# Patient Record
Sex: Female | Born: 1995 | Race: White | Hispanic: No | Marital: Married | Smoking: Never smoker
Health system: Southern US, Community
[De-identification: ages and names within clinical notes are randomized; demographics above are authoritative.]

## PROBLEM LIST (undated history)

## (undated) ENCOUNTER — Inpatient Hospital Stay (HOSPITAL_COMMUNITY): Payer: Self-pay

## (undated) DIAGNOSIS — F419 Anxiety disorder, unspecified: Secondary | ICD-10-CM

## (undated) DIAGNOSIS — N809 Endometriosis, unspecified: Secondary | ICD-10-CM

## (undated) DIAGNOSIS — O24419 Gestational diabetes mellitus in pregnancy, unspecified control: Secondary | ICD-10-CM

## (undated) DIAGNOSIS — H6690 Otitis media, unspecified, unspecified ear: Secondary | ICD-10-CM

## (undated) DIAGNOSIS — F32A Depression, unspecified: Secondary | ICD-10-CM

## (undated) DIAGNOSIS — R569 Unspecified convulsions: Secondary | ICD-10-CM

## (undated) DIAGNOSIS — G709 Myoneural disorder, unspecified: Secondary | ICD-10-CM

## (undated) DIAGNOSIS — T7840XA Allergy, unspecified, initial encounter: Secondary | ICD-10-CM

## (undated) HISTORY — DX: Allergy, unspecified, initial encounter: T78.40XA

## (undated) HISTORY — DX: Gestational diabetes mellitus in pregnancy, unspecified control: O24.419

## (undated) HISTORY — DX: Unspecified convulsions: R56.9

## (undated) HISTORY — PX: TONSILLECTOMY: SUR1361

## (undated) HISTORY — DX: Anxiety disorder, unspecified: F41.9

## (undated) HISTORY — DX: Otitis media, unspecified, unspecified ear: H66.90

## (undated) HISTORY — PX: CHOLECYSTECTOMY: SHX55

## (undated) HISTORY — DX: Myoneural disorder, unspecified: G70.9

## (undated) HISTORY — DX: Depression, unspecified: F32.A

---
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
## 2013-01-19 ENCOUNTER — Emergency Department: Payer: Self-pay | Admitting: Emergency Medicine

## 2013-01-19 LAB — URINALYSIS, COMPLETE
Blood: NEGATIVE
Glucose,UR: NEGATIVE mg/dL (ref 0–75)
Leukocyte Esterase: NEGATIVE
RBC,UR: NONE SEEN /HPF (ref 0–5)
Squamous Epithelial: <1

## 2013-01-19 LAB — COMPREHENSIVE METABOLIC PANEL
Albumin: 3.4 g/dL — ABNORMAL LOW (ref 3.8–5.6)
BUN: 4 mg/dL — ABNORMAL LOW (ref 9–21)
Bilirubin,Total: 0.4 mg/dL (ref 0.2–1.0)
Calcium, Total: 8.7 mg/dL — ABNORMAL LOW (ref 9.0–10.7)
Chloride: 104 mmol/L (ref 97–107)
Co2: 27 mmol/L — ABNORMAL HIGH (ref 16–25)
Creatinine: 0.48 mg/dL — ABNORMAL LOW (ref 0.60–1.30)
Glucose: 99 mg/dL (ref 65–99)
Osmolality: 269 (ref 275–301)
Potassium: 3.5 mmol/L (ref 3.3–4.7)
SGOT(AST): 18 U/L (ref 0–26)
SGPT (ALT): 21 U/L (ref 12–78)
Sodium: 136 mmol/L (ref 132–141)
Total Protein: 6.8 g/dL (ref 6.4–8.6)

## 2013-01-19 LAB — CBC
HCT: 39.9 % (ref 35.0–47.0)
HGB: 13.9 g/dL (ref 12.0–16.0)
MCH: 31.2 pg (ref 26.0–34.0)
Platelet: 145 10*3/uL — ABNORMAL LOW (ref 150–440)
RBC: 4.46 10*6/uL (ref 3.80–5.20)
RDW: 13.2 % (ref 11.5–14.5)

## 2013-01-19 LAB — LIPASE, BLOOD: Lipase: 69 U/L — ABNORMAL LOW (ref 73–393)

## 2013-01-19 LAB — PREGNANCY, URINE: Pregnancy Test, Urine: POSITIVE m[IU]/mL

## 2013-01-19 LAB — HCG, QUANTITATIVE, PREGNANCY: Beta Hcg, Quant.: 75780 m[IU]/mL — ABNORMAL HIGH

## 2013-07-03 ENCOUNTER — Ambulatory Visit (INDEPENDENT_AMBULATORY_CARE_PROVIDER_SITE_OTHER): Payer: Medicaid Other | Admitting: Pediatrics

## 2013-07-03 DIAGNOSIS — Z23 Encounter for immunization: Secondary | ICD-10-CM

## 2013-07-03 NOTE — Progress Notes (Signed)
Presented today for Tdap vaccine No new questions on vaccine. Was sent by her OB/GYN who says she needs to have the Tdap at [facility] pregnancy Mom was counseled on risks benefits of vaccine  and mom verbalized understanding. Handout (VIS) given for each vaccine.

## 2013-07-26 ENCOUNTER — Ambulatory Visit: Payer: Self-pay | Admitting: Obstetrics and Gynecology

## 2013-08-13 ENCOUNTER — Observation Stay: Payer: Self-pay | Admitting: Obstetrics and Gynecology

## 2013-08-23 ENCOUNTER — Inpatient Hospital Stay: Payer: Self-pay

## 2013-08-23 LAB — CBC WITH DIFFERENTIAL/PLATELET
HCT: 31 % — ABNORMAL LOW (ref 35.0–47.0)
HGB: 10.2 g/dL — ABNORMAL LOW (ref 12.0–16.0)
Lymphocyte #: 1.6 10*3/uL (ref 1.0–3.6)
Lymphocyte %: 21.9 %
MCV: 78 fL — ABNORMAL LOW (ref 80–100)
Monocyte #: 0.6 x10 3/mm (ref 0.2–0.9)
Monocyte %: 8.4 %
RBC: 4 10*6/uL (ref 3.80–5.20)
RDW: 15.7 % — ABNORMAL HIGH (ref 11.5–14.5)

## 2013-09-14 ENCOUNTER — Emergency Department: Payer: Self-pay | Admitting: Emergency Medicine

## 2013-09-14 LAB — URINALYSIS, COMPLETE
Bacteria: NONE SEEN
Bilirubin,UR: NEGATIVE
Blood: NEGATIVE
Ketone: NEGATIVE
Leukocyte Esterase: NEGATIVE
RBC,UR: 1 /HPF (ref 0–5)
Specific Gravity: 1.012 (ref 1.003–1.030)
Squamous Epithelial: 1

## 2013-09-14 LAB — CBC
HCT: 39.2 % (ref 35.0–47.0)
MCH: 26 pg (ref 26.0–34.0)
MCHC: 32.4 g/dL (ref 32.0–36.0)
RDW: 17.7 % — ABNORMAL HIGH (ref 11.5–14.5)
WBC: 5.7 10*3/uL (ref 3.6–11.0)

## 2013-09-14 LAB — COMPREHENSIVE METABOLIC PANEL
Albumin: 3.8 g/dL (ref 3.8–5.6)
Bilirubin,Total: 0.9 mg/dL (ref 0.2–1.0)
Calcium, Total: 9.2 mg/dL (ref 9.0–10.7)
Chloride: 108 mmol/L — ABNORMAL HIGH (ref 97–107)
Co2: 24 mmol/L (ref 16–25)
EGFR (African American): >60
EGFR (Non-African Amer.): >60
Osmolality: 274 (ref 275–301)
SGOT(AST): 118 U/L — ABNORMAL HIGH (ref 0–26)
SGPT (ALT): 63 U/L (ref 12–78)
Total Protein: 7.2 g/dL (ref 6.4–8.6)

## 2013-09-14 LAB — LIPASE, BLOOD: Lipase: 134 U/L (ref 73–393)

## 2013-09-19 ENCOUNTER — Observation Stay: Payer: Self-pay | Admitting: Surgery

## 2013-09-19 LAB — URINALYSIS, COMPLETE
Bilirubin,UR: NEGATIVE
Ketone: NEGATIVE
Protein: 30
RBC,UR: 3 /HPF (ref 0–5)
Squamous Epithelial: 6

## 2013-09-19 LAB — CBC
HCT: 36.1 % (ref 35.0–47.0)
HGB: 11.7 g/dL — ABNORMAL LOW (ref 12.0–16.0)
MCHC: 32.4 g/dL (ref 32.0–36.0)
MCV: 81 fL (ref 80–100)
RBC: 4.46 10*6/uL (ref 3.80–5.20)
RDW: 18.2 % — ABNORMAL HIGH (ref 11.5–14.5)
WBC: 6.8 10*3/uL (ref 3.6–11.0)

## 2013-09-19 LAB — COMPREHENSIVE METABOLIC PANEL
Albumin: 3.5 g/dL — ABNORMAL LOW (ref 3.8–5.6)
Alkaline Phosphatase: 220 U/L — ABNORMAL HIGH
Calcium, Total: 9.1 mg/dL (ref 9.0–10.7)
EGFR (African American): >60
EGFR (Non-African Amer.): >60
Osmolality: 279 (ref 275–301)
Potassium: 3.5 mmol/L (ref 3.3–4.7)
Total Protein: 6.6 g/dL (ref 6.4–8.6)

## 2014-01-11 ENCOUNTER — Telehealth: Payer: Self-pay

## 2014-01-11 NOTE — Telephone Encounter (Signed)
We would not be able to complete DMV form without seeing her. She would need new referral to be seen. If she is still taking seizure meds, she could ask whomever has been doing refills to complete the form. Or if not taking medication, she could ask that provider as well. But if she wants [place] to do it, we will need new referral to see her. Since she is 58, she could also be referred to adult neurology provider - she doesn't have to come back here since she is over 69 and it has been 4 years since she was seen. Thanks, Otila Kluver

## 2014-01-11 NOTE — Telephone Encounter (Signed)
Called pt and explained the below information. She said that she has not taken sz med in years. PCP was the last one to fill it, and was also the one who told her to stop the medication. I told her that PCP assumed responsibility and should be the one to complete forms. I told her that we would be happy to see her as a new patient, however, a new referral would need to be made. She is currently in college. She expressed understanding.

## 2014-01-11 NOTE — Telephone Encounter (Signed)
[name] lvm stating that she has not been seen in years, wanted to know if provider could fill out DMV Forms for her and if she needed an appt. She was last seen 03/26/10. She did not f/u in December 2011 as directed. Last note said that she was taking sz med. Last time refilled 03/26/10. Would she need to have the forms filled out by whoever has been filling the medication and seeing her? I she is still in school, wouldn't she need a new referral to be seen here bc it has been 4 years since she was last seen ? Please advise.

## 2014-10-06 IMAGING — US US OB < 14 WEEKS - US OB TV
1 series · 14 of 28 positions shown · non-contrast
Comparison: none

REASON FOR EXAM: pregnancy confirmed; vaginal bleeding
COMMENTS:   LMP: > one month ago

PROCEDURE:     US  - US OB LESS THAN 14 WEEKS/W TRANS  - December 21, 2012  [DATE]
RESULT:     Comparison: None.
TECHNIQUE: Multiple grayscale and color Doppler images were obtained of the
pelvis via transabdominal and endovaginal ultrasound.

[Series 1: us ob < 14 weeks - us ob tv · 0.30mm/px · 42 acquisitions, 14 frames shown]
[im 2/42]
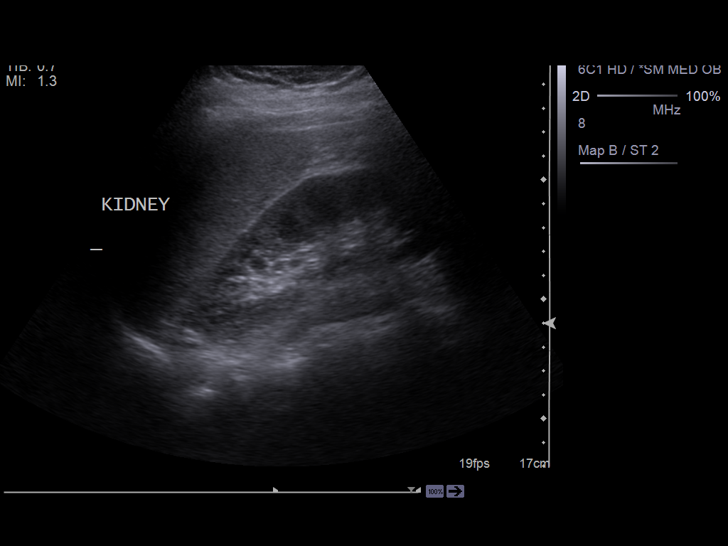
[im 5/42]
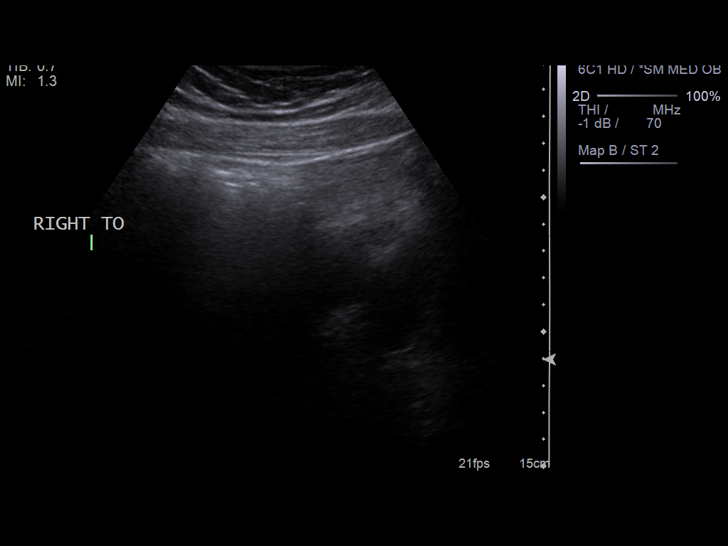
[im 8/42]
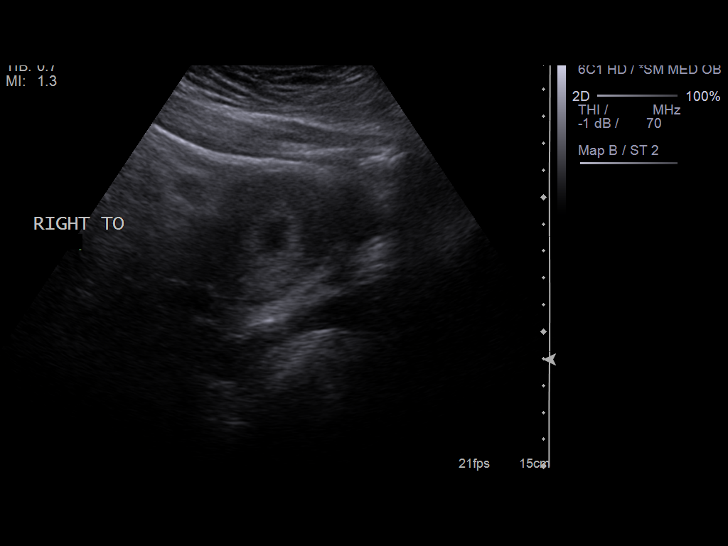
[im 11/42]
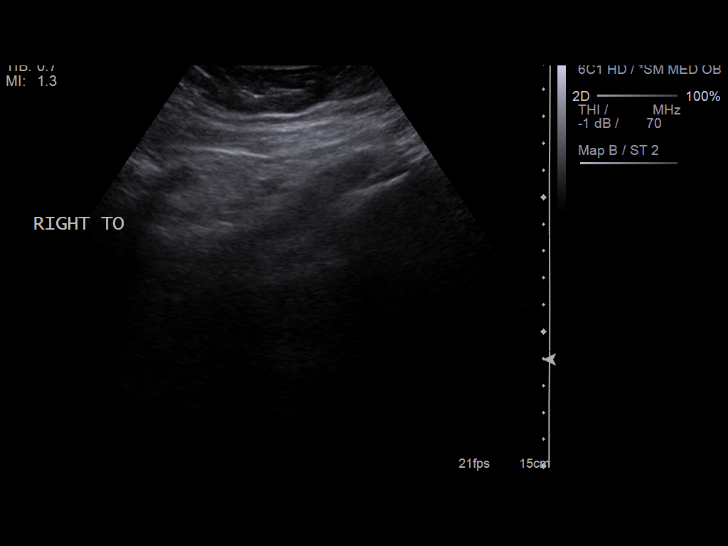
[im 14/42]
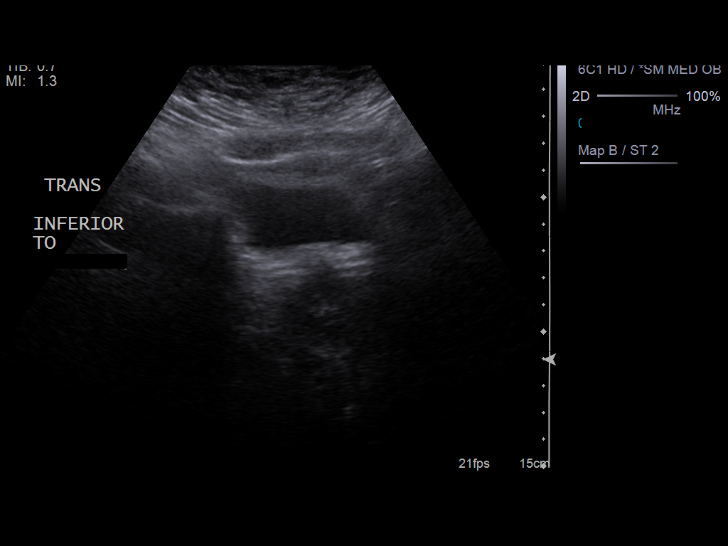
[im 17/42]
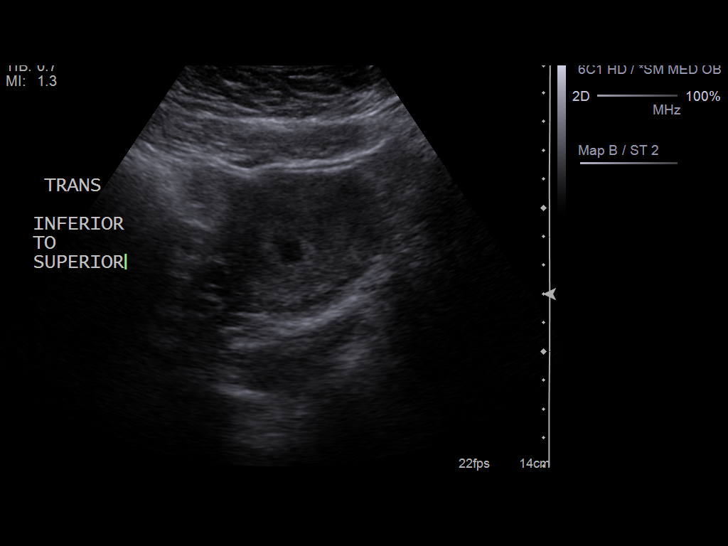
[im 20/42]
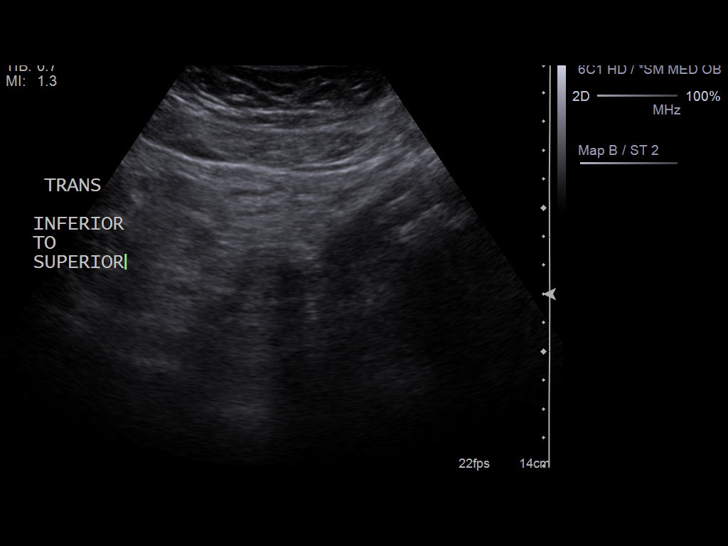
[im 23/42]
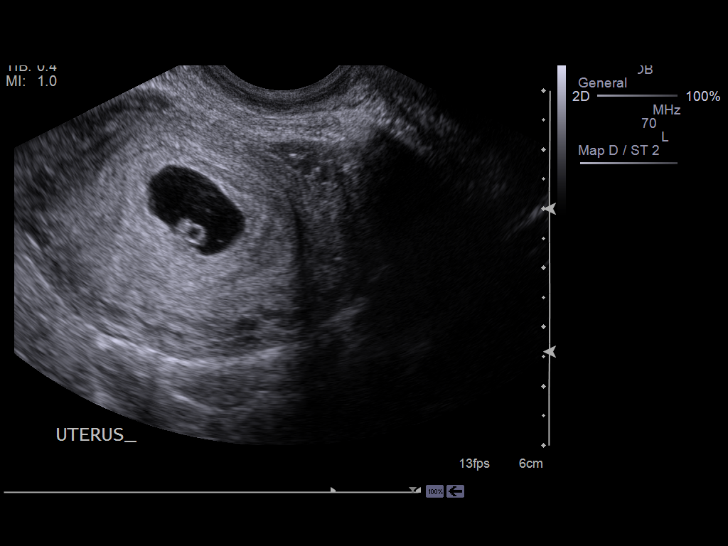
[im 26/42]
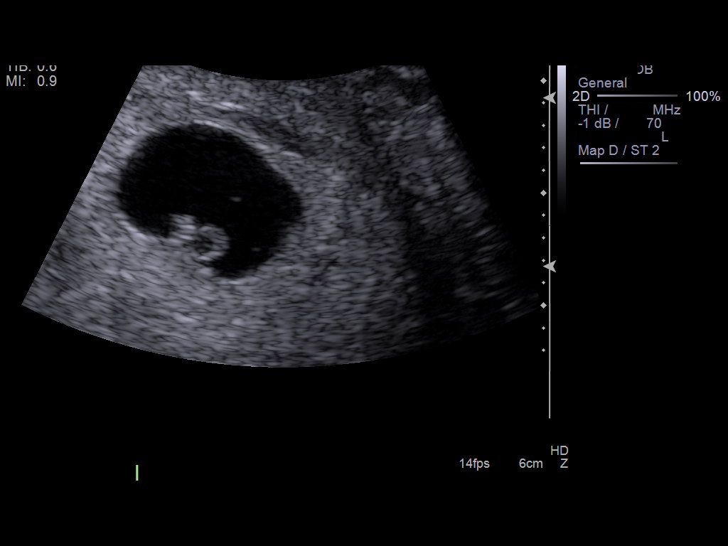
[im 29/42]
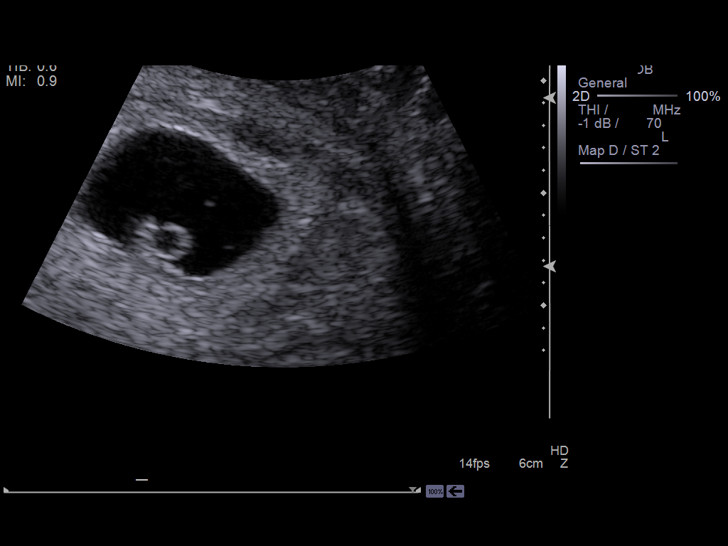
[im 32/42]
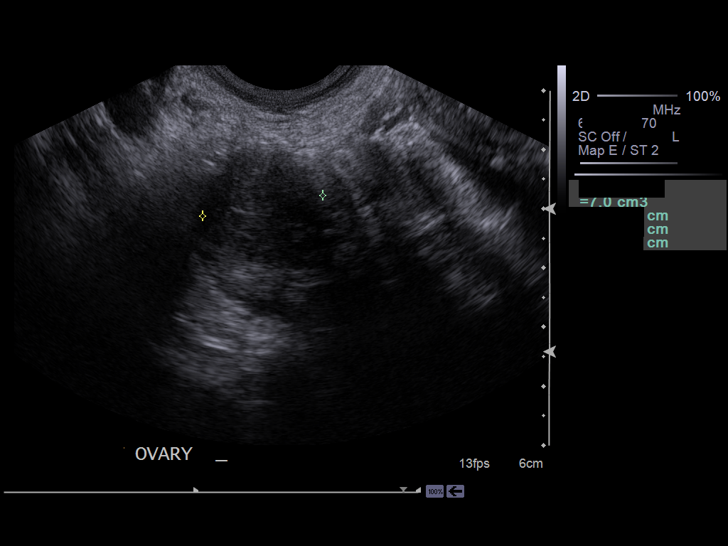
[im 35/42]
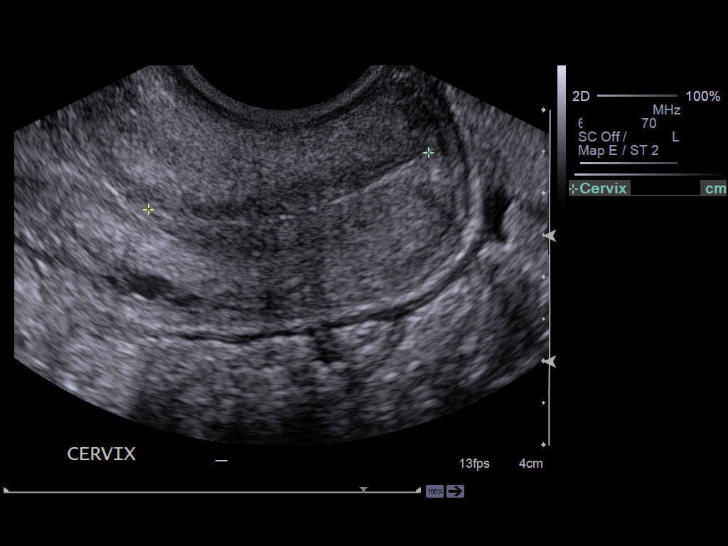
[im 38/42]
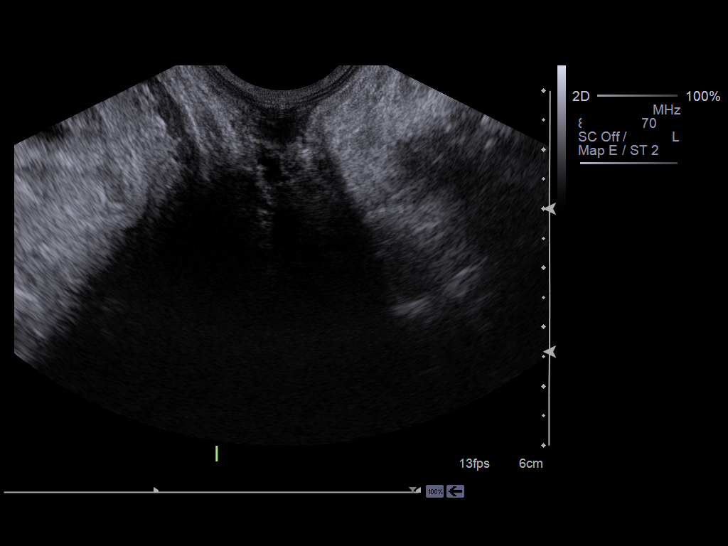
[im 42/42]
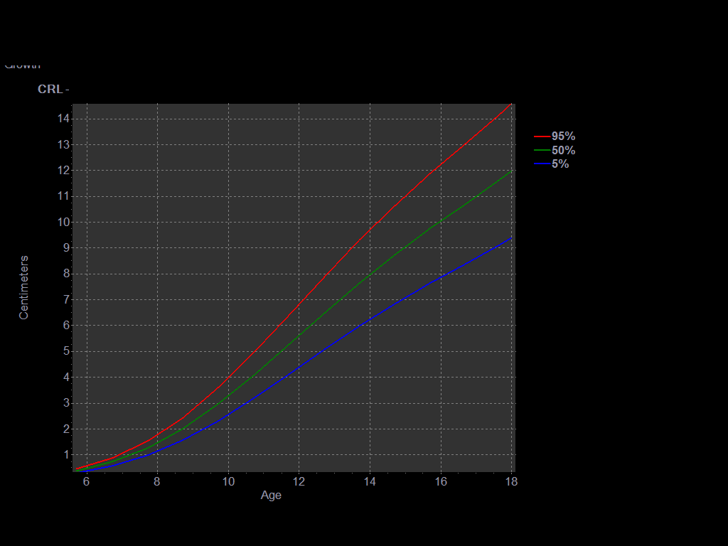

[14 of 28 positions shown; findings below may reference images not displayed]

FINDINGS: There is a gestational sac with a fetal pole and yolk sac within any of
canal. The crown-rump length of the fetal pole measures 0.35 cm, which
correlates with estimated gestational age of 6 weeks 0 days. Fetal heart
rate is 143 beats per minute.

The right ovary measures 3.0 x 2.1 x 2.1 cm. Doppler imaging was not
performed of the right ovary. The left ovary was not visualized.
IMPRESSION: 1. Single live intrauterine pregnancy with estimated gestational age of 6
weeks 0 days.
2. The left ovary was not visualized. Doppler imaging was not performed of
the right ovary. If there is clinical concern for ovarian torsion, Doppler
and spectral Doppler imaging could be performed.

## 2015-01-10 ENCOUNTER — Encounter: Payer: Self-pay | Admitting: Pediatrics

## 2015-02-01 NOTE — Op Note (Signed)
PATIENT NAME:  [name], [name] MR#:  [id number] DATE OF BIRTH:  [birth year]  DATE OF PROCEDURE:  09/19/2013  PREOPERATIVE DIAGNOSIS: Symptomatic cholelithiasis.   POSTOPERATIVE DIAGNOSIS: Symptomatic cholelithiasis.  PROCEDURE PERFORMED:  Laparoscopic cholecystectomy.   ANESTHESIA:  General.  ESTIMATED BLOOD LOSS:  10 mL.  COMPLICATIONS: None.   SPECIMEN: Gallbladder.  INDICATION FOR SURGERY:  Ms. [name] is a pleasant 19 year old with a recent history of recurrent epigastric right upper quadrant pain. She was found to have gallstones.   DETAILS OF PROCEDURE:  Following explanation of benefits and potential complications associated with surgery, informed consent was obtained.  Ms. [name] was then brought to the Operating Room suite. She was induced. Endotracheal tube was placed. General anesthesia was administered. Her abdomen was then prepped and draped in standard surgical fashion. A timeout was then performed identifying the patient name, operative site and procedure to be performed. A supraumbilical incision was made. It was deepened down to the fascia. The fascia was incised. Peritoneum was entered. Two stay sutures were placed through the fasciotomy. The abdomen was insufflated.  An 11 mm epigastric and two 5 mm subcostal trocars were placed in the midclavicular and anterior axillary line. The gallbladder was then lifted over the dome of the liver. The cystic artery and the cystic duct were dissected out. The critical view was obtained. These structures were then clipped 3 times and ligated. The gallbladder was then taken off the gallbladder fossa using cautery and brought out through the umbilical incision with an Endo Catch bag. The gallbladder fossa was then examined and noted to be hemostatic. The abdomen was then irrigated to remove all residual blood and bowel. Once I was happy with hemostasis, all trocars were removed under direct visualization. The supraumbilical trocar site was  closed with a figure-of-eight 0 Vicryl. The skin was then closed with 4-0 Monocryl deep dermal. Steri-Strips, Telfa gauze and Tegaderm were then placed over the wounds. The patient was then awoken, extubated and brought to postanesthesia care unit. There were no immediate complications. Needle, sponge and instrument counts were correct at the end of the procedure.    ____________________________ Glena Norfolk. Rayland Hamed, MD cal:ce D: 09/19/2013 16:56:39 ET T: 09/19/2013 18:51:52 ET JOB#: [id number]  cc: Harrell Gave A. Maliea Grandmaison, MD, <Dictator> Floyde Parkins MD ELECTRONICALLY SIGNED 09/21/2013 11:41

## 2015-02-01 NOTE — Op Note (Signed)
PATIENT NAME:  [name], [name] MR#:  [id number] DATE OF BIRTH:  May 27, 1996  DATE OF PROCEDURE:  08/27/2013  PREOPERATIVE DIAGNOSIS: Fetal intolerance to labor with large-for-gestational-age baby.   POSTOPERATIVE DIAGNOSIS:  1.  Fetal intolerance to labor with large-for-gestational-age baby.  2.  Meconium stained fluid.   PROCEDURE: Primary lower uterine transverse C-section.   SURGEON: Delsa Sale, M.D.   ASSISTANT: Louisa Second, CNM  FINDINGS: A 9 pound, 9 ounces female fetus, with dark meconium-stained fluid, normal uterus, tubes and ovaries bilaterally, with large varicosities in the uterine veins.   DESCRIPTION OF PROCEDURE: The patient was taken to the Operating Room and placed in supine position. After adequate spinal epidural was instilled, the patient was prepped and draped in the usual sterile fashion. Pfannenstiel skin incision was drawn and cut approximately 2 fingerbreadths above the pubic symphysis and carried sharply down to the fascia. Fascia was nicked in the midline and the incision was extended in a superolateral manner. The muscle bellies were sharply and bluntly dissected off the rectus fascia,  superiorly and inferiorly. The muscle bellies were identified, the midline was found, split open. The peritoneum was grasped, cut, entered.   The bladder blade was placed. The bladder flap was created. Uterine incision was made. The infant's head was delivered. Infant was bulb suctioned. Nuchal cord x1 was reduced. The remainder of the infant was delivered. Cord was clamped and cut. Pitocin was started. Infant was handed to the awaiting pediatrician. Placenta delivered. Uterus was delivered and wrapped in a moist laparotomy sponge. The interior of the uterus was curetted with a moist lap sponge.   The uterine incision was closed with a running locked 0 chromic suture and then a running imbricating 0 chromic suture. Bladder was tacked back up to the uterine incision with a  3-0 chromic suture in a running fashion. The belly was cleared of clots. The uterus was placed back into the abdomen after massage to make it firm. The gutters were then cleared with clear normal saline and laparotomy sponges to remove clots and excess fluid. The muscle bellies were grasped with Kelly clamps and closed with a running Vicryl suture, and reapproximated superiorly to inferiorly. The On-Q trocars were placed. The catheters were threaded and placed in between the rectus fascia and the rectus muscle.   The rectus fascia was closed with a running 0 Vicryl suture from angle to angle. Good hemostasis was identified. Plain gut suture was used to approximate the subcutaneous fat. Skin clips were placed along the skin edges. Dermabond was placed at the On-Q catheter. A 4 x 4 was placed to wrap the catheters in. These were then taped to the belly with snowflake pattern of Steri-Strips. The uterus was massaged and found to be firm. Clots expelled. Clear urine was noted in the Foley bag. The On-Q pump was primed. A bandage was placed, and the patient was taken to recovery after having tolerated the procedure well.       ____________________________ Delsa Sale, MD cck:cg D: 08/28/2013 00:21:58 ET T: 08/28/2013 03:09:37 ET JOB#: [id number]  cc: Delsa Sale, MD, <Dictator>  Delsa Sale MD ELECTRONICALLY SIGNED 08/28/2013 17:24

## 2015-02-01 NOTE — H&P (Signed)
   Subjective/Chief Complaint Epigastric/RUQ pain   History of Present Illness [name] is a pleasant 19 yo F who presents with acute onset epigastric and RUQ pain.  She says that it began this am and was 10/10.  Described it as stabbing.  Improved now.  Had similar episode 4 days ago and found to have gallstones.  Was to follow up with surgeon later this week.  Had recently had c section and feels that she is unable to care for her baby while having such pain.  + N/V.  Having regular BM.  On percocet for pain.  No fevers.  Mother had history of gallstones and had similar pain.   Past History C section laparoscopy for endometriosis h/o tonsillectomy H/o seizures   Past Med/Surgical Hx:  c section:   Migraine headaches:   Seizures:   Tonsillectomy:   ALLERGIES:  No Known Allergies:   Family and Social History:  Family History Hypertension  Diabetes Mellitus  Cancer  Breast cancer in multiple family members   Social History negative tobacco, negative ETOH, negative Illicit drugs   Place of Living [name], here with mother   Review of Systems:  Subjective/Chief Complaint Epigastric pain, nausea/vomiting   Fever/Chills No   Cough No   Sputum No   Abdominal Pain Yes   Diarrhea No   Constipation No   Nausea/Vomiting Yes   SOB/DOE No   Chest Pain No   Dysuria No   Tolerating Diet Nauseated  Vomiting   Physical Exam:  GEN well developed, no acute distress   HEENT pink conjunctivae, PERRL, good dentition   RESP clear BS  no use of accessory muscles   CARD regular rate  no murmur  no thrills   ABD denies tenderness  no hernia  soft  normal BS   EXTR negative cyanosis/clubbing, negative edema   SKIN normal to palpation, No rashes, No ulcers   NEURO cranial nerves intact, negative rigidity, negative tremor, follows commands   PSYCH A+O to time, place, person, good insight    Assessment/Admission Diagnosis [name] presents with epigastric pain and  gallstones on ultrasound.  I believe that her pain is biliary in nature and that she would benefit from cholecystectomy   Plan Admit, hydration, cholecystectomy   Electronic Signatures: Kamill Fulbright, Glena Norfolk (MD)  (Signed 09-Dec-14 08:00)  Authored: CHIEF COMPLAINT and HISTORY, PAST MEDICAL/SURGIAL HISTORY, ALLERGIES, FAMILY AND SOCIAL HISTORY, REVIEW OF SYSTEMS, PHYSICAL EXAM, ASSESSMENT AND PLAN   Last Updated: 09-Dec-14 08:00 by Floyde Parkins (MD)

## 2015-08-10 ENCOUNTER — Encounter: Payer: Self-pay | Admitting: Emergency Medicine

## 2015-08-10 ENCOUNTER — Emergency Department
Admission: EM | Admit: 2015-08-10 | Discharge: 2015-08-11 | Disposition: A | Discharge status: Home / Self Care | Payer: 59 | Attending: Emergency Medicine | Admitting: Emergency Medicine

## 2015-08-10 DIAGNOSIS — R102 Pelvic and perineal pain: Secondary | ICD-10-CM

## 2015-08-10 DIAGNOSIS — R1032 Left lower quadrant pain: Secondary | ICD-10-CM | POA: Diagnosis present

## 2015-08-10 DIAGNOSIS — N938 Other specified abnormal uterine and vaginal bleeding: Secondary | ICD-10-CM

## 2015-08-10 DIAGNOSIS — Z3202 Encounter for pregnancy test, result negative: Secondary | ICD-10-CM | POA: Insufficient documentation

## 2015-08-10 HISTORY — DX: Endometriosis, unspecified: N80.9

## 2015-08-10 LAB — BASIC METABOLIC PANEL
Anion gap: 4 — ABNORMAL LOW (ref 5–15)
BUN: 11 mg/dL (ref 6–20)
CALCIUM: 9.4 mg/dL (ref 8.9–10.3)
CHLORIDE: 108 mmol/L (ref 101–111)
CO2: 28 mmol/L (ref 22–32)
Creatinine, Ser: 0.69 mg/dL (ref 0.44–1.00)
GFR calc Af Amer: >60 mL/min (ref >60)
Glucose, Bld: 115 mg/dL — ABNORMAL HIGH (ref 65–99)
Potassium: 4.2 mmol/L (ref 3.5–5.1)
SODIUM: 140 mmol/L (ref 135–145)

## 2015-08-10 LAB — CBC WITH DIFFERENTIAL/PLATELET
Basophils Absolute: 0 10*3/uL (ref 0–0.1)
Basophils Relative: 1 %
EOS ABS: 0.1 10*3/uL (ref 0–0.7)
EOS PCT: 1 %
HCT: 42.9 % (ref 35.0–47.0)
Hemoglobin: 14.5 g/dL (ref 12.0–16.0)
LYMPHS ABS: 2.6 10*3/uL (ref 1.0–3.6)
Lymphocytes Relative: 42 %
MCH: 30 pg (ref 26.0–34.0)
MCHC: 33.8 g/dL (ref 32.0–36.0)
MCV: 88.7 fL (ref 80.0–100.0)
MONO ABS: 0.6 10*3/uL (ref 0.2–0.9)
MONOS PCT: 10 %
NEUTROS PCT: 46 %
Neutro Abs: 2.8 10*3/uL (ref 1.4–6.5)
PLATELETS: 165 10*3/uL (ref 150–440)
RBC: 4.84 MIL/uL (ref 3.80–5.20)
RDW: 12.7 % (ref 11.5–14.5)
WBC: 6 10*3/uL (ref 3.6–11.0)

## 2015-08-10 LAB — URINALYSIS COMPLETE WITH MICROSCOPIC (ARMC ONLY)
Bacteria, UA: NONE SEEN
Bilirubin Urine: NEGATIVE
Glucose, UA: NEGATIVE mg/dL
Hgb urine dipstick: NEGATIVE
Leukocytes, UA: NEGATIVE
Nitrite: NEGATIVE
PROTEIN: NEGATIVE mg/dL
Specific Gravity, Urine: 1.028 (ref 1.005–1.030)
pH: 5 (ref 5.0–8.0)

## 2015-08-10 LAB — PREGNANCY, URINE: Preg Test, Ur: NEGATIVE

## 2015-08-10 NOTE — ED Notes (Signed)
Pt thinks she's had a miscarriage; no confirmed pregnancy; last night passed several large blood clots vaginally; no longer bleeding; says she has an IUD but has "had issues"; usually has menstrual cycle monthly but had not had one for 2 1/2 months; c/o left lower abd pain "near my ovary"; c/o low midline abd pain; pt talking in complete coherent sentences

## 2015-08-10 NOTE — ED Notes (Signed)
Pt unable to void at this time. 

## 2015-08-11 ENCOUNTER — Emergency Department: Payer: 59

## 2015-08-11 LAB — WET PREP, GENITAL
Clue Cells Wet Prep HPF POC: NONE SEEN
Trich, Wet Prep: NONE SEEN

## 2015-08-11 LAB — CHLAMYDIA/NGC RT PCR (ARMC ONLY)
Chlamydia Tr: NOT DETECTED
N gonorrhoeae: NOT DETECTED

## 2015-08-11 MED ORDER — OXYCODONE-ACETAMINOPHEN 5-325 MG PO TABS
1.0000 | ORAL_TABLET | Freq: Once | ORAL | Status: AC
  Administered 2015-08-11: 1 via ORAL
  Filled 2015-08-11: qty 1

## 2015-08-11 NOTE — ED Notes (Signed)
Patient transported to US via stretcher.

## 2015-08-11 NOTE — ED Provider Notes (Signed)
[facility] Emergency Department Provider Note  ____________________________________________  Time seen: Approximately 12:05 AM  I have reviewed the triage vital signs and the nursing notes.   HISTORY  Chief Complaint Abdominal Pain    HPI [name] is a 19 y.o. female with an IUD who is presenting today with vaginal bleeding and left sided pelvic pain. She says that the pain started yesterday when she had 4/2 dollar sized blood clots. She says that she has not had any bleeding since. She says the pain is intermittent and her pain is only a 3 out of 10 at this time. She describes as cramping and sharp in quality. Denies any vaginal discharge. Says that she has had multiple episodes of cramping and bleeding with her IUD in the past. She said she last month at [place] [facility] ob/[facility] and the iud was in the right place in the uterus but she said that it was unsure if it had begun a rotating into the musculature of the uterus. she was offered but does not want any pain medication at this time. denies any nausea, vomiting or diarrhea.   Past Medical History  Diagnosis Date  . Otitis media   . Seizures ([place])   . Endometriosis     Patient Active Problem List   Diagnosis Date Noted  . Chronic pelvic pain in female 02/01/2012    Past Surgical History  Procedure Laterality Date  . Cesarean section    . Cholecystectomy    . Tonsillectomy      No current outpatient prescriptions on file.  Allergies Mold extract and Pollen extract  History reviewed. No pertinent family history.  Social History Social History  Substance Use Topics  . Smoking status: Never Smoker   . Smokeless tobacco: Never Used  . Alcohol Use: No    Review of Systems Constitutional: No fever/chills Eyes: No visual changes. ENT: No sore throat. Cardiovascular: Denies chest pain. Respiratory: Denies shortness of breath. Gastrointestinal:   No nausea, no vomiting.  No diarrhea.  No  constipation. Genitourinary: Negative for dysuria. Musculoskeletal: Negative for back pain. Skin: Negative for rash. Neurological: Negative for headaches, focal weakness or numbness.  10-point ROS otherwise negative.  ____________________________________________   PHYSICAL EXAM:  VITAL SIGNS: ED Triage Vitals  Enc Vitals Group     BP 08/10/15 2230 106/69 mmHg     Pulse Rate 08/10/15 2230 63     Resp 08/10/15 2230 18     Temp 08/10/15 2230 97.8 F (36.6 C)     Temp Source 08/10/15 2230 Oral     SpO2 08/10/15 2230 99 %     Weight 08/10/15 2230 200 lb (90.719 kg)     Height 08/10/15 2230 5\' 3"  (1.6 m)     Head Cir --      Peak Flow --      Pain Score 08/10/15 2232 6     Pain Loc --      Pain Edu? --      Excl. in [place]? --     Constitutional: Alert and oriented. Well appearing and in no acute distress. Eyes: Conjunctivae are normal. PERRL. EOMI. Head: Atraumatic. Nose: No congestion/rhinnorhea. Mouth/Throat: Mucous membranes are moist.  Oropharynx non-erythematous. Neck: No stridor.   Cardiovascular: Normal rate, regular rhythm. Grossly normal heart sounds.  Good peripheral circulation. Respiratory: Normal respiratory effort.  No retractions. Lungs CTAB. Gastrointestinal: Soft with suprapubic as well as left lower quadrant abdominal tenderness. The tenderness is mild to moderate. There  is no rebound or guarding. There is no tenderness over McBurney's point.. No distention. No abdominal bruits. No CVA tenderness. Genitourinary: Normal external appearance. There is no active bleeding observed on speculum exam. I was not able to see the strings from the cervix of the IUD however the patient says that they were cut short. Positive for mild cervical motion tenderness.  Uterine tenderness as well as left lower adnexal tenderness without masses palpated. No tenderness to the right adnexa or masses palpated. Musculoskeletal: No lower extremity tenderness nor edema.  No joint  effusions. Neurologic:  Normal speech and language. No gross focal neurologic deficits are appreciated. No gait instability. Skin:  Skin is warm, dry and intact. No rash noted. Psychiatric: Mood and affect are normal. Speech and behavior are normal.  ____________________________________________   LABS (all labs ordered are listed, but only abnormal results are displayed)  Labs Reviewed  WET PREP, GENITAL - Abnormal; Notable for the following:    Yeast Wet Prep HPF POC FEW (*)    WBC, Wet Prep HPF POC FEW (*)    All other components within normal limits  BASIC METABOLIC PANEL - Abnormal; Notable for the following:    Glucose, Bld 115 (*)    Anion gap 4 (*)    All other components within normal limits  URINALYSIS COMPLETEWITH MICROSCOPIC ([facility] ONLY) - Abnormal; Notable for the following:    Color, Urine YELLOW (*)    APPearance CLEAR (*)    Ketones, ur TRACE (*)    Squamous Epithelial / LPF 0-5 (*)    All other components within normal limits  CHLAMYDIA/NGC RT PCR ([facility] ONLY)  CBC WITH DIFFERENTIAL/PLATELET  PREGNANCY, URINE   ____________________________________________  EKG   ____________________________________________  RADIOLOGY  Intrauterine device position within the endometrium. No pelvic masses. No evidence of torsion. ____________________________________________   PROCEDURES   ____________________________________________   INITIAL IMPRESSION / ASSESSMENT AND PLAN / ED COURSE  Pertinent labs & imaging results that were available during my care of the patient were reviewed by me and considered in my medical decision making (see chart for details).  ----------------------------------------- 2:35 AM on 08/11/2015 -----------------------------------------  Patient is resting comfortably. Updated the patient as well as her husband about the lab as well as imaging results. She will follow-up with OB/GYN at [facility] side where she has been seen  previously. ____________________________________________   FINAL CLINICAL IMPRESSION(S) / ED DIAGNOSES  Final diagnoses:  Pelvic pain in female  Pelvic pain in female      Orbie Pyo, MD 08/11/15 [phone]

## 2016-08-11 DIAGNOSIS — M545 Low back pain: Secondary | ICD-10-CM | POA: Diagnosis not present

## 2016-08-11 DIAGNOSIS — M9903 Segmental and somatic dysfunction of lumbar region: Secondary | ICD-10-CM | POA: Diagnosis not present

## 2016-08-11 DIAGNOSIS — M9902 Segmental and somatic dysfunction of thoracic region: Secondary | ICD-10-CM | POA: Diagnosis not present

## 2016-08-11 DIAGNOSIS — M9901 Segmental and somatic dysfunction of cervical region: Secondary | ICD-10-CM | POA: Diagnosis not present

## 2016-08-11 DIAGNOSIS — M546 Pain in thoracic spine: Secondary | ICD-10-CM | POA: Diagnosis not present

## 2016-08-11 DIAGNOSIS — M542 Cervicalgia: Secondary | ICD-10-CM | POA: Diagnosis not present

## 2017-04-07 DIAGNOSIS — Z30432 Encounter for removal of intrauterine contraceptive device: Secondary | ICD-10-CM | POA: Diagnosis not present

## 2017-04-15 DIAGNOSIS — Z30432 Encounter for removal of intrauterine contraceptive device: Secondary | ICD-10-CM | POA: Diagnosis not present

## 2017-06-29 DIAGNOSIS — O09291 Supervision of pregnancy with other poor reproductive or obstetric history, first trimester: Secondary | ICD-10-CM | POA: Diagnosis not present

## 2017-06-29 DIAGNOSIS — Z3201 Encounter for pregnancy test, result positive: Secondary | ICD-10-CM | POA: Diagnosis not present

## 2017-06-29 DIAGNOSIS — Z3A01 Less than 8 weeks gestation of pregnancy: Secondary | ICD-10-CM | POA: Diagnosis not present

## 2017-07-01 DIAGNOSIS — O09299 Supervision of pregnancy with other poor reproductive or obstetric history, unspecified trimester: Secondary | ICD-10-CM | POA: Diagnosis not present

## 2017-07-01 DIAGNOSIS — Z3A01 Less than 8 weeks gestation of pregnancy: Secondary | ICD-10-CM | POA: Diagnosis not present

## 2017-07-05 DIAGNOSIS — Z3A01 Less than 8 weeks gestation of pregnancy: Secondary | ICD-10-CM | POA: Diagnosis not present

## 2017-07-05 DIAGNOSIS — O09291 Supervision of pregnancy with other poor reproductive or obstetric history, first trimester: Secondary | ICD-10-CM | POA: Diagnosis not present

## 2017-07-22 DIAGNOSIS — Z3201 Encounter for pregnancy test, result positive: Secondary | ICD-10-CM | POA: Diagnosis not present

## 2017-08-10 DIAGNOSIS — Z3A09 9 weeks gestation of pregnancy: Secondary | ICD-10-CM | POA: Diagnosis not present

## 2017-08-10 DIAGNOSIS — O09299 Supervision of pregnancy with other poor reproductive or obstetric history, unspecified trimester: Secondary | ICD-10-CM | POA: Diagnosis not present

## 2017-08-10 DIAGNOSIS — Z3689 Encounter for other specified antenatal screening: Secondary | ICD-10-CM | POA: Diagnosis not present

## 2017-08-10 DIAGNOSIS — O09291 Supervision of pregnancy with other poor reproductive or obstetric history, first trimester: Secondary | ICD-10-CM | POA: Diagnosis not present

## 2017-08-10 LAB — OB RESULTS CONSOLE ABO/RH: RH Type: POSITIVE

## 2017-08-10 LAB — OB RESULTS CONSOLE RPR: RPR: NONREACTIVE

## 2017-08-10 LAB — OB RESULTS CONSOLE ANTIBODY SCREEN: Antibody Screen: NEGATIVE

## 2017-08-10 LAB — OB RESULTS CONSOLE RUBELLA ANTIBODY, IGM: RUBELLA: IMMUNE

## 2017-08-10 LAB — OB RESULTS CONSOLE HEPATITIS B SURFACE ANTIGEN: Hepatitis B Surface Ag: NEGATIVE

## 2017-08-10 LAB — OB RESULTS CONSOLE HIV ANTIBODY (ROUTINE TESTING): HIV: NONREACTIVE

## 2017-08-10 LAB — OB RESULTS CONSOLE GC/CHLAMYDIA
CHLAMYDIA, DNA PROBE: NEGATIVE
GC PROBE AMP, GENITAL: NEGATIVE

## 2017-09-07 DIAGNOSIS — O09291 Supervision of pregnancy with other poor reproductive or obstetric history, first trimester: Secondary | ICD-10-CM | POA: Diagnosis not present

## 2017-09-07 DIAGNOSIS — Z3A13 13 weeks gestation of pregnancy: Secondary | ICD-10-CM | POA: Diagnosis not present

## 2017-09-14 DIAGNOSIS — O99810 Abnormal glucose complicating pregnancy: Secondary | ICD-10-CM | POA: Diagnosis not present

## 2017-09-14 DIAGNOSIS — Z3A14 14 weeks gestation of pregnancy: Secondary | ICD-10-CM | POA: Diagnosis not present

## 2017-09-22 ENCOUNTER — Encounter: Payer: 59 | Attending: Obstetrics and Gynecology | Admitting: Registered"

## 2017-09-22 DIAGNOSIS — O24419 Gestational diabetes mellitus in pregnancy, unspecified control: Secondary | ICD-10-CM | POA: Diagnosis not present

## 2017-09-22 DIAGNOSIS — R7309 Other abnormal glucose: Secondary | ICD-10-CM

## 2017-09-23 ENCOUNTER — Encounter: Payer: Self-pay | Admitting: Registered"

## 2017-09-23 DIAGNOSIS — R7309 Other abnormal glucose: Secondary | ICD-10-CM | POA: Insufficient documentation

## 2017-09-23 NOTE — Progress Notes (Signed)
Patient was seen on 09/22/17 for Gestational Diabetes self-management class at [facility]. The following learning objectives were met by the patient during this course:   States the definition of Gestational Diabetes  States why dietary management is important in controlling blood glucose  Describes the effects each nutrient has on blood glucose levels  Demonstrates ability to create a balanced meal plan  Demonstrates carbohydrate counting   States when to check blood glucose levels  Demonstrates proper blood glucose monitoring techniques  States the effect of stress and exercise on blood glucose levels  States the importance of limiting caffeine and abstaining from alcohol and smoking  Blood glucose monitor given: none - has started checking BG prior to class Lot # [id number] Exp: n/a Blood glucose reading: n/a  Patient instructed to monitor glucose levels: FBS: 60 - <95 1 hour: <140 2 hour: <120  Patient received handouts:  Nutrition Diabetes and Pregnancy  Carbohydrate Counting List  Patient will be seen for follow-up as needed.

## 2017-09-28 DIAGNOSIS — O24419 Gestational diabetes mellitus in pregnancy, unspecified control: Secondary | ICD-10-CM | POA: Diagnosis not present

## 2017-09-28 DIAGNOSIS — Z3A16 16 weeks gestation of pregnancy: Secondary | ICD-10-CM | POA: Diagnosis not present

## 2017-09-29 DIAGNOSIS — O24419 Gestational diabetes mellitus in pregnancy, unspecified control: Secondary | ICD-10-CM | POA: Diagnosis not present

## 2017-09-29 DIAGNOSIS — Z3A16 16 weeks gestation of pregnancy: Secondary | ICD-10-CM | POA: Diagnosis not present

## 2017-10-01 DIAGNOSIS — M545 Low back pain: Secondary | ICD-10-CM | POA: Diagnosis not present

## 2017-10-01 DIAGNOSIS — M9903 Segmental and somatic dysfunction of lumbar region: Secondary | ICD-10-CM | POA: Diagnosis not present

## 2017-10-01 DIAGNOSIS — M542 Cervicalgia: Secondary | ICD-10-CM | POA: Diagnosis not present

## 2017-10-01 DIAGNOSIS — M9902 Segmental and somatic dysfunction of thoracic region: Secondary | ICD-10-CM | POA: Diagnosis not present

## 2017-10-01 DIAGNOSIS — M9901 Segmental and somatic dysfunction of cervical region: Secondary | ICD-10-CM | POA: Diagnosis not present

## 2017-10-01 DIAGNOSIS — M546 Pain in thoracic spine: Secondary | ICD-10-CM | POA: Diagnosis not present

## 2017-10-06 DIAGNOSIS — M546 Pain in thoracic spine: Secondary | ICD-10-CM | POA: Diagnosis not present

## 2017-10-06 DIAGNOSIS — M542 Cervicalgia: Secondary | ICD-10-CM | POA: Diagnosis not present

## 2017-10-06 DIAGNOSIS — M545 Low back pain: Secondary | ICD-10-CM | POA: Diagnosis not present

## 2017-10-06 DIAGNOSIS — M9902 Segmental and somatic dysfunction of thoracic region: Secondary | ICD-10-CM | POA: Diagnosis not present

## 2017-10-06 DIAGNOSIS — M9903 Segmental and somatic dysfunction of lumbar region: Secondary | ICD-10-CM | POA: Diagnosis not present

## 2017-10-06 DIAGNOSIS — M9901 Segmental and somatic dysfunction of cervical region: Secondary | ICD-10-CM | POA: Diagnosis not present

## 2017-10-20 DIAGNOSIS — Z136 Encounter for screening for cardiovascular disorders: Secondary | ICD-10-CM | POA: Diagnosis not present

## 2017-10-21 DIAGNOSIS — O358XX0 Maternal care for other (suspected) fetal abnormality and damage, not applicable or unspecified: Secondary | ICD-10-CM | POA: Diagnosis not present

## 2017-10-21 DIAGNOSIS — Z361 Encounter for antenatal screening for raised alphafetoprotein level: Secondary | ICD-10-CM | POA: Diagnosis not present

## 2017-10-21 DIAGNOSIS — R740 Nonspecific elevation of levels of transaminase and lactic acid dehydrogenase [LDH]: Secondary | ICD-10-CM | POA: Diagnosis not present

## 2017-10-21 DIAGNOSIS — Z3A20 20 weeks gestation of pregnancy: Secondary | ICD-10-CM | POA: Diagnosis not present

## 2017-10-21 DIAGNOSIS — O24419 Gestational diabetes mellitus in pregnancy, unspecified control: Secondary | ICD-10-CM | POA: Diagnosis not present

## 2017-10-21 DIAGNOSIS — D696 Thrombocytopenia, unspecified: Secondary | ICD-10-CM | POA: Diagnosis not present

## 2017-10-26 ENCOUNTER — Inpatient Hospital Stay (HOSPITAL_COMMUNITY)
Admission: AD | Admit: 2017-10-26 | Discharge: 2017-10-26 | Disposition: A | Discharge status: Home / Self Care | Payer: 59 | Source: Ambulatory Visit | Attending: Obstetrics | Admitting: Obstetrics

## 2017-10-26 ENCOUNTER — Encounter (HOSPITAL_COMMUNITY): Payer: Self-pay

## 2017-10-26 DIAGNOSIS — O4702 False labor before 37 completed weeks of gestation, second trimester: Secondary | ICD-10-CM | POA: Insufficient documentation

## 2017-10-26 DIAGNOSIS — O479 False labor, unspecified: Secondary | ICD-10-CM

## 2017-10-26 DIAGNOSIS — Z3A20 20 weeks gestation of pregnancy: Secondary | ICD-10-CM | POA: Diagnosis not present

## 2017-10-26 LAB — URINALYSIS, ROUTINE W REFLEX MICROSCOPIC
BILIRUBIN URINE: NEGATIVE
Glucose, UA: NEGATIVE mg/dL
HGB URINE DIPSTICK: NEGATIVE
Ketones, ur: NEGATIVE mg/dL
Leukocytes, UA: NEGATIVE
Nitrite: NEGATIVE
PROTEIN: NEGATIVE mg/dL
Specific Gravity, Urine: 1.019 (ref 1.005–1.030)
pH: 5 (ref 5.0–8.0)

## 2017-10-26 NOTE — Discharge Instructions (Signed)
Braxton Hicks Contractions °Contractions of the uterus can occur throughout pregnancy, but they are not always a sign that you are in labor. You may have practice contractions called Braxton Hicks contractions. These false labor contractions are sometimes confused with true labor. °What are Braxton Hicks contractions? °Braxton Hicks contractions are tightening movements that occur in the muscles of the uterus before labor. Unlike true labor contractions, these contractions do not result in opening (dilation) and thinning of the cervix. Toward the end of pregnancy (32-34 weeks), Braxton Hicks contractions can happen more often and may become stronger. These contractions are sometimes difficult to tell apart from true labor because they can be very uncomfortable. You should not feel embarrassed if you go to the hospital with false labor. °Sometimes, the only way to tell if you are in true labor is for your health care provider to look for changes in the cervix. The health care provider will do a physical exam and may monitor your contractions. If you are not in true labor, the exam should show that your cervix is not dilating and your water has not broken. °If there are other health problems associated with your pregnancy, it is completely safe for you to be sent home with false labor. You may continue to have Braxton Hicks contractions until you go into true labor. °How to tell the difference between true labor and false labor °True labor °· Contractions last 30-70 seconds. °· Contractions become very regular. °· Discomfort is usually felt in the top of the uterus, and it spreads to the lower abdomen and low back. °· Contractions do not go away with walking. °· Contractions usually become more intense and increase in frequency. °· The cervix dilates and gets thinner. °False labor °· Contractions are usually shorter and not as strong as true labor contractions. °· Contractions are usually irregular. °· Contractions  are often felt in the front of the lower abdomen and in the groin. °· Contractions may go away when you walk around or change positions while lying down. °· Contractions get weaker and are shorter-lasting as time goes on. °· The cervix usually does not dilate or become thin. °Follow these instructions at home: °· Take over-the-counter and prescription medicines only as told by your health care provider. °· Keep up with your usual exercises and follow other instructions from your health care provider. °· Eat and drink lightly if you think you are going into labor. °· If Braxton Hicks contractions are making you uncomfortable: °? Change your position from lying down or resting to walking, or change from walking to resting. °? Sit and rest in a tub of warm water. °? Drink enough fluid to keep your urine pale yellow. Dehydration may cause these contractions. °? Do slow and deep breathing several times an hour. °· Keep all follow-up prenatal visits as told by your health care provider. This is important. °Contact a health care provider if: °· You have a fever. °· You have continuous pain in your abdomen. °Get help right away if: °· Your contractions become stronger, more regular, and closer together. °· You have fluid leaking or gushing from your vagina. °· You pass blood-tinged mucus (bloody show). °· You have bleeding from your vagina. °· You have low back pain that you never had before. °· You feel your baby’s head pushing down and causing pelvic pressure. °· Your baby is not moving inside you as much as it used to. °Summary °· Contractions that occur before labor are called Braxton   Hicks contractions, false labor, or practice contractions. °· Braxton Hicks contractions are usually shorter, weaker, farther apart, and less regular than true labor contractions. True labor contractions usually become progressively stronger and regular and they become more frequent. °· Manage discomfort from Braxton Hicks contractions by  changing position, resting in a warm bath, drinking plenty of water, or practicing deep breathing. °This information is not intended to replace advice given to you by your health care provider. Make sure you discuss any questions you have with your health care provider. °Document Released: 02/11/2017 Document Revised: 02/11/2017 Document Reviewed: 02/11/2017 °Elsevier Interactive Patient Education © 2018 [employer] ° °

## 2017-10-26 NOTE — MAU Provider Note (Signed)
History     CSN: [id number]  Arrival date and time: 10/26/17 8295   First Provider Initiated Contact with Patient 10/26/17 2003     Chief Complaint  Patient presents with  . Contractions   HPI [name] is a 22 y.o. G3P1011 at [redacted]w[redacted]d who presents with 2 contractions. She states both times she felt tightening in her back and abdomen that lasted 5 minutes a piece. She denies any more tightening or abdominal pain now. She denies any vaginal bleeding or discharge. Denies leaking of fluid. Reports good fetal movement.  OB History    Gravida Para Term Preterm AB Living   3 1 1   1 1    SAB TAB Ectopic Multiple Live Births   1       1      Past Medical History:  Diagnosis Date  . Endometriosis   . Otitis media   . Seizures ([place])     Past Surgical History:  Procedure Laterality Date  . CESAREAN SECTION    . CHOLECYSTECTOMY    . LAPAROSCOPY  2012   diagnose endometriosis  . TONSILLECTOMY      Family History  Family history unknown: Yes    Social History   Tobacco Use  . Smoking status: Never Smoker  . Smokeless tobacco: Never Used  Substance Use Topics  . Alcohol use: No  . Drug use: No    Allergies:  Allergies  Allergen Reactions  . Aspirin   . Mold Extract [Trichophyton Mentagrophyte]   . Pollen Extract   . Shellfish Allergy     Medications Prior to Admission  Medication Sig Dispense Refill Last Dose  . acetaminophen (TYLENOL) 325 MG tablet Take 650 mg by mouth every 6 (six) hours as needed for moderate pain.   Past Week at Unknown time  . Prenatal Vit-Fe Fumarate-FA (PRENATAL MULTIVITAMIN) TABS tablet Take 1 tablet by mouth daily at 12 noon.   10/26/2017 at Unknown time  . promethazine (PHENERGAN) 12.5 MG tablet Take 12.5 mg by mouth every 6 (six) hours as needed for nausea or vomiting.   Past Week at Unknown time    Review of Systems  Constitutional: Negative.  Negative for fatigue and fever.  HENT: Negative.   Respiratory: Negative.  Negative  for shortness of breath.   Cardiovascular: Negative.  Negative for chest pain.  Gastrointestinal: Positive for abdominal pain. Negative for constipation, diarrhea, nausea and vomiting.  Genitourinary: Negative.  Negative for dysuria, vaginal bleeding and vaginal discharge.  Neurological: Negative.  Negative for dizziness and headaches.   Physical Exam   Blood pressure 123/63, pulse 72, temperature 98.3 F (36.8 C), temperature source Oral, resp. rate 18, height 5\' 3"  (1.6 m), weight 206 lb 1.9 oz (93.5 kg).  Physical Exam  Nursing note and vitals reviewed. Constitutional: She is oriented to person, place, and time. She appears well-developed and well-nourished. No distress.  HENT:  Head: Normocephalic.  Eyes: Pupils are equal, round, and reactive to light.  Cardiovascular: Normal rate, regular rhythm and normal heart sounds.  Respiratory: Effort normal and breath sounds normal. No respiratory distress.  GI: Soft. Bowel sounds are normal. She exhibits no distension. There is no tenderness.  Neurological: She is alert and oriented to person, place, and time.  Skin: Skin is warm and dry.  Psychiatric: She has a normal mood and affect. Her behavior is normal. Judgment and thought content normal.   Dilation: Closed Effacement (%): Thick Cervical Position: Posterior Exam by:: Samuel Mcpeek neil cnm  FHT: 149 bpm  MAU Course  Procedures Results for orders placed or performed during the hospital encounter of 10/26/17 (from the past 24 hour(s))  Urinalysis, Routine w reflex microscopic     Status: None   Collection Time: 10/26/17  7:15 PM  Result Value Ref Range   Color, Urine YELLOW YELLOW   APPearance CLEAR CLEAR   Specific Gravity, Urine 1.019 1.005 - 1.030   pH 5.0 5.0 - 8.0   Glucose, UA NEGATIVE NEGATIVE mg/dL   Hgb urine dipstick NEGATIVE NEGATIVE   Bilirubin Urine NEGATIVE NEGATIVE   Ketones, ur NEGATIVE NEGATIVE mg/dL   Protein, ur NEGATIVE NEGATIVE mg/dL   Nitrite NEGATIVE  NEGATIVE   Leukocytes, UA NEGATIVE NEGATIVE   MDM UA Discussed with Dr. Pamala Hurry- ok to discharge patient home Assessment and Plan   1. Braxton Hicks contractions   2. [redacted] weeks gestation of pregnancy    -Discharge home in stable condition -Preterm labor precautions discussed -Patient advised to follow-up with [facility] as scheduled for prenatal care -Patient may return to MAU as needed or if her condition were to change or worsen  Wende Mott CNM 10/26/2017, 8:04 PM

## 2017-10-26 NOTE — MAU Note (Signed)
Pt presents to MAU c/o ctxs that started @ 1830. Pt reports that she has had only 1- 2 ctxs that have lasted about 53min a piece. Pt also reports a right lower abdominal pain that started last night, pt however thought she may have overexerted herself but the pain has continued today. Pt reports good FM. Pt denies bleeding or LOF.

## 2017-10-28 DIAGNOSIS — O24410 Gestational diabetes mellitus in pregnancy, diet controlled: Secondary | ICD-10-CM | POA: Diagnosis not present

## 2017-10-28 DIAGNOSIS — Z3A21 21 weeks gestation of pregnancy: Secondary | ICD-10-CM | POA: Diagnosis not present

## 2017-10-28 DIAGNOSIS — O24419 Gestational diabetes mellitus in pregnancy, unspecified control: Secondary | ICD-10-CM | POA: Diagnosis not present

## 2017-10-28 DIAGNOSIS — O358XX0 Maternal care for other (suspected) fetal abnormality and damage, not applicable or unspecified: Secondary | ICD-10-CM | POA: Diagnosis not present

## 2017-10-28 DIAGNOSIS — Z8279 Family history of other congenital malformations, deformations and chromosomal abnormalities: Secondary | ICD-10-CM | POA: Diagnosis not present

## 2017-11-10 DIAGNOSIS — M9902 Segmental and somatic dysfunction of thoracic region: Secondary | ICD-10-CM | POA: Diagnosis not present

## 2017-11-10 DIAGNOSIS — M545 Low back pain: Secondary | ICD-10-CM | POA: Diagnosis not present

## 2017-11-10 DIAGNOSIS — M9901 Segmental and somatic dysfunction of cervical region: Secondary | ICD-10-CM | POA: Diagnosis not present

## 2017-11-10 DIAGNOSIS — M546 Pain in thoracic spine: Secondary | ICD-10-CM | POA: Diagnosis not present

## 2017-11-10 DIAGNOSIS — M9903 Segmental and somatic dysfunction of lumbar region: Secondary | ICD-10-CM | POA: Diagnosis not present

## 2017-11-10 DIAGNOSIS — M542 Cervicalgia: Secondary | ICD-10-CM | POA: Diagnosis not present

## 2017-11-12 DIAGNOSIS — M546 Pain in thoracic spine: Secondary | ICD-10-CM | POA: Diagnosis not present

## 2017-11-12 DIAGNOSIS — M542 Cervicalgia: Secondary | ICD-10-CM | POA: Diagnosis not present

## 2017-11-12 DIAGNOSIS — M545 Low back pain: Secondary | ICD-10-CM | POA: Diagnosis not present

## 2017-11-12 DIAGNOSIS — M9902 Segmental and somatic dysfunction of thoracic region: Secondary | ICD-10-CM | POA: Diagnosis not present

## 2017-11-12 DIAGNOSIS — M9901 Segmental and somatic dysfunction of cervical region: Secondary | ICD-10-CM | POA: Diagnosis not present

## 2017-11-12 DIAGNOSIS — M9903 Segmental and somatic dysfunction of lumbar region: Secondary | ICD-10-CM | POA: Diagnosis not present

## 2017-11-19 DIAGNOSIS — M9902 Segmental and somatic dysfunction of thoracic region: Secondary | ICD-10-CM | POA: Diagnosis not present

## 2017-11-19 DIAGNOSIS — M9903 Segmental and somatic dysfunction of lumbar region: Secondary | ICD-10-CM | POA: Diagnosis not present

## 2017-11-19 DIAGNOSIS — M542 Cervicalgia: Secondary | ICD-10-CM | POA: Diagnosis not present

## 2017-11-19 DIAGNOSIS — M545 Low back pain: Secondary | ICD-10-CM | POA: Diagnosis not present

## 2017-11-19 DIAGNOSIS — M9901 Segmental and somatic dysfunction of cervical region: Secondary | ICD-10-CM | POA: Diagnosis not present

## 2017-11-19 DIAGNOSIS — M546 Pain in thoracic spine: Secondary | ICD-10-CM | POA: Diagnosis not present

## 2017-11-25 DIAGNOSIS — Z362 Encounter for other antenatal screening follow-up: Secondary | ICD-10-CM | POA: Diagnosis not present

## 2017-11-30 ENCOUNTER — Ambulatory Visit (INDEPENDENT_AMBULATORY_CARE_PROVIDER_SITE_OTHER): Payer: 59 | Admitting: Neurology

## 2017-11-30 ENCOUNTER — Encounter: Payer: Self-pay | Admitting: Neurology

## 2017-11-30 VITALS — BP 125/69 | HR 97 | Ht 63.0 in | Wt 209.0 lb

## 2017-11-30 DIAGNOSIS — R569 Unspecified convulsions: Secondary | ICD-10-CM | POA: Diagnosis not present

## 2017-11-30 NOTE — Progress Notes (Addendum)
PATIENT: [name] DOB: 1996/08/21  Chief Complaint  Patient presents with  . Seizures    She is currently pregnant with her second child with a due date of 03/10/18.  Reports having her first seizure at age 22 after having a HPV injection.  Her last seizue occurred at age 48.  She had no issues with her first pregnancy.  Her OB-GYN recommended a neurological evaluation.  She has previously taken lamotrigine and the medication was discontinued by Dr. Gaynell Face at age 71.  [facility]    Murrell Redden Earlyne Iba, MD     HISTORICAL  [name] is a 22 year old female, seen in refer by obstetrician Dr. Charyl Bigger for evaluation of seizure, initial evaluation was on November 30, 2017.  She is pregnant with her second child, with a due date of Mar 10, 2018, first child is 39 years old, she had an uneventful pregnancy but with her current pregnancy, she suffered a gestational diabetes, also hyperemesis, with 16 pound weight loss.  She reported a history of seizure, first seizure was at age 29, the day after her HPV vaccination, mother was working with a needle needle try to get a splint out of her finger, she lost consciousness, had a seizure-like event, woke up on the floor confused, body soreness, she was seen by pediatric neurologist Dr. Gaynell Face at that time, was treated with lamotrigine for a few years,  Even when she was taking lamotrigine, reported seizure-like event with such as getting steroid shot in her buttock region, blood drawn, all the events happened with needle stimulation.   She was tapered off lamotrigine after 3 years of treatment, most recent seizure-like event was at age 89 when she received a TB skin test, she saw the bubble forming underneath her skin with needle injection, she felt lightheaded, vision got blurry, then passed out on the floor.  CT head without contrast in August 2010 showed no acute abnormality  I reviewed the record from [facility] by Dr. Gaynell Face,  Initial visit May 02, 2007, Reported a history of generalized tonic-clonic seizure, episode of November 14, 2007, she was found on the toilet incoherent,eyes deviated to right, lasted 30 minutes, diagnosis was complex partial seizure with secondary generalization, March 22, 2008, last seizure was in November 2008, clusters of headaches, she was given prescription of amitriptyline, Keppra 1000 twice a day, Lamictal 100 mg twice a day, patient became moody Keppra, visit on March 26, 2010, history of generalized tonic-clonic seizure, migraine without aura, insomnia, recommendation was to continue lamotrigine,  REVIEW OF SYSTEMS: Full 14 system review of systems performed and notable only for change in appetite, weight loss, swelling in legs, anemia, cramps  ALLERGIES: Allergies  Allergen Reactions  . Aspirin   . Mold Extract [Trichophyton Mentagrophyte]   . Pollen Extract   . Shellfish Allergy     HOME MEDICATIONS: Current Outpatient Medications  Medication Sig Dispense Refill  . acetaminophen (TYLENOL) 325 MG tablet Take 650 mg by mouth every 6 (six) hours as needed for moderate pain.    Marland Kitchen ondansetron (ZOFRAN) 4 MG tablet TK 1 T PO Q 6 H PRN  0  . Prenatal Vit-Fe Fumarate-FA (PRENATAL MULTIVITAMIN) TABS tablet Take 1 tablet by mouth daily at 12 noon.    . promethazine (PHENERGAN) 12.5 MG tablet Take 12.5 mg by mouth every 6 (six) hours as needed for nausea or vomiting.     No current facility-administered medications for this visit.  PAST MEDICAL HISTORY: Past Medical History:  Diagnosis Date  . Endometriosis   . Gestational diabetes   . Otitis media   . Seizures ([place])     PAST SURGICAL HISTORY: Past Surgical History:  Procedure Laterality Date  . CESAREAN SECTION    . CHOLECYSTECTOMY    . LAPAROSCOPY  2012   diagnose endometriosis  . TONSILLECTOMY      FAMILY HISTORY: Family History  Problem Relation Age of Onset  . Healthy Mother   .  Healthy Father     SOCIAL HISTORY:  Social History   Socioeconomic History  . Marital status: Married    Spouse name: Not on file  . Number of children: 2  . Years of education: some college  . Highest education level: Not on file  Social Needs  . Financial resource strain: Not on file  . Food insecurity - worry: Not on file  . Food insecurity - inability: Not on file  . Transportation needs - medical: Not on file  . Transportation needs - non-medical: Not on file  Occupational History  . Occupation: CNA  Tobacco Use  . Smoking status: Never Smoker  . Smokeless tobacco: Never Used  Substance and Sexual Activity  . Alcohol use: No  . Drug use: No  . Sexual activity: No  Other Topics Concern  . Not on file  Social History Narrative   Lives at home with husband and son. She is currently expecting her second child.   Right-handed.   No caffeine use.     PHYSICAL EXAM   Vitals:   11/30/17 1337  BP: 125/69  Pulse: 97  Weight: 209 lb (94.8 kg)  Height: 5\' 3"  (1.6 m)    Not recorded      Body mass index is 37.02 kg/m.  PHYSICAL EXAMNIATION:  Gen: NAD, conversant, well nourised, obese, well groomed                     Cardiovascular: Regular rate rhythm, no peripheral edema, warm, nontender. Eyes: Conjunctivae clear without exudates or hemorrhage Neck: Supple, no carotid bruits. Pulmonary: Clear to auscultation bilaterally   NEUROLOGICAL EXAM:  MENTAL STATUS: Speech:    Speech is normal; fluent and spontaneous with normal comprehension.  Cognition:     Orientation to time, place and person     Normal recent and remote memory     Normal Attention span and concentration     Normal Language, naming, repeating,spontaneous speech     Fund of knowledge   CRANIAL NERVES: CN II: Visual fields are full to confrontation. Fundoscopic exam is normal with sharp discs and no vascular changes. Pupils are round equal and briskly reactive to light. CN III, IV, VI:  extraocular movement are normal. No ptosis. CN V: Facial sensation is intact to pinprick in all 3 divisions bilaterally. Corneal responses are intact.  CN VII: Face is symmetric with normal eye closure and smile. CN VIII: Hearing is normal to rubbing fingers CN IX, X: Palate elevates symmetrically. Phonation is normal. CN XI: Head turning and shoulder shrug are intact CN XII: Tongue is midline with normal movements and no atrophy.  MOTOR: There is no pronator drift of out-stretched arms. Muscle bulk and tone are normal. Muscle strength is normal.  REFLEXES: Reflexes are 2+ and symmetric at the biceps, triceps, knees, and ankles. Plantar responses are flexor.  SENSORY: Intact to light touch, pinprick, positional sensation and vibratory sensation are intact in fingers and toes.  COORDINATION:  Rapid alternating movements and fine finger movements are intact. There is no dysmetria on finger-to-nose and heel-knee-shin.    GAIT/STANCE: Posture is normal. Gait is steady with normal steps, base, arm swing, and turning. Heel and toe walking are normal. Tandem gait is normal.  Romberg is absent.   DIAGNOSTIC DATA (LABS, IMAGING, TESTING) - I reviewed patient records, labs, notes, testing and imaging myself where available.   ASSESSMENT AND PLAN  Kewanna Kasprzak Chamblin is a 22 y.o. female    Seizure like event,  Last episode was in 2012.  Currently pregnant, due date is on Mar 10, 2018  EEG  I was able to review previous records by Dr. Gaynell Face, she was given the diagnosis of complex partial seizure with secondary generalization, was treated with Keppra 1000 mg twice a day, Lamictal 100 mg twice a day for a while, complaining of moodiness with Keppra, currently she is not on any antiepileptic medications.  Per patient, her medication was tapered off by Dr. Gaynell Face, but I do not find supporting documentation.  Will hold off antiepileptic medication at this point, pending EEG result.   Marcial Pacas, M.D. Ph.D.  [facility] [address] [place], [place] [address] Ph: [phone] Fax: [phone]  CC: Charyl Bigger, MD

## 2017-12-01 ENCOUNTER — Other Ambulatory Visit: Payer: Self-pay

## 2017-12-01 ENCOUNTER — Ambulatory Visit
Admission: EM | Admit: 2017-12-01 | Discharge: 2017-12-01 | Disposition: A | Discharge status: Home / Self Care | Payer: 59 | Attending: Emergency Medicine | Admitting: Emergency Medicine

## 2017-12-01 DIAGNOSIS — L02419 Cutaneous abscess of limb, unspecified: Secondary | ICD-10-CM | POA: Diagnosis not present

## 2017-12-01 DIAGNOSIS — L03119 Cellulitis of unspecified part of limb: Secondary | ICD-10-CM | POA: Diagnosis not present

## 2017-12-01 DIAGNOSIS — L02416 Cutaneous abscess of left lower limb: Secondary | ICD-10-CM | POA: Diagnosis not present

## 2017-12-01 DIAGNOSIS — L03116 Cellulitis of left lower limb: Secondary | ICD-10-CM | POA: Diagnosis not present

## 2017-12-01 MED ORDER — MUPIROCIN 2 % EX OINT: 1.0000 "application " | TOPICAL_OINTMENT | Freq: Three times a day (TID) | CUTANEOUS | 0 refills | Status: DC

## 2017-12-01 MED ORDER — CEPHALEXIN 500 MG PO CAPS: 500.0000 mg | ORAL_CAPSULE | Freq: Four times a day (QID) | ORAL | 0 refills | Status: AC

## 2017-12-01 MED ORDER — CEFTRIAXONE SODIUM 1 G IJ SOLR
1.0000 g | Freq: Once | INTRAMUSCULAR | Status: AC
  Administered 2017-12-01: 1 g via INTRAMUSCULAR

## 2017-12-01 NOTE — ED Triage Notes (Signed)
Pt c/o a boil on the right side of her inner thigh. She has kept ointment on it, with no relief. She is currently pregnant.

## 2017-12-01 NOTE — Discharge Instructions (Signed)
Return here or follow up with your doctor in 2 days for a wound check. Give [place] a working phone number so that we contact you if we need to change your antibiotics. Take the medication as written. Take 1 gram of tylenol with the motrin up to 4 times. Return to the ER if you get worse, have a persistent fever >100.4, or for any concerns.   Go to www.goodrx.com to look up your medications. This will give you a list of where you can find your prescriptions at the most affordable prices. Or ask the pharmacist what the cash price is, or if they have any other discount programs available to help make your medication more affordable. This can be less expensive than what you would pay with insurance.

## 2017-12-01 NOTE — ED Provider Notes (Signed)
HPI  SUBJECTIVE:  [name] is a 22 y.o. female who presents with a painful erythematous mass of gradually increasing size on her left inner thigh for the past 3 days.  States that it appears bruised today.  States that the pain is constant, sore.  She reports body aches, weakness and states that she "felt hot" but had no documented fevers.  No trauma, known insect bites, drainage. She is currently [redacted] weeks pregnant and is receiving regular prenatal care.  She states the pregnancy is going well.  She denies contractions, vaginal bleeding, leakage of fluid.  No antibiotics in the past month, antipyretic in the past 6-8 hours.  She has tried Neosporin and gauze without improvement of symptoms.  Symptoms worse with wearing close and palpation.  Past medical history gestational diabetes, seizures. No h/o MRSA skin infections, she has had boils before that required I&D. No h/o DM, HIV, artifical joints or heart valves.  PMD: Dr. Tiana Loft at [facility].  Past Medical History:  Diagnosis Date  . Endometriosis   . Gestational diabetes   . Otitis media   . Seizures ([place])     Past Surgical History:  Procedure Laterality Date  . CESAREAN SECTION    . CHOLECYSTECTOMY    . LAPAROSCOPY  2012   diagnose endometriosis  . TONSILLECTOMY      Family History  Problem Relation Age of Onset  . Healthy Mother   . Healthy Father     Social History   Tobacco Use  . Smoking status: Never Smoker  . Smokeless tobacco: Never Used  Substance Use Topics  . Alcohol use: No  . Drug use: No    No current facility-administered medications for this encounter.   Current Outpatient Medications:  .  acetaminophen (TYLENOL) 325 MG tablet, Take 650 mg by mouth every 6 (six) hours as needed for moderate pain., Disp: , Rfl:  .  cephALEXin (KEFLEX) 500 MG capsule, Take 1 capsule (500 mg total) by mouth 4 (four) times daily for 5 days. X 5 days, Disp: 20 capsule, Rfl: 0 .  mupirocin ointment  (BACTROBAN) 2 %, Apply 1 application topically 3 (three) times daily., Disp: 22 g, Rfl: 0 .  ondansetron (ZOFRAN) 4 MG tablet, TK 1 T PO Q 6 H PRN, Disp: , Rfl: 0 .  Prenatal Vit-Fe Fumarate-FA (PRENATAL MULTIVITAMIN) TABS tablet, Take 1 tablet by mouth daily at 12 noon., Disp: , Rfl:  .  promethazine (PHENERGAN) 12.5 MG tablet, Take 12.5 mg by mouth every 6 (six) hours as needed for nausea or vomiting., Disp: , Rfl:   Allergies  Allergen Reactions  . Aspirin   . Mold Extract [Trichophyton Mentagrophyte]   . Pollen Extract   . Shellfish Allergy      ROS  As noted in HPI.   Physical Exam  BP (!) 137/58 (BP Location: Left Arm)   Pulse 76   Temp 98.5 F (36.9 C) (Oral)   Resp 18   Wt 209 lb (94.8 kg)   SpO2 100%   BMI 37.02 kg/m   Constitutional: Well developed, well nourished, no acute distress Eyes:  EOMI, conjunctiva normal bilaterally HENT: Normocephalic, atraumatic,mucus membranes moist Respiratory: Normal inspiratory effort Cardiovascular: Normal rate GI: nondistended skin: 7 x 6 cm tender area of induration with central fluctuance left upper inner thigh.  Marked area of induration and erythema with a marker for reference.  See picture.    Lymph: No inguinal lymphadenopathy Musculoskeletal: no deformities Neurologic: Alert &  oriented x 3, no focal neuro deficits Psychiatric: Speech and behavior appropriate   ED Course   Medications  cefTRIAXone (ROCEPHIN) injection 1 g (1 g Intramuscular Given 12/01/17 1732)    Orders Placed This Encounter  Procedures  . Wound or Superficial Culture    Standing Status:   Standing    Number of Occurrences:   1    Order Specific Question:   Patient immune status    Answer:   Normal    No results found for this or any previous visit (from the past 24 hour(s)). No results found.  ED Clinical Impression  Cellulitis and abscess of leg   ED Assessment/Plan  Giving Rocephin 1 g here today.  Procedure note.  Cleaned  area with iodine swab x2.  Then cleaned with alcohol.  Used 0.5 cc of lidocaine 2% with epinephrine local infiltration adequate anesthesia.  Made a single linear incision using an 11 blade, expressed a moderate amount of pus.  Explored wound to break up loculations. sent cultures.  Then irrigated with 250 cc of normal saline.  Packing placed.  Pressure dressing placed.  Patient tolerated procedure well.  Return here in 2 days for wound check, packing removal, to the ER if she gets worse in the interim.  Home with Keflex and Bactroban as she has no history of MRSA.  We can change it to clindamycin if her cultures come back positive for MRSA.  Discussed  MDM, plan and followup with patient . Discussed sn/sx that should prompt return to the  ED. Patient agrees with plan.  Meds ordered this encounter  Medications  . cefTRIAXone (ROCEPHIN) injection 1 g  . mupirocin ointment (BACTROBAN) 2 %    Sig: Apply 1 application topically 3 (three) times daily.    Dispense:  22 g    Refill:  0  . cephALEXin (KEFLEX) 500 MG capsule    Sig: Take 1 capsule (500 mg total) by mouth 4 (four) times daily for 5 days. X 5 days    Dispense:  20 capsule    Refill:  0    *This clinic note was created using L[employer]. Therefore, there may be occasional mistakes despite careful proofreading.  ?    Melynda Ripple, MD 12/01/17 [id number]

## 2017-12-03 ENCOUNTER — Encounter: Payer: Self-pay | Admitting: *Deleted

## 2017-12-03 ENCOUNTER — Ambulatory Visit
Admission: EM | Admit: 2017-12-03 | Discharge: 2017-12-03 | Disposition: A | Discharge status: Home / Self Care | Payer: 59 | Attending: Emergency Medicine | Admitting: Emergency Medicine

## 2017-12-03 DIAGNOSIS — Z5189 Encounter for other specified aftercare: Secondary | ICD-10-CM

## 2017-12-03 NOTE — ED Triage Notes (Signed)
Pt had an abscess drained from left leg on Wednesday. Pt was told to come back today to have dressing remove.

## 2017-12-03 NOTE — Discharge Instructions (Signed)
Follow-up with your primary care physician or return here if you start getting worse.  Otherwise keep it clean with soap and water, keep it covered until it heals.

## 2017-12-03 NOTE — ED Provider Notes (Signed)
HPI  SUBJECTIVE:  [name] is a 22 y.o. female who presents for wound check and packing removal.  She was here 2 days ago and had an I&D of her left upper medial thigh done.  Patient states that she feels much better.  Pain has significantly improved, she denies any complaints.  She denies fevers, body aches.  States that she has been keeping it covered with a bandage.    Past Medical History:  Diagnosis Date  . Endometriosis   . Gestational diabetes   . Otitis media   . Seizures ([place])     Past Surgical History:  Procedure Laterality Date  . CESAREAN SECTION    . CHOLECYSTECTOMY    . LAPAROSCOPY  2012   diagnose endometriosis  . TONSILLECTOMY      Family History  Problem Relation Age of Onset  . Healthy Mother   . Healthy Father     Social History   Tobacco Use  . Smoking status: Never Smoker  . Smokeless tobacco: Never Used  Substance Use Topics  . Alcohol use: No  . Drug use: No    No current facility-administered medications for this encounter.   Current Outpatient Medications:  .  acetaminophen (TYLENOL) 325 MG tablet, Take 650 mg by mouth every 6 (six) hours as needed for moderate pain., Disp: , Rfl:  .  cephALEXin (KEFLEX) 500 MG capsule, Take 1 capsule (500 mg total) by mouth 4 (four) times daily for 5 days. X 5 days, Disp: 20 capsule, Rfl: 0 .  mupirocin ointment (BACTROBAN) 2 %, Apply 1 application topically 3 (three) times daily., Disp: 22 g, Rfl: 0 .  ondansetron (ZOFRAN) 4 MG tablet, TK 1 T PO Q 6 H PRN, Disp: , Rfl: 0 .  Prenatal Vit-Fe Fumarate-FA (PRENATAL MULTIVITAMIN) TABS tablet, Take 1 tablet by mouth daily at 12 noon., Disp: , Rfl:  .  promethazine (PHENERGAN) 12.5 MG tablet, Take 12.5 mg by mouth every 6 (six) hours as needed for nausea or vomiting., Disp: , Rfl:   Allergies  Allergen Reactions  . Aspirin   . Mold Extract [Trichophyton Mentagrophyte]   . Pollen Extract   . Shellfish Allergy      ROS  As noted in HPI.    Physical Exam  BP 103/75 (BP Location: Left Arm)   Pulse 76   Temp 97.6 F (36.4 C) (Oral)   Resp 16   Ht 5\' 3"  (1.6 m)   Wt 209 lb (94.8 kg)   SpO2 100%   BMI 37.02 kg/m   Constitutional: Well developed, well nourished, no acute distress Eyes:  EOMI, conjunctiva normal bilaterally HENT: Normocephalic, atraumatic,mucus membranes moist Respiratory: Normal inspiratory effort Cardiovascular: Normal rate GI: nondistended skin: 2 x 2 centimeter area of mildly tender induration and erythema surrounding central incision medial left upper thigh.  Packing intact. Musculoskeletal: no deformities Neurologic: Alert & oriented x 3, no focal neuro deficits Psychiatric: Speech and behavior appropriate   ED Course   Medications - No data to display  No orders of the defined types were placed in this encounter.   No results found for this or any previous visit (from the past 24 hour(s)). No results found.  ED Clinical Impression  Wound check, abscess   ED Assessment/Plan  Packing removed.   no expressible purulent drainage.  Patient tolerated procedure well.  Dressing replaced.  Culture and sensitivity still pending.  Patient overall seems to be getting much better.  Advised her to finish the  antibiotics.  Keep clean with soap and water, bacitracin as needed.  Follow-up with her PMD as needed or may return here.  No orders of the defined types were placed in this encounter.   *This clinic note was created using Dragon dictation software. Therefore, there may be occasional mistakes despite careful proofreading.   ?   Melynda Ripple, MD 12/03/17 [phone]

## 2017-12-04 LAB — AEROBIC CULTURE  (SUPERFICIAL SPECIMEN): CULTURE: NORMAL

## 2017-12-04 LAB — AEROBIC CULTURE W GRAM STAIN (SUPERFICIAL SPECIMEN): Special Requests: NORMAL

## 2017-12-15 ENCOUNTER — Telehealth: Payer: Self-pay | Admitting: *Deleted

## 2017-12-15 ENCOUNTER — Other Ambulatory Visit: Payer: 59

## 2017-12-16 ENCOUNTER — Encounter (HOSPITAL_COMMUNITY): Payer: Self-pay

## 2017-12-16 DIAGNOSIS — Z3A28 28 weeks gestation of pregnancy: Secondary | ICD-10-CM | POA: Diagnosis not present

## 2017-12-16 DIAGNOSIS — O358XX0 Maternal care for other (suspected) fetal abnormality and damage, not applicable or unspecified: Secondary | ICD-10-CM | POA: Diagnosis not present

## 2017-12-16 DIAGNOSIS — Z23 Encounter for immunization: Secondary | ICD-10-CM | POA: Diagnosis not present

## 2017-12-16 DIAGNOSIS — Z3689 Encounter for other specified antenatal screening: Secondary | ICD-10-CM | POA: Diagnosis not present

## 2017-12-17 ENCOUNTER — Other Ambulatory Visit (HOSPITAL_COMMUNITY): Payer: Self-pay | Admitting: Obstetrics and Gynecology

## 2017-12-17 DIAGNOSIS — Z3689 Encounter for other specified antenatal screening: Secondary | ICD-10-CM

## 2017-12-17 DIAGNOSIS — Q048 Other specified congenital malformations of brain: Secondary | ICD-10-CM

## 2017-12-17 DIAGNOSIS — Z3A29 29 weeks gestation of pregnancy: Secondary | ICD-10-CM

## 2017-12-17 DIAGNOSIS — G9389 Other specified disorders of brain: Secondary | ICD-10-CM

## 2017-12-24 ENCOUNTER — Other Ambulatory Visit (HOSPITAL_COMMUNITY): Payer: Self-pay | Admitting: Obstetrics and Gynecology

## 2017-12-24 ENCOUNTER — Ambulatory Visit (HOSPITAL_COMMUNITY)
Admission: RE | Admit: 2017-12-24 | Discharge: 2017-12-24 | Disposition: A | Discharge status: Home / Self Care | Payer: 59 | Source: Ambulatory Visit | Attending: Obstetrics and Gynecology | Admitting: Obstetrics and Gynecology

## 2017-12-24 ENCOUNTER — Encounter (HOSPITAL_COMMUNITY): Payer: Self-pay

## 2017-12-24 DIAGNOSIS — Z3A29 29 weeks gestation of pregnancy: Secondary | ICD-10-CM

## 2017-12-24 DIAGNOSIS — G9389 Other specified disorders of brain: Secondary | ICD-10-CM | POA: Diagnosis not present

## 2017-12-24 DIAGNOSIS — O24410 Gestational diabetes mellitus in pregnancy, diet controlled: Secondary | ICD-10-CM

## 2017-12-24 DIAGNOSIS — G40909 Epilepsy, unspecified, not intractable, without status epilepticus: Secondary | ICD-10-CM | POA: Diagnosis not present

## 2017-12-24 DIAGNOSIS — O358XX0 Maternal care for other (suspected) fetal abnormality and damage, not applicable or unspecified: Secondary | ICD-10-CM | POA: Diagnosis not present

## 2017-12-24 DIAGNOSIS — O34219 Maternal care for unspecified type scar from previous cesarean delivery: Secondary | ICD-10-CM | POA: Diagnosis not present

## 2017-12-24 DIAGNOSIS — Q048 Other specified congenital malformations of brain: Secondary | ICD-10-CM

## 2017-12-24 DIAGNOSIS — Z363 Encounter for antenatal screening for malformations: Secondary | ICD-10-CM | POA: Diagnosis not present

## 2017-12-24 DIAGNOSIS — Z3689 Encounter for other specified antenatal screening: Secondary | ICD-10-CM

## 2017-12-24 DIAGNOSIS — O99353 Diseases of the nervous system complicating pregnancy, third trimester: Secondary | ICD-10-CM | POA: Diagnosis not present

## 2017-12-24 NOTE — Consult Note (Signed)
Maternal Fetal Medicine Consultation  I had the pleasure of seeing your patient Arlethia Basso for Maternal-Fetal Medicine consultation on 12/24/2017. As you know, Revonda is a 22 y.o. G3P1011 at [redacted]w[redacted]d who presents for consultation regarding enlarged cisterna magna.  Misti was noted to have a borderline enlarged cisterna magna at the time of follow-up ultrasound measuring 47mm. Previous ultrasounds were notable for a choroid plexus cyst with a low-risk quad screen. This pregnancy has otherwise been complicated by an early diagnosis of gestational diabetes well controlled by diet, with a hemoglobin A1c of 4.8.   Wylene's obstetric history is notable for one term cesarean delivery for failure to progress.   Samiyah feels well today. She denies contractions, loss of fluid, or vaginal bleeding. Fetal movement is active.   The remainder of Melissaann's medical history is notable for a remote history of seizure disorder. Her past surgical history is significant for tonsillectomy, cholecystectomy, and diagnostic laparoscopy for endometriosis. She takes prenatal vitamins, zofran, phenergan and tylenol and is allergic to aspirin.  Margarit denies alcohol, tobacco or other drug use. Her family history is unremarkable.  Prior to her visit today Brigette had a detailed anatomy ultrasound that showed the cisterna magna to be slightly enlarged at 57mm. The remainder of the intracranial anatomy including lateral ventricles and cerebellar vermis and globes was within normal limits. No anomalies were noted. Overall growth was normal but both the head circumference and cerebellum measured > 95%ile.  I reviewed the findings of today's ultrasound showing mega cisterna magna with Jalayne and her family in detail. When isolated, mega cisterna magna is considered a normal variant with normal neurodevelopmental outcome. Given her gestational diabetes I recommend serial ultrasounds for growth. The intracranial  anatomy may be reassessed at that time for any new findings. Sianni prefers to continue follow-up ultrasounds through your office. We are happy to see her again should any new findings develop.   Thank you for the opportunity to be a part of the care of [employer]. Please contact our office if we can be of further assistance.   I spent approximately 40 minutes with this patient with over 50% of time spent in face-to-face counseling.  Abram Sander, MD Maternal-Fetal Medicine

## 2017-12-30 ENCOUNTER — Other Ambulatory Visit: Payer: 59

## 2017-12-31 ENCOUNTER — Encounter: Payer: Self-pay | Admitting: Neurology

## 2018-01-13 DIAGNOSIS — Z3A32 32 weeks gestation of pregnancy: Secondary | ICD-10-CM | POA: Diagnosis not present

## 2018-01-13 DIAGNOSIS — O24410 Gestational diabetes mellitus in pregnancy, diet controlled: Secondary | ICD-10-CM | POA: Diagnosis not present

## 2018-01-17 DIAGNOSIS — Z3483 Encounter for supervision of other normal pregnancy, third trimester: Secondary | ICD-10-CM | POA: Diagnosis not present

## 2018-01-17 DIAGNOSIS — Z3482 Encounter for supervision of other normal pregnancy, second trimester: Secondary | ICD-10-CM | POA: Diagnosis not present

## 2018-01-20 DIAGNOSIS — N898 Other specified noninflammatory disorders of vagina: Secondary | ICD-10-CM | POA: Diagnosis not present

## 2018-01-20 DIAGNOSIS — A6000 Herpesviral infection of urogenital system, unspecified: Secondary | ICD-10-CM | POA: Diagnosis not present

## 2018-01-24 ENCOUNTER — Other Ambulatory Visit: Payer: Self-pay | Admitting: Obstetrics and Gynecology

## 2018-01-27 DIAGNOSIS — O24410 Gestational diabetes mellitus in pregnancy, diet controlled: Secondary | ICD-10-CM | POA: Diagnosis not present

## 2018-01-27 DIAGNOSIS — Z3A34 34 weeks gestation of pregnancy: Secondary | ICD-10-CM | POA: Diagnosis not present

## 2018-02-07 DIAGNOSIS — O24410 Gestational diabetes mellitus in pregnancy, diet controlled: Secondary | ICD-10-CM | POA: Diagnosis not present

## 2018-02-07 DIAGNOSIS — Z3A35 35 weeks gestation of pregnancy: Secondary | ICD-10-CM | POA: Diagnosis not present

## 2018-02-07 DIAGNOSIS — Z3685 Encounter for antenatal screening for Streptococcus B: Secondary | ICD-10-CM | POA: Diagnosis not present

## 2018-02-07 LAB — OB RESULTS CONSOLE GBS: GBS: POSITIVE

## 2018-02-08 DIAGNOSIS — Z3A35 35 weeks gestation of pregnancy: Secondary | ICD-10-CM | POA: Diagnosis not present

## 2018-02-10 DIAGNOSIS — B373 Candidiasis of vulva and vagina: Secondary | ICD-10-CM | POA: Diagnosis not present

## 2018-02-10 DIAGNOSIS — Z3A36 36 weeks gestation of pregnancy: Secondary | ICD-10-CM | POA: Diagnosis not present

## 2018-02-14 DIAGNOSIS — Z3A36 36 weeks gestation of pregnancy: Secondary | ICD-10-CM | POA: Diagnosis not present

## 2018-02-14 DIAGNOSIS — O24410 Gestational diabetes mellitus in pregnancy, diet controlled: Secondary | ICD-10-CM | POA: Diagnosis not present

## 2018-02-17 ENCOUNTER — Encounter (HOSPITAL_COMMUNITY): Payer: Self-pay

## 2018-02-17 ENCOUNTER — Other Ambulatory Visit (HOSPITAL_COMMUNITY): Payer: Self-pay | Admitting: Obstetrics and Gynecology

## 2018-02-17 ENCOUNTER — Encounter (HOSPITAL_COMMUNITY): Payer: Self-pay | Admitting: *Deleted

## 2018-02-17 ENCOUNTER — Inpatient Hospital Stay (HOSPITAL_COMMUNITY)
Admission: AD | Admit: 2018-02-17 | Discharge: 2018-02-17 | Disposition: A | Discharge status: Home / Self Care | Payer: 59 | Source: Ambulatory Visit | Attending: Obstetrics & Gynecology | Admitting: Obstetrics & Gynecology

## 2018-02-17 ENCOUNTER — Other Ambulatory Visit (HOSPITAL_COMMUNITY): Payer: Self-pay | Admitting: Obstetrics & Gynecology

## 2018-02-17 DIAGNOSIS — O479 False labor, unspecified: Secondary | ICD-10-CM

## 2018-02-17 DIAGNOSIS — O4703 False labor before 37 completed weeks of gestation, third trimester: Secondary | ICD-10-CM | POA: Diagnosis not present

## 2018-02-17 DIAGNOSIS — O24419 Gestational diabetes mellitus in pregnancy, unspecified control: Secondary | ICD-10-CM | POA: Diagnosis not present

## 2018-02-17 DIAGNOSIS — Z3A37 37 weeks gestation of pregnancy: Secondary | ICD-10-CM | POA: Diagnosis not present

## 2018-02-17 DIAGNOSIS — R7303 Prediabetes: Secondary | ICD-10-CM

## 2018-02-17 DIAGNOSIS — O4700 False labor before 37 completed weeks of gestation, unspecified trimester: Secondary | ICD-10-CM

## 2018-02-17 DIAGNOSIS — Z3A36 36 weeks gestation of pregnancy: Secondary | ICD-10-CM | POA: Diagnosis not present

## 2018-02-17 NOTE — Discharge Instructions (Signed)
Cesarean Delivery Cesarean birth, or cesarean delivery, is the surgical delivery of a baby through an incision in the abdomen and the uterus. This may be referred to as a C-section. This procedure may be scheduled ahead of time, or it may be done in an emergency situation. Tell a health care provider about:  Any allergies you have.  All medicines you are taking, including vitamins, herbs, eye drops, creams, and over-the-counter medicines.  Any problems you or family members have had with anesthetic medicines.  Any blood disorders you have.  Any surgeries you have had.  Any medical conditions you have.  Whether you or any members of your family have a history of deep vein thrombosis (DVT) or pulmonary embolism (PE). What are the risks? Generally, this is a safe procedure. However, problems may occur, including:  Infection.  Bleeding.  Allergic reactions to medicines.  Damage to other structures or organs.  Blood clots.  Injury to your baby.  What happens before the procedure?  Follow instructions from your health care provider about eating or drinking restrictions.  Follow instructions from your health care provider about bathing before your procedure to help reduce your risk of infection.  If you know that you are going to have a cesarean delivery, do not shave your pubic area. Shaving before the procedure may increase your risk of infection.  Ask your health care provider about: ? Changing or stopping your regular medicines. This is especially important if you are taking diabetes medicines or blood thinners. ? Your pain management plan. This is especially important if you plan to breastfeed your baby. ? How long you will be in the hospital after the procedure. ? Any concerns you may have about receiving blood products if you need them during the procedure. ? Cord blood banking, if you plan to collect your babys umbilical cord blood.  You may also want to ask your  health care provider: ? Whether you will be able to hold or breastfeed your baby while you are still in the operating room. ? Whether your baby can stay with you immediately after the procedure and during your recovery. ? Whether a family member or a person of your choice can go with you into the operating room and stay with you during the procedure, immediately after the procedure, and during your recovery.  Plan to have someone drive you home when you are discharged from the hospital. What happens during the procedure?  Fetal monitors will be placed on your abdomen to monitor your heart rate and your baby's heart rate.  Depending on the reason for your cesarean delivery, you may have a physical exam or additional testing, such as an ultrasound.  An IV tube will be inserted into one of your veins.  You may have your blood or urine tested.  You will be given antibiotic medicine to help prevent infection.  You may be given a special warming gown to wear to keep your temperature stable.  Hair may be removed from your pubic area.  The skin of your pubic area and lower abdomen will be cleaned with a germ-killing solution (antiseptic).  A catheter may be inserted into your bladder through your urethra. This drains your urine during the procedure.  You may be given one or more of the following: ? A medicine to numb the area (local anesthetic). ? A medicine to make you fall asleep (general anesthetic). ? A medicine (regional anesthetic) that is injected into your back or through a small  thin tube placed in your back (spinal anesthetic or epidural anesthetic). This numbs everything below the injection site and allows you to stay awake during your procedure. If this makes you feel nauseous, tell your health care provider. Medicines will be available to help reduce any nausea you may feel. °· An incision will be made in your abdomen, and then in your uterus. °· If you are awake during your  procedure, you may feel tugging and pulling in your abdomen, but you should not feel pain. If you feel pain, tell your health care provider immediately. °· Your baby will be removed from your uterus. You may feel more pressure or pushing while this happens. °· Immediately after birth, your baby will be dried and kept warm. You may be able to hold and breastfeed your baby. The umbilical cord may be clamped and cut during this time. °· Your placenta will be removed from your uterus. °· Your incisions will be closed with stitches (sutures). Staples, skin glue, or adhesive strips may also be applied to the incision in your abdomen. °· Bandages (dressings) will be placed over the incision in your abdomen. °The procedure may vary among health care providers and hospitals. °What happens after the procedure? °· Your blood pressure, heart rate, breathing rate, and blood oxygen level will be monitored often until the medicines you were given have worn off. °· You may continue to receive fluids and medicines through an IV tube. °· You will have some pain. Medicines will be available to help control your pain. °· To help prevent blood clots: °? You may be given medicines. °? You may have to wear compression stockings or devices. °? You will be encouraged to walk around when you are able. °· Hospital staff will encourage and support bonding with your baby. Your hospital may allow you and your baby to stay in the same room (rooming in) during your hospital stay to encourage successful breastfeeding. °· You may be encouraged to cough and breathe deeply often. This helps to prevent lung problems. °· If you have a catheter draining your urine, it will be removed as soon as possible after your procedure. °This information is not intended to replace advice given to you by your health care provider. Make sure you discuss any questions you have with your health care provider. °Document Released: 09/28/2005 Document Revised: 03/05/2016  Document Reviewed: 07/09/2015 °Elsevier Interactive Patient Education © 2018 [employer] °Braxton Hicks Contractions °Contractions of the uterus can occur throughout pregnancy, but they are not always a sign that you are in labor. You may have practice contractions called Braxton Hicks contractions. These false labor contractions are sometimes confused with true labor. °What are Braxton Hicks contractions? °Braxton Hicks contractions are tightening movements that occur in the muscles of the uterus before labor. Unlike true labor contractions, these contractions do not result in opening (dilation) and thinning of the cervix. Toward the end of pregnancy (32-34 weeks), Braxton Hicks contractions can happen more often and may become stronger. These contractions are sometimes difficult to tell apart from true labor because they can be very uncomfortable. You should not feel embarrassed if you go to the hospital with false labor. °Sometimes, the only way to tell if you are in true labor is for your health care provider to look for changes in the cervix. The health care provider will do a physical exam and may monitor your contractions. If you are not in true labor, the exam should show that your cervix is   not dilating and your water has not broken. °If there are other health problems associated with your pregnancy, it is completely safe for you to be sent home with false labor. You may continue to have Braxton Hicks contractions until you go into true labor. °How to tell the difference between true labor and false labor °True labor °· Contractions last 30-70 seconds. °· Contractions become very regular. °· Discomfort is usually felt in the top of the uterus, and it spreads to the lower abdomen and low back. °· Contractions do not go away with walking. °· Contractions usually become more intense and increase in frequency. °· The cervix dilates and gets thinner. °False labor °· Contractions are usually shorter and not as  strong as true labor contractions. °· Contractions are usually irregular. °· Contractions are often felt in the front of the lower abdomen and in the groin. °· Contractions may go away when you walk around or change positions while lying down. °· Contractions get weaker and are shorter-lasting as time goes on. °· The cervix usually does not dilate or become thin. °Follow these instructions at home: °· Take over-the-counter and prescription medicines only as told by your health care provider. °· Keep up with your usual exercises and follow other instructions from your health care provider. °· Eat and drink lightly if you think you are going into labor. °· If Braxton Hicks contractions are making you uncomfortable: °? Change your position from lying down or resting to walking, or change from walking to resting. °? Sit and rest in a tub of warm water. °? Drink enough fluid to keep your urine pale yellow. Dehydration may cause these contractions. °? Do slow and deep breathing several times an hour. °· Keep all follow-up prenatal visits as told by your health care provider. This is important. °Contact a health care provider if: °· You have a fever. °· You have continuous pain in your abdomen. °Get help right away if: °· Your contractions become stronger, more regular, and closer together. °· You have fluid leaking or gushing from your vagina. °· You pass blood-tinged mucus (bloody show). °· You have bleeding from your vagina. °· You have low back pain that you never had before. °· You feel your baby’s head pushing down and causing pelvic pressure. °· Your baby is not moving inside you as much as it used to. °Summary °· Contractions that occur before labor are called Braxton Hicks contractions, false labor, or practice contractions. °· Braxton Hicks contractions are usually shorter, weaker, farther apart, and less regular than true labor contractions. True labor contractions usually become progressively stronger and  regular and they become more frequent. °· Manage discomfort from Braxton Hicks contractions by changing position, resting in a warm bath, drinking plenty of water, or practicing deep breathing. °This information is not intended to replace advice given to you by your health care provider. Make sure you discuss any questions you have with your health care provider. °Document Released: 02/11/2017 Document Revised: 02/11/2017 Document Reviewed: 02/11/2017 °Elsevier Interactive Patient Education © 2018 [employer] °Fetal Movement Counts °Patient Name: ________________________________________________ Patient Due Date: ____________________ °What is a fetal movement count? °A fetal movement count is the number of times that you feel your baby move during a certain amount of time. This may also be called a fetal kick count. A fetal movement count is recommended for every pregnant woman. You may be asked to start counting fetal movements as early as week 28 of your pregnancy. °Pay attention   to when your baby is most active. You may notice your baby's sleep and wake cycles. You may also notice things that make your baby move more. You should do a fetal movement count: °· When your baby is normally most active. °· At the same time each day. ° °A good time to count movements is while you are resting, after having something to eat and drink. °How do I count fetal movements? °1. Find a quiet, comfortable area. Sit, or lie down on your side. °2. Write down the date, the start time and stop time, and the number of movements that you felt between those two times. Take this information with you to your health care visits. °3. For 2 hours, count kicks, flutters, swishes, rolls, and jabs. You should feel at least 10 movements during 2 hours. °4. You may stop counting after you have felt 10 movements. °5. If you do not feel 10 movements in 2 hours, have something to eat and drink. Then, keep resting and counting for 1 hour. If you feel  at least 4 movements during that hour, you may stop counting. °Contact a health care provider if: °· You feel fewer than 4 movements in 2 hours. °· Your baby is not moving like he or she usually does. °Date: ____________ Start time: ____________ Stop time: ____________ Movements: ____________ °Date: ____________ Start time: ____________ Stop time: ____________ Movements: ____________ °Date: ____________ Start time: ____________ Stop time: ____________ Movements: ____________ °Date: ____________ Start time: ____________ Stop time: ____________ Movements: ____________ °Date: ____________ Start time: ____________ Stop time: ____________ Movements: ____________ °Date: ____________ Start time: ____________ Stop time: ____________ Movements: ____________ °Date: ____________ Start time: ____________ Stop time: ____________ Movements: ____________ °Date: ____________ Start time: ____________ Stop time: ____________ Movements: ____________ °Date: ____________ Start time: ____________ Stop time: ____________ Movements: ____________ °This information is not intended to replace advice given to you by your health care provider. Make sure you discuss any questions you have with your health care provider. °Document Released: 10/28/2006 Document Revised: 05/27/2016 Document Reviewed: 11/07/2015 °Elsevier Interactive Patient Education © 2018 [employer] ° °

## 2018-02-17 NOTE — MAU Provider Note (Signed)
  History     CSN: [id number]  Arrival date and time: 02/17/18 0037   None     Chief Complaint  Patient presents with  . Labor Eval   HPI [id number], 36.6 wks contractions strong and regular few hours, called after-hr service with message she was en route to the hospital. No VB/ leaking. Good FMs.  S/p BMTZ last wk for preterm cervical dilatation   Past Medical History:  Diagnosis Date  . Endometriosis   . Gestational diabetes   . Otitis media   . Seizures ([place])     Past Surgical History:  Procedure Laterality Date  . CESAREAN SECTION    . CHOLECYSTECTOMY    . LAPAROSCOPY  2012   diagnose endometriosis  . TONSILLECTOMY      Family History  Problem Relation Age of Onset  . Healthy Mother   . Healthy Father     Social History   Tobacco Use  . Smoking status: Never Smoker  . Smokeless tobacco: Never Used  Substance Use Topics  . Alcohol use: No  . Drug use: No    Allergies:  Allergies  Allergen Reactions  . Aspirin   . Mold Extract [Trichophyton Mentagrophyte]   . Pollen Extract   . Shellfish Allergy     Medications Prior to Admission  Medication Sig Dispense Refill Last Dose  . acetaminophen (TYLENOL) 325 MG tablet Take 650 mg by mouth every 6 (six) hours as needed for moderate pain.   02/16/2018 at Unknown time  . ondansetron (ZOFRAN) 4 MG tablet TK 1 T PO Q 6 H PRN  0 Past Week at Unknown time  . Prenatal Vit-Fe Fumarate-FA (PRENATAL MULTIVITAMIN) TABS tablet Take 1 tablet by mouth daily at 12 noon.   02/16/2018 at Unknown time  . promethazine (PHENERGAN) 12.5 MG tablet Take 12.5 mg by mouth every 6 (six) hours as needed for nausea or vomiting.   Past Week at Unknown time  . valACYclovir (VALTREX) 500 MG tablet Take 500 mg by mouth 2 (two) times daily.   02/16/2018 at Unknown time  . mupirocin ointment (BACTROBAN) 2 % Apply 1 application topically 3 (three) times daily. (Patient not taking: Reported on 12/24/2017) 22 g 0 Not Taking    Review of  Systems Physical Exam   Blood pressure 112/66, pulse 83, temperature 97.8 F (36.6 C), temperature source Oral, resp. rate 20, height 5\' 3"  (1.6 m), weight 198 lb (89.8 kg), last menstrual period 05/23/2017, SpO2 98 %.  Physical Exam  Physical exam:  A&O x 3, no acute distress. Pleasant HEENT neg, no thyromegaly Abdo soft, non tender, non acute, n Extr no edema/ tenderness Pelvic1-2/ soft/ thick/ -2 FHT  130, reactive, + accels/ no decels/ mod variab- cvat E;** toco - none   MAU Course  Procedures Repeat exam after 2 hour- unchanged Baby in category I- reactive  Assessment and Plan  37 wks threatened preterm labor FAC F/up Dr Lillia [place] R Jayanth Szczesniak 02/17/2018, 3:14 AM

## 2018-02-17 NOTE — MAU Note (Signed)
Pt reports strong contractions about every 5-10 mins. Pt denies LOF or vaginal bleeding. Reports good fetal movement.

## 2018-02-21 ENCOUNTER — Encounter (HOSPITAL_COMMUNITY): Payer: Self-pay

## 2018-02-21 ENCOUNTER — Ambulatory Visit (HOSPITAL_COMMUNITY)
Admission: RE | Admit: 2018-02-21 | Discharge: 2018-02-21 | Disposition: A | Discharge status: Home / Self Care | Payer: 59 | Source: Ambulatory Visit | Attending: Obstetrics and Gynecology | Admitting: Obstetrics and Gynecology

## 2018-02-21 VITALS — BP 112/64 | HR 79 | Wt 199.4 lb

## 2018-02-21 DIAGNOSIS — O479 False labor, unspecified: Secondary | ICD-10-CM | POA: Diagnosis not present

## 2018-02-21 DIAGNOSIS — O9989 Other specified diseases and conditions complicating pregnancy, childbirth and the puerperium: Secondary | ICD-10-CM | POA: Diagnosis not present

## 2018-02-21 DIAGNOSIS — R7303 Prediabetes: Secondary | ICD-10-CM | POA: Diagnosis not present

## 2018-02-21 DIAGNOSIS — Z3A37 37 weeks gestation of pregnancy: Secondary | ICD-10-CM

## 2018-02-21 DIAGNOSIS — O24419 Gestational diabetes mellitus in pregnancy, unspecified control: Secondary | ICD-10-CM | POA: Diagnosis not present

## 2018-02-21 DIAGNOSIS — O24410 Gestational diabetes mellitus in pregnancy, diet controlled: Secondary | ICD-10-CM

## 2018-02-21 DIAGNOSIS — O4700 False labor before 37 completed weeks of gestation, unspecified trimester: Secondary | ICD-10-CM

## 2018-02-21 NOTE — Procedures (Signed)
[name] [birth year] [redacted]w[redacted]d  Fetus A Non-Stress Test Interpretation for 02/21/18  Indication: GDM  Fetal Heart Rate A Mode: External Baseline Rate (A): 130 bpm Variability: Moderate Accelerations: 15 x 15 Decelerations: None Multiple birth?: No  Uterine Activity Mode: Toco Contraction Frequency (min): none noted  Interpretation (Fetal Testing) Nonstress Test Interpretation: Reactive Comments: FHR tracing rev'd by Dr. Lucia Gaskins

## 2018-02-24 ENCOUNTER — Encounter (HOSPITAL_COMMUNITY): Payer: Self-pay

## 2018-02-25 DIAGNOSIS — D696 Thrombocytopenia, unspecified: Secondary | ICD-10-CM | POA: Diagnosis not present

## 2018-02-25 DIAGNOSIS — O24419 Gestational diabetes mellitus in pregnancy, unspecified control: Secondary | ICD-10-CM | POA: Diagnosis not present

## 2018-02-25 DIAGNOSIS — Z3A38 38 weeks gestation of pregnancy: Secondary | ICD-10-CM | POA: Diagnosis not present

## 2018-03-01 DIAGNOSIS — Z3A38 38 weeks gestation of pregnancy: Secondary | ICD-10-CM | POA: Diagnosis not present

## 2018-03-01 DIAGNOSIS — O24419 Gestational diabetes mellitus in pregnancy, unspecified control: Secondary | ICD-10-CM | POA: Diagnosis not present

## 2018-03-01 DIAGNOSIS — O3663X0 Maternal care for excessive fetal growth, third trimester, not applicable or unspecified: Secondary | ICD-10-CM | POA: Diagnosis not present

## 2018-03-02 NOTE — Patient Instructions (Signed)
[name]  03/02/2018   Your procedure is scheduled on:  03/04/2018  Enter through the Main Entrance of [facility] [place] up the phone at the desk and dial [phone]  Call this number if you have problems the morning of surgery:[phone]  Remember:   Do not eat food:(After Midnight) Desps de medianoche.  Do not drink clear liquids: (After Midnight) Desps de medianoche.  Take these medicines the morning of surgery with A SIP OF WATER: valtrex   Do not wear jewelry, make-up or nail polish.  Do not wear lotions, powders, or perfumes. Do not wear deodorant.  Do not shave 48 hours prior to surgery.  Do not bring valuables to the hospital.  [facility] is not   responsible for any belongings or valuables brought to the hospital.  Contacts, dentures or bridgework may not be worn into surgery.  Leave suitcase in the car. After surgery it may be brought to your room.  For patients admitted to the hospital, checkout time is 11:00 AM the day of              discharge.    N/A   Please read over the following fact sheets that you were given:   Surgical Site Infection Prevention

## 2018-03-03 ENCOUNTER — Encounter (HOSPITAL_COMMUNITY)
Admission: RE | Admit: 2018-03-03 | Discharge: 2018-03-03 | Disposition: A | Discharge status: Home / Self Care | Payer: 59 | Source: Ambulatory Visit | Attending: Obstetrics and Gynecology | Admitting: Obstetrics and Gynecology

## 2018-03-03 DIAGNOSIS — O99214 Obesity complicating childbirth: Secondary | ICD-10-CM | POA: Diagnosis not present

## 2018-03-03 DIAGNOSIS — D6959 Other secondary thrombocytopenia: Secondary | ICD-10-CM | POA: Diagnosis not present

## 2018-03-03 DIAGNOSIS — O2432 Unspecified pre-existing diabetes mellitus in childbirth: Secondary | ICD-10-CM | POA: Diagnosis not present

## 2018-03-03 DIAGNOSIS — O9912 Other diseases of the blood and blood-forming organs and certain disorders involving the immune mechanism complicating childbirth: Secondary | ICD-10-CM | POA: Diagnosis not present

## 2018-03-03 DIAGNOSIS — A6000 Herpesviral infection of urogenital system, unspecified: Secondary | ICD-10-CM | POA: Diagnosis not present

## 2018-03-03 DIAGNOSIS — O99824 Streptococcus B carrier state complicating childbirth: Secondary | ICD-10-CM | POA: Diagnosis not present

## 2018-03-03 DIAGNOSIS — O9832 Other infections with a predominantly sexual mode of transmission complicating childbirth: Secondary | ICD-10-CM | POA: Diagnosis not present

## 2018-03-03 DIAGNOSIS — E669 Obesity, unspecified: Secondary | ICD-10-CM | POA: Diagnosis not present

## 2018-03-03 DIAGNOSIS — O34211 Maternal care for low transverse scar from previous cesarean delivery: Secondary | ICD-10-CM | POA: Diagnosis not present

## 2018-03-03 LAB — CBC
HCT: 35.1 % — ABNORMAL LOW (ref 36.0–46.0)
Hemoglobin: 11.4 g/dL — ABNORMAL LOW (ref 12.0–15.0)
MCH: 28.6 pg (ref 26.0–34.0)
MCHC: 32.5 g/dL (ref 30.0–36.0)
MCV: 88.2 fL (ref 78.0–100.0)
PLATELETS: 145 10*3/uL — AB (ref 150–400)
RBC: 3.98 MIL/uL (ref 3.87–5.11)
RDW: 14.7 % (ref 11.5–15.5)
WBC: 6.3 10*3/uL (ref 4.0–10.5)

## 2018-03-03 LAB — COMPREHENSIVE METABOLIC PANEL
ALBUMIN: 3.1 g/dL — AB (ref 3.5–5.0)
ALT: 11 U/L — AB (ref 14–54)
AST: 21 U/L (ref 15–41)
Alkaline Phosphatase: 150 U/L — ABNORMAL HIGH (ref 38–126)
Anion gap: 12 (ref 5–15)
CHLORIDE: 106 mmol/L (ref 101–111)
CO2: 18 mmol/L — AB (ref 22–32)
CREATININE: 0.44 mg/dL (ref 0.44–1.00)
Calcium: 8.7 mg/dL — ABNORMAL LOW (ref 8.9–10.3)
GFR calc Af Amer: >60 mL/min (ref >60)
GLUCOSE: 119 mg/dL — AB (ref 65–99)
POTASSIUM: 3.5 mmol/L (ref 3.5–5.1)
SODIUM: 136 mmol/L (ref 135–145)
Total Bilirubin: 0.5 mg/dL (ref 0.3–1.2)
Total Protein: 6.4 g/dL — ABNORMAL LOW (ref 6.5–8.1)

## 2018-03-03 LAB — TYPE AND SCREEN
ABO/RH(D): A POS
Antibody Screen: NEGATIVE

## 2018-03-03 LAB — ABO/RH: ABO/RH(D): A POS

## 2018-03-04 ENCOUNTER — Inpatient Hospital Stay (HOSPITAL_COMMUNITY): Payer: 59 | Admitting: Anesthesiology

## 2018-03-04 ENCOUNTER — Encounter (HOSPITAL_COMMUNITY)
Admission: AD | Disposition: A | Discharge status: Home / Self Care | Payer: Self-pay | Source: Ambulatory Visit | Attending: Obstetrics and Gynecology

## 2018-03-04 ENCOUNTER — Encounter (HOSPITAL_COMMUNITY): Payer: Self-pay | Admitting: *Deleted

## 2018-03-04 ENCOUNTER — Inpatient Hospital Stay (HOSPITAL_COMMUNITY)
Admission: AD | Admit: 2018-03-04 | Discharge: 2018-03-06 | DRG: 786 | Disposition: A | Discharge status: Home / Self Care | Payer: 59 | Source: Ambulatory Visit | Attending: Obstetrics and Gynecology | Admitting: Obstetrics and Gynecology

## 2018-03-04 DIAGNOSIS — O99824 Streptococcus B carrier state complicating childbirth: Secondary | ICD-10-CM | POA: Diagnosis present

## 2018-03-04 DIAGNOSIS — O34211 Maternal care for low transverse scar from previous cesarean delivery: Secondary | ICD-10-CM | POA: Diagnosis not present

## 2018-03-04 DIAGNOSIS — O99810 Abnormal glucose complicating pregnancy: Secondary | ICD-10-CM | POA: Diagnosis not present

## 2018-03-04 DIAGNOSIS — Z98891 History of uterine scar from previous surgery: Secondary | ICD-10-CM

## 2018-03-04 DIAGNOSIS — E669 Obesity, unspecified: Secondary | ICD-10-CM | POA: Diagnosis present

## 2018-03-04 DIAGNOSIS — Z3A39 39 weeks gestation of pregnancy: Secondary | ICD-10-CM | POA: Diagnosis not present

## 2018-03-04 DIAGNOSIS — O9832 Other infections with a predominantly sexual mode of transmission complicating childbirth: Secondary | ICD-10-CM | POA: Diagnosis present

## 2018-03-04 DIAGNOSIS — A6000 Herpesviral infection of urogenital system, unspecified: Secondary | ICD-10-CM | POA: Diagnosis present

## 2018-03-04 DIAGNOSIS — O99214 Obesity complicating childbirth: Secondary | ICD-10-CM | POA: Diagnosis present

## 2018-03-04 DIAGNOSIS — D6959 Other secondary thrombocytopenia: Secondary | ICD-10-CM | POA: Diagnosis present

## 2018-03-04 DIAGNOSIS — O2432 Unspecified pre-existing diabetes mellitus in childbirth: Secondary | ICD-10-CM | POA: Diagnosis present

## 2018-03-04 DIAGNOSIS — O9912 Other diseases of the blood and blood-forming organs and certain disorders involving the immune mechanism complicating childbirth: Secondary | ICD-10-CM | POA: Diagnosis present

## 2018-03-04 DIAGNOSIS — O34219 Maternal care for unspecified type scar from previous cesarean delivery: Secondary | ICD-10-CM | POA: Diagnosis not present

## 2018-03-04 LAB — GLUCOSE, CAPILLARY
Glucose-Capillary: 91 mg/dL (ref 65–99)
Glucose-Capillary: 80 mg/dL (ref 65–99)

## 2018-03-04 LAB — RPR: RPR Ser Ql: NONREACTIVE

## 2018-03-04 SURGERY — Surgical Case
Anesthesia: Spinal | Site: Abdomen | Wound class: Clean Contaminated

## 2018-03-04 MED ORDER — LACTATED RINGERS IV SOLN
INTRAVENOUS | Status: DC | PRN
  Administered 2018-03-04: 13:00:00 via INTRAVENOUS

## 2018-03-04 MED ORDER — SOD CITRATE-CITRIC ACID 500-334 MG/5ML PO SOLN
ORAL | Status: AC
  Filled 2018-03-04: qty 15

## 2018-03-04 MED ORDER — DEXAMETHASONE SODIUM PHOSPHATE 4 MG/ML IJ SOLN
INTRAMUSCULAR | Status: AC
  Filled 2018-03-04: qty 1

## 2018-03-04 MED ORDER — OXYTOCIN 10 UNIT/ML IJ SOLN
INTRAVENOUS | Status: DC | PRN
  Administered 2018-03-04: 40 [IU] via INTRAVENOUS

## 2018-03-04 MED ORDER — TETANUS-DIPHTH-ACELL PERTUSSIS 5-2.5-18.5 LF-MCG/0.5 IM SUSP: 0.5000 mL | Freq: Once | INTRAMUSCULAR | Status: DC

## 2018-03-04 MED ORDER — IBUPROFEN 600 MG PO TABS
600.0000 mg | ORAL_TABLET | Freq: Four times a day (QID) | ORAL | Status: DC
  Administered 2018-03-04 – 2018-03-06 (×8): 600 mg via ORAL
  Filled 2018-03-04 (×8): qty 1

## 2018-03-04 MED ORDER — DIPHENHYDRAMINE HCL 25 MG PO CAPS: 25.0000 mg | ORAL_CAPSULE | Freq: Four times a day (QID) | ORAL | Status: DC | PRN

## 2018-03-04 MED ORDER — SIMETHICONE 80 MG PO CHEW
80.0000 mg | CHEWABLE_TABLET | ORAL | Status: DC
  Administered 2018-03-05 (×2): 80 mg via ORAL
  Filled 2018-03-04 (×2): qty 1

## 2018-03-04 MED ORDER — NALBUPHINE HCL 10 MG/ML IJ SOLN: 5.0000 mg | Freq: Once | INTRAMUSCULAR | Status: DC | PRN

## 2018-03-04 MED ORDER — OXYTOCIN 10 UNIT/ML IJ SOLN
INTRAMUSCULAR | Status: AC
  Filled 2018-03-04: qty 4

## 2018-03-04 MED ORDER — SIMETHICONE 80 MG PO CHEW: 80.0000 mg | CHEWABLE_TABLET | ORAL | Status: DC | PRN

## 2018-03-04 MED ORDER — SCOPOLAMINE 1 MG/3DAYS TD PT72
1.0000 | MEDICATED_PATCH | Freq: Once | TRANSDERMAL | Status: DC
  Administered 2018-03-04: 1.5 mg via TRANSDERMAL

## 2018-03-04 MED ORDER — METOCLOPRAMIDE HCL 5 MG/ML IJ SOLN
INTRAMUSCULAR | Status: AC
  Filled 2018-03-04: qty 2

## 2018-03-04 MED ORDER — SCOPOLAMINE 1 MG/3DAYS TD PT72
MEDICATED_PATCH | TRANSDERMAL | Status: AC
  Filled 2018-03-04: qty 1

## 2018-03-04 MED ORDER — NALBUPHINE HCL 10 MG/ML IJ SOLN: 5.0000 mg | INTRAMUSCULAR | Status: DC | PRN

## 2018-03-04 MED ORDER — DIPHENHYDRAMINE HCL 50 MG/ML IJ SOLN: 12.5000 mg | INTRAMUSCULAR | Status: DC | PRN

## 2018-03-04 MED ORDER — COCONUT OIL OIL
1.0000 "application " | TOPICAL_OIL | Status: DC | PRN
  Administered 2018-03-05: 1 via TOPICAL
  Filled 2018-03-04: qty 120

## 2018-03-04 MED ORDER — FENTANYL CITRATE (PF) 100 MCG/2ML IJ SOLN
INTRAMUSCULAR | Status: AC
  Filled 2018-03-04: qty 2

## 2018-03-04 MED ORDER — PHENYLEPHRINE 8 MG IN D5W 100 ML (0.08MG/ML) PREMIX OPTIME
INJECTION | INTRAVENOUS | Status: AC
Start: 2018-03-04 — End: ?
  Filled 2018-03-04: qty 100

## 2018-03-04 MED ORDER — NALBUPHINE HCL 10 MG/ML IJ SOLN
5.0000 mg | INTRAMUSCULAR | Status: DC | PRN
  Administered 2018-03-04 – 2018-03-05 (×3): 5 mg via SUBCUTANEOUS
  Filled 2018-03-04 (×2): qty 1

## 2018-03-04 MED ORDER — BUPIVACAINE IN DEXTROSE 0.75-8.25 % IT SOLN
INTRATHECAL | Status: DC | PRN
  Administered 2018-03-04: 1.4 mL via INTRATHECAL

## 2018-03-04 MED ORDER — PHENYLEPHRINE HCL 10 MG/ML IJ SOLN
INTRAMUSCULAR | Status: DC | PRN
  Administered 2018-03-04: 40 ug via INTRAVENOUS
  Administered 2018-03-04: 80 ug via INTRAVENOUS
  Administered 2018-03-04: 40 ug via INTRAVENOUS

## 2018-03-04 MED ORDER — ONDANSETRON HCL 4 MG/2ML IJ SOLN: 4.0000 mg | Freq: Three times a day (TID) | INTRAMUSCULAR | Status: DC | PRN

## 2018-03-04 MED ORDER — PHENYLEPHRINE 8 MG IN D5W 100 ML (0.08MG/ML) PREMIX OPTIME
INJECTION | INTRAVENOUS | Status: DC | PRN
  Administered 2018-03-04: 60 ug/min via INTRAVENOUS

## 2018-03-04 MED ORDER — OXYTOCIN 40 UNITS IN LACTATED RINGERS INFUSION - SIMPLE MED: 2.5000 [IU]/h | INTRAVENOUS | Status: AC

## 2018-03-04 MED ORDER — FENTANYL CITRATE (PF) 100 MCG/2ML IJ SOLN
25.0000 ug | INTRAMUSCULAR | Status: DC | PRN
  Administered 2018-03-04: 25 ug via INTRAVENOUS
  Administered 2018-03-04: 50 ug via INTRAVENOUS

## 2018-03-04 MED ORDER — METOCLOPRAMIDE HCL 5 MG/ML IJ SOLN
INTRAMUSCULAR | Status: DC | PRN
  Administered 2018-03-04: 10 mg via INTRAVENOUS

## 2018-03-04 MED ORDER — NALOXONE HCL 0.4 MG/ML IJ SOLN: 0.4000 mg | INTRAMUSCULAR | Status: DC | PRN

## 2018-03-04 MED ORDER — LACTATED RINGERS IV SOLN
INTRAVENOUS | Status: DC
  Administered 2018-03-04: 10:00:00 via INTRAVENOUS

## 2018-03-04 MED ORDER — SENNOSIDES-DOCUSATE SODIUM 8.6-50 MG PO TABS
2.0000 | ORAL_TABLET | ORAL | Status: DC
  Administered 2018-03-05 (×2): 2 via ORAL
  Filled 2018-03-04 (×2): qty 2

## 2018-03-04 MED ORDER — NALOXONE HCL 4 MG/10ML IJ SOLN: 1.0000 ug/kg/h | INTRAVENOUS | Status: DC | PRN

## 2018-03-04 MED ORDER — ONDANSETRON HCL 4 MG/2ML IJ SOLN
INTRAMUSCULAR | Status: DC | PRN
  Administered 2018-03-04: 4 mg via INTRAVENOUS

## 2018-03-04 MED ORDER — MEPERIDINE HCL 25 MG/ML IJ SOLN: 6.2500 mg | INTRAMUSCULAR | Status: DC | PRN

## 2018-03-04 MED ORDER — MORPHINE SULFATE (PF) 0.5 MG/ML IJ SOLN
INTRAMUSCULAR | Status: AC
  Filled 2018-03-04: qty 10

## 2018-03-04 MED ORDER — DIBUCAINE 1 % RE OINT: 1.0000 "application " | TOPICAL_OINTMENT | RECTAL | Status: DC | PRN

## 2018-03-04 MED ORDER — ONDANSETRON HCL 4 MG/2ML IJ SOLN
INTRAMUSCULAR | Status: AC
Start: 2018-03-04 — End: ?
  Filled 2018-03-04: qty 2

## 2018-03-04 MED ORDER — LACTATED RINGERS IV SOLN
INTRAVENOUS | Status: DC
  Administered 2018-03-04: 11:00:00 via INTRAVENOUS

## 2018-03-04 MED ORDER — MENTHOL 3 MG MT LOZG: 1.0000 | LOZENGE | OROMUCOSAL | Status: DC | PRN

## 2018-03-04 MED ORDER — ACETAMINOPHEN 325 MG PO TABS: 650.0000 mg | ORAL_TABLET | ORAL | Status: DC | PRN

## 2018-03-04 MED ORDER — OXYCODONE-ACETAMINOPHEN 5-325 MG PO TABS: 1.0000 | ORAL_TABLET | ORAL | Status: DC | PRN

## 2018-03-04 MED ORDER — NALBUPHINE HCL 10 MG/ML IJ SOLN
INTRAMUSCULAR | Status: AC
  Filled 2018-03-04: qty 1

## 2018-03-04 MED ORDER — BUPIVACAINE IN DEXTROSE 0.75-8.25 % IT SOLN
INTRATHECAL | Status: AC
  Filled 2018-03-04: qty 2

## 2018-03-04 MED ORDER — SOD CITRATE-CITRIC ACID 500-334 MG/5ML PO SOLN
30.0000 mL | Freq: Once | ORAL | Status: AC
  Administered 2018-03-04: 30 mL via ORAL

## 2018-03-04 MED ORDER — OXYCODONE-ACETAMINOPHEN 5-325 MG PO TABS
2.0000 | ORAL_TABLET | ORAL | Status: DC | PRN
  Administered 2018-03-05 – 2018-03-06 (×6): 2 via ORAL
  Filled 2018-03-04 (×6): qty 2

## 2018-03-04 MED ORDER — SODIUM CHLORIDE 0.9% FLUSH: 3.0000 mL | INTRAVENOUS | Status: DC | PRN

## 2018-03-04 MED ORDER — DIPHENHYDRAMINE HCL 25 MG PO CAPS
25.0000 mg | ORAL_CAPSULE | ORAL | Status: DC | PRN
  Administered 2018-03-04 – 2018-03-06 (×4): 25 mg via ORAL
  Filled 2018-03-04 (×4): qty 1

## 2018-03-04 MED ORDER — FENTANYL CITRATE (PF) 100 MCG/2ML IJ SOLN
INTRAMUSCULAR | Status: DC | PRN
  Administered 2018-03-04: 20 ug via INTRATHECAL

## 2018-03-04 MED ORDER — PHENYLEPHRINE 40 MCG/ML (10ML) SYRINGE FOR IV PUSH (FOR BLOOD PRESSURE SUPPORT)
PREFILLED_SYRINGE | INTRAVENOUS | Status: AC
Start: 2018-03-04 — End: ?
  Filled 2018-03-04: qty 10

## 2018-03-04 MED ORDER — SODIUM CHLORIDE 0.9 % IR SOLN
Status: DC | PRN
  Administered 2018-03-04: 1

## 2018-03-04 MED ORDER — SIMETHICONE 80 MG PO CHEW
80.0000 mg | CHEWABLE_TABLET | Freq: Three times a day (TID) | ORAL | Status: DC
  Administered 2018-03-04 – 2018-03-06 (×5): 80 mg via ORAL
  Filled 2018-03-04 (×5): qty 1

## 2018-03-04 MED ORDER — LACTATED RINGERS IV SOLN
INTRAVENOUS | Status: DC
  Administered 2018-03-04: 21:00:00 via INTRAVENOUS

## 2018-03-04 MED ORDER — DEXAMETHASONE SODIUM PHOSPHATE 4 MG/ML IJ SOLN
INTRAMUSCULAR | Status: DC | PRN
  Administered 2018-03-04: 4 mg via INTRAVENOUS

## 2018-03-04 MED ORDER — MORPHINE SULFATE (PF) 0.5 MG/ML IJ SOLN
INTRAMUSCULAR | Status: DC | PRN
  Administered 2018-03-04: 200 ug via INTRATHECAL

## 2018-03-04 MED ORDER — WITCH HAZEL-GLYCERIN EX PADS: 1.0000 "application " | MEDICATED_PAD | CUTANEOUS | Status: DC | PRN

## 2018-03-04 MED ORDER — CEFAZOLIN SODIUM-DEXTROSE 2-4 GM/100ML-% IV SOLN
2.0000 g | INTRAVENOUS | Status: AC
  Administered 2018-03-04: 2 g via INTRAVENOUS
  Filled 2018-03-04: qty 100

## 2018-03-04 MED ORDER — NALBUPHINE HCL 10 MG/ML IJ SOLN
5.0000 mg | Freq: Once | INTRAMUSCULAR | Status: DC | PRN
Start: 2018-03-04 — End: 2018-03-06

## 2018-03-04 MED ORDER — PRENATAL MULTIVITAMIN CH
1.0000 | ORAL_TABLET | Freq: Every day | ORAL | Status: DC
  Administered 2018-03-05 – 2018-03-06 (×2): 1 via ORAL
  Filled 2018-03-04 (×2): qty 1

## 2018-03-04 SURGICAL SUPPLY — 35 items
APL SKNCLS STERI-STRIP NONHPOA (GAUZE/BANDAGES/DRESSINGS) ×1
BENZOIN TINCTURE PRP APPL 2/3 (GAUZE/BANDAGES/DRESSINGS) ×3 IMPLANT
CHLORAPREP W/TINT 26ML (MISCELLANEOUS) ×3 IMPLANT
CLAMP CORD UMBIL (MISCELLANEOUS) IMPLANT
CLOSURE STERI STRIP 1/2 X4 (GAUZE/BANDAGES/DRESSINGS) ×2 IMPLANT
CLOSURE WOUND 1/2 X4 (GAUZE/BANDAGES/DRESSINGS)
CLOTH BEACON ORANGE TIMEOUT ST (SAFETY) ×3 IMPLANT
DRSG OPSITE POSTOP 4X10 (GAUZE/BANDAGES/DRESSINGS) ×3 IMPLANT
ELECT REM PT RETURN 9FT ADLT (ELECTROSURGICAL) ×3
ELECTRODE REM PT RTRN 9FT ADLT (ELECTROSURGICAL) ×1 IMPLANT
EXTRACTOR VACUUM KIWI (MISCELLANEOUS) ×3 IMPLANT
GLOVE BIOGEL PI IND STRL 6.5 (GLOVE) ×1 IMPLANT
GLOVE BIOGEL PI IND STRL 7.0 (GLOVE) ×2 IMPLANT
GLOVE BIOGEL PI INDICATOR 6.5 (GLOVE) ×2
GLOVE BIOGEL PI INDICATOR 7.0 (GLOVE) ×4
GLOVE ECLIPSE 6.5 STRL STRAW (GLOVE) ×3 IMPLANT
GOWN STRL REUS W/TWL LRG LVL3 (GOWN DISPOSABLE) ×6 IMPLANT
KIT ABG SYR 3ML LUER SLIP (SYRINGE) IMPLANT
NDL HYPO 25X5/8 SAFETYGLIDE (NEEDLE) IMPLANT
NEEDLE HYPO 25X5/8 SAFETYGLIDE (NEEDLE) IMPLANT
NS IRRIG 1000ML POUR BTL (IV SOLUTION) ×3 IMPLANT
PACK C SECTION WH (CUSTOM PROCEDURE TRAY) ×3 IMPLANT
PAD OB MATERNITY 4.3X12.25 (PERSONAL CARE ITEMS) ×3 IMPLANT
PENCIL SMOKE EVAC W/HOLSTER (ELECTROSURGICAL) ×3 IMPLANT
STRIP CLOSURE SKIN 1/2X4 (GAUZE/BANDAGES/DRESSINGS) IMPLANT
SUT MNCRL 0 VIOLET CTX 36 (SUTURE) ×2 IMPLANT
SUT MONOCRYL 0 CTX 36 (SUTURE) ×4
SUT PDS AB 0 CTX 36 PDP370T (SUTURE) IMPLANT
SUT PLAIN 2 0 XLH (SUTURE) IMPLANT
SUT VIC AB 0 CT1 36 (SUTURE) IMPLANT
SUT VIC AB 3-0 CT1 27 (SUTURE) ×3
SUT VIC AB 3-0 CT1 TAPERPNT 27 (SUTURE) IMPLANT
SUT VIC AB 4-0 PS2 27 (SUTURE) ×3 IMPLANT
TOWEL OR 17X24 6PK STRL BLUE (TOWEL DISPOSABLE) ×3 IMPLANT
TRAY FOLEY W/BAG SLVR 14FR LF (SET/KITS/TRAYS/PACK) IMPLANT

## 2018-03-04 NOTE — Transfer of Care (Signed)
Immediate Anesthesia Transfer of Care Note  Patient: [name]  Procedure(s) Performed: Repeat CESAREAN SECTION (N/A Abdomen)  Patient Location: PACU  Anesthesia Type:Spinal  Level of Consciousness: awake, alert  and oriented  Airway & Oxygen Therapy: Patient Spontanous Breathing  Post-op Assessment: Report given to RN and Post -op Vital signs reviewed and stable  Post vital signs: Reviewed and stable  Last Vitals:  Vitals Value Taken Time  BP 123/102 03/04/2018  2:15 PM  Temp    Pulse 69 03/04/2018  2:15 PM  Resp 14 03/04/2018  2:15 PM  SpO2 100 % 03/04/2018  2:15 PM  Vitals shown include unvalidated device data.  Last Pain:  Vitals:   03/04/18 0950  TempSrc: Oral  PainSc: 0-No pain      Patients Stated Pain Goal: 4 (23/95/32 0233)  Complications: No apparent anesthesia complications

## 2018-03-04 NOTE — Anesthesia Preprocedure Evaluation (Addendum)
Anesthesia Evaluation  Patient identified by MRN, date of birth, ID band Patient awake    Reviewed: Allergy & Precautions, NPO status , Patient's Chart, lab work & pertinent test results  Airway Mallampati: II  TM Distance: >3 FB Neck ROM: Full    Dental no notable dental hx. (+) Teeth Intact   Pulmonary    Pulmonary exam normal breath sounds clear to auscultation       Cardiovascular negative cardio ROS Normal cardiovascular exam Rhythm:Regular Rate:Normal     Neuro/Psych Seizures -,  Hx/o Seizure like activity, on no Rx negative psych ROS   GI/Hepatic Neg liver ROS, GERD  Medicated and Controlled,  Endo/Other  diabetes, GestationalObesity  Renal/GU   negative genitourinary   Musculoskeletal   Abdominal (+) + obese,   Peds  Hematology  (+) anemia , Thrombocytopenia- mild   Anesthesia Other Findings   Reproductive/Obstetrics (+) Pregnancy Previous C/section HSV                            Anesthesia Physical Anesthesia Plan  ASA: II  Anesthesia Plan: Spinal   Post-op Pain Management:    Induction:   PONV Risk Score and Plan: 4 or greater and Scopolamine patch - Pre-op, Dexamethasone, Ondansetron and Treatment may vary due to age or medical condition  Airway Management Planned: Natural Airway  Additional Equipment:   Intra-op Plan:   Post-operative Plan:   Informed Consent: I have reviewed the patients History and Physical, chart, labs and discussed the procedure including the risks, benefits and alternatives for the proposed anesthesia with the patient or authorized representative who has indicated his/her understanding and acceptance.   Dental advisory given  Plan Discussed with: CRNA and Surgeon  Anesthesia Plan Comments:         Anesthesia Quick Evaluation

## 2018-03-04 NOTE — Addendum Note (Signed)
Addendum  created 03/04/18 1830 by Asher Muir, CRNA   Sign clinical note

## 2018-03-04 NOTE — Anesthesia Postprocedure Evaluation (Signed)
Anesthesia Post Note  Patient: [name]  Procedure(s) Performed: Repeat CESAREAN SECTION (N/A Abdomen)     Patient location during evaluation: PACU Anesthesia Type: Spinal Level of consciousness: oriented and awake and alert Pain management: pain level controlled Vital Signs Assessment: post-procedure vital signs reviewed and stable Respiratory status: spontaneous breathing, respiratory function stable and nonlabored ventilation Cardiovascular status: blood pressure returned to baseline and stable Postop Assessment: no headache, no backache, no apparent nausea or vomiting, spinal receding and patient able to bend at knees Anesthetic complications: no    Last Vitals:  Vitals:   03/04/18 1445 03/04/18 1500  BP: (!) 109/45 106/64  Pulse: 72 71  Resp: 19 (!) 25  Temp:  36.7 C  SpO2: 100% 100%    Last Pain:  Vitals:   03/04/18 1500  TempSrc: Oral  PainSc: 0-No pain   Pain Goal: Patients Stated Pain Goal: 4 (03/04/18 0950)               Thekla Colborn A.

## 2018-03-04 NOTE — Op Note (Signed)
Cesarean Section Procedure Note   MIKALYN HERMIDA  03/04/2018  Indications: Scheduled Proceedure/Maternal Request   Pre-operative Diagnosis: Previous Cesarean Section; iup at 39.1 wga  Post-operative Diagnosis: Same   Surgeon: Juliann Mule) and Role:    * Delorus Langwell, Earlyne Iba, MD - Primary   Assistants: Derrell Lolling, CNM  Anesthesia: spinal   Procedure Details:  The patient was seen in the Holding Room. The risks, benefits, complications, treatment options, and expected outcomes were discussed with the patient. The patient concurred with the proposed plan, giving informed consent. identified as [name] and the procedure verified as C-Section Delivery. A Time Out was held and the above information confirmed.  After induction of anesthesia, the patient was draped and prepped in the usual sterile manner. A transverse was made and carried down through the subcutaneous tissue to the fascia. Fascial incision was made and extended transversely. The fascia was separated from the underlying rectus tissue superiorly and inferiorly. The peritoneum was identified and entered. Peritoneal incision was extended longitudinally. The utero-vesical peritoneal reflection was incised transversely and the bladder flap was bluntly freed from the lower uterine segment. A low transverse uterine incision was made. Delivered from cephalic presentation was a viable female infant.  The umbilical cord was clamped and cut cord blood was obtained for evaluation. The placenta was removed Intact , spontaneously, and appeared normal. The uterine outline, tubes and ovaries appeared normal}. The uterus was then exteriorized and cleared of all clot and debris.  The uterine incision was closed with running locked sutures of 0Vicryl.   An embrocating layer was then performed.  Hemostasis was observedand the uterus was placed back into the abdomen.  The uterine incision was again examined and excellent hemostasis was noted. Lavage  was carried out with moist laps until clear. The fascia was then reapproximated with running sutures of 0 PDS. Irrigation was done and hemostasis noted.  The subcuticular closure was performed using 3-0Vicryl. The skin was closed with 4-0Vicryl.   Instrument, sponge, and needle counts were correct prior the abdominal closure and were correct at the conclusion of the case.    Findings: viable female infant, apgars 8/9, weight not yet done   Estimated Blood Loss: 879 mL   Total IV Fluids: 1417ml   Urine Output: 100CC OF clear urine  Specimens: @ORSPECIMEN @   Complications: no complications  Disposition: PACU - hemodynamically stable.   Maternal Condition: stable   Baby condition / location:  Couplet care / Skin to Skin  Attending Attestation: I was present and scrubbed for the entire procedure.   Signed: Surgeon(s): Murrell Redden Earlyne Iba, MD

## 2018-03-04 NOTE — Anesthesia Procedure Notes (Addendum)
Spinal  Patient location during procedure: OR Start time: 03/04/2018 12:57 PM End time: 03/04/2018 1:04 PM Staffing Anesthesiologist: Josephine Igo, MD Resident/CRNA: Flossie Dibble, CRNA Performed: other anesthesia staff  Preanesthetic Checklist Completed: patient identified, site marked, surgical consent, pre-op evaluation, timeout performed, IV checked, risks and benefits discussed and monitors and equipment checked Spinal Block Patient position: sitting Prep: DuraPrep Patient monitoring: continuous pulse ox, blood pressure and heart rate Approach: midline Location: L3-4 Injection technique: single-shot Needle Needle type: Pencan  Needle gauge: 24 G Needle length: 10 cm Assessment Sensory level: T4

## 2018-03-04 NOTE — Anesthesia Procedure Notes (Signed)
Spinal  Patient location during procedure: OR Start time: 03/04/2018 1:04 PM Staffing Anesthesiologist: Josephine Igo, MD Performed: anesthesiologist  Preanesthetic Checklist Completed: patient identified, site marked, surgical consent, pre-op evaluation, timeout performed, IV checked, risks and benefits discussed and monitors and equipment checked Spinal Block Patient position: sitting Prep: site prepped and draped and DuraPrep Patient monitoring: heart rate, cardiac monitor, continuous pulse ox and blood pressure Approach: midline Location: L4-5 Injection technique: single-shot Needle Needle type: Pencan  Needle gauge: 24 G Needle length: 9 cm Needle insertion depth: 7 cm Assessment Sensory level: T4 Additional Notes Patient tolerated procedure well. Multiple attempts. Adequate sensory level.

## 2018-03-04 NOTE — Anesthesia Postprocedure Evaluation (Signed)
Anesthesia Post Note  Patient: [name]  Procedure(s) Performed: Repeat CESAREAN SECTION (N/A Abdomen)     Patient location during evaluation: Mother Baby Anesthesia Type: Spinal Level of consciousness: awake Pain management: satisfactory to patient Vital Signs Assessment: post-procedure vital signs reviewed and stable Respiratory status: spontaneous breathing Cardiovascular status: stable Anesthetic complications: no    Last Vitals:  Vitals:   03/04/18 1635 03/04/18 1800  BP: (!) 111/53 (!) 106/56  Pulse: 61 72  Resp: 18 19  Temp: 36.6 C 36.7 C  SpO2: 97% 97%    Last Pain:  Vitals:   03/04/18 1800  TempSrc: Oral  PainSc:    Pain Goal: Patients Stated Pain Goal: 4 (03/04/18 0950)               Casimer Lanius

## 2018-03-04 NOTE — Progress Notes (Signed)
CBG in PACU 80 - CWhitlockRNC

## 2018-03-04 NOTE — H&P (Signed)
[name] is a 22 y.o. female G3P1011 at 38.1 wga presenting for repeat c-sectiion.  H/o ltcs and desires repeat.  Antepartum course complicated by pre-gestational dm with nml bs during precnancy, nml antenatal testing and nml fetal echo, eye exam and ekg.  New diagnosis of hsv, treated and on suppression.   No current outbreak. H/o gestational thrombocytopenia.  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  Fetus with isolated finding of slightly enlarged cisterna magna, s/p mfm consult, neg quad screen and no further recommendations.  She denies ctx, lof, vb and notes +FM. PNCare at [facility] since 10 wks History OB History    Gravida  3   Para  1   Term  1   Preterm      AB  1   Living  1     SAB  1   TAB      Ectopic      Multiple      Live Births  1          Past Medical History:  Diagnosis Date  . Endometriosis   . Gestational diabetes    diet controlled  . Otitis media   . Seizures ([employer])    Past Surgical History:  Procedure Laterality Date  . CESAREAN SECTION    . CHOLECYSTECTOMY    . LAPAROSCOPY  2012   diagnose endometriosis  . TONSILLECTOMY     Family History: family history includes Diabetes in her mother; Healthy in her father and mother; Hypertension in her brother. Social History:  reports that she has never smoked. She has never used smokeless tobacco. She reports that she does not drink alcohol or use drugs.  ROS    Blood pressure (!) 120/99, pulse 81, temperature 97.7 F (36.5 C), temperature source Oral, resp. rate 18, height 5\' 3"  (1.6 m), weight 204 lb 1.6 oz (92.6 kg), last menstrual period 05/23/2017. Exam Physical Exam  Prenatal labs: ABO, Rh: --/--/A POS, A POS Performed at [facility], [address], [place], [place] [address]  [phone]/23 1117) Antibody: NEG (05/23 1117) Rubella: Immune (10/30 0000) RPR: Non Reactive (05/23 1117)  HBsAg: Negative (10/30 0000)  HIV: Non-reactive (10/30 0000)  GBS: Positive (04/29 0000)   1 hr Glucola - early gtt abnml, failed 3 hr gtt Genetic screening - neg quad Anatomy US - CPC that resolved; mildly enlarged cisterna magna  Physical Exam:  A&O x 3 HEENT - wnl Lungs : ctab CV : rrr Abdo : soft, nt, gravid Extr : +1 le edema Pelvic : deferred  Fht: cat 1 Toco: no tcx  Results for orders placed or performed during the hospital encounter of 03/04/18 (from the past 24 hour(s))  Glucose, capillary     Status: None   Collection Time: 03/04/18 10:00 AM  Result Value Ref Range   Glucose-Capillary 91 65 - 99 mg/dL   CBC Latest Ref Rng & Units 03/03/2018 08/10/2015 09/19/2013  WBC 4.0 - 10.5 K/uL 6.3 6.0 6.8  Hemoglobin 12.0 - 15.0 g/dL 11.4(L) 14.5 11.7(L)  Hematocrit 36.0 - 46.0 % 35.1(L) 42.9 36.1  Platelets 150 - 400 K/uL 145(L) 165 147(L)     Assessment/Plan: iup at 39.1 with h/o ltcs 1. Desires rltcs - proceed to OR; reviewed risk of bleeding, infection, injury to bowel, bladder, nerves, blood vessels, risk of further surgery, risk of blood clot to lung/leg; risk of anesthesia 2. Gest. Thrombocytopenia 3. Pre-gestational dm, stable and controlled 4l gbs pos 5. Seizure d/o,  stable   Charyl Bigger 03/04/2018, 11:35 AM

## 2018-03-05 LAB — CBC
HEMATOCRIT: 32.3 % — AB (ref 36.0–46.0)
Hemoglobin: 10.5 g/dL — ABNORMAL LOW (ref 12.0–15.0)
MCH: 28.8 pg (ref 26.0–34.0)
MCHC: 32.5 g/dL (ref 30.0–36.0)
MCV: 88.7 fL (ref 78.0–100.0)
Platelets: 143 10*3/uL — ABNORMAL LOW (ref 150–400)
RBC: 3.64 MIL/uL — AB (ref 3.87–5.11)
RDW: 14.6 % (ref 11.5–15.5)
WBC: 8.7 10*3/uL (ref 4.0–10.5)

## 2018-03-05 NOTE — Lactation Note (Signed)
This note was copied from a baby's chart. Lactation Consultation Note  Patient Name: [name] OMVEH'M Date: 03/05/2018 Reason for consult: Initial assessment;Term  28 hours old FT who is being partially BF and formula fed by his mother, that was her feeding choice upon admission. She's a P2 and experienced BF, she was able to BF her first child for 6 weeks until she had her gallbladder surgery and was forced to pump & dump more than 40 oz of breastmilk; after her surgery her milk just never came back and she decided to fully formula feed her first baby.  Baby was swaddled in visitor's arms when entering the room, offered assistance with latch and mom thanked [facility] for asking visitors to step out. Baby already had some formula about an hour ago and he probably wasn't ready to feed, he wouldn't wake up; but did get to open his mouth wide with flanged lips; no sucking reflex elicit though, baby too sleepy to feed. Advised mom to try STS on the next feeding, both, mom and baby were wearing clothes (and a bra) when we attempted to feed baby in football position to the left breast. Noticed that baby has a recessed chin and possibly a mild tongue restriction but mom asked pediatrician to out rule a tongue tie and he did. Baby is able to protrude his tongue only slightly. Asked mom to call for latch assistance if needed.  Mom was able to hand express some colostrum, both breast looked intact upon examination with no signs of trauma, but mom complained of soreness, she's already using coconut oil and comfort gels. Reviewed use of both and for sore nipples. Offered mom to set up with a pump if latching becomes too painful, she'll start using her hand pump tonight and use her EBM to supplement baby, she'll still be using Gerber Gentle to complete the required volume according to baby's age.  Encouraged mom to keep feeding baby STS 8-12 times/24 hours or sooner if feeding cues are present. Mom will try to  pump after feedings and feed baby her EBM in a bottle. BF brochure, BF resources and feeding diary were reviewed. Both parents are aware of [facility] services and will call PRN.  Maternal Data Formula Feeding for Exclusion: Yes Reason for exclusion: Mother's choice to formula and breast feed on admission Has patient been taught Hand Expression?: Yes Does the patient have breastfeeding experience prior to this delivery?: Yes  Feeding Feeding Type: Breast Fed Length of feed: 5 min  LATCH Score Latch: Grasps breast easily, tongue down, lips flanged, rhythmical sucking.  Audible Swallowing: A few with stimulation  Type of Nipple: Everted at rest and after stimulation  Comfort (Breast/Nipple): Filling, red/small blisters or bruises, mild/mod discomfort  Hold (Positioning): Assistance needed to correctly position infant at breast and maintain latch.  LATCH Score: 7  Interventions Interventions: Breast feeding basics reviewed;Assisted with latch;Skin to skin;Breast massage;Hand express;Adjust position;Breast compression;Hand pump;Comfort gels;Coconut oil;Expressed milk;Support pillows  Lactation Tools Discussed/Used Tools: Comfort gels;Pump;Coconut oil Breast pump type: Manual WIC Program: Yes Pump Review: Setup, frequency, and cleaning;Milk Storage Initiated by:: MPeck Date initiated:: 03/05/18   Consult Status Consult Status: Follow-up Date: 03/06/18 Follow-up type: In-patient    [name] 03/05/2018, 6:00 PM

## 2018-03-05 NOTE — Progress Notes (Signed)
Subjective: POD# 1 Information for the patient's newborn:  [name], [name] [[id number]]  female    circ planned today Baby name: [name]  Reports feeling very well, itching controlled w/ Nubain SQ Feeding: breast Patient reports tolerating PO.  Breast symptoms: + colostrum Pain controlled with PO meds Denies HA/SOB/C/P/N/V/dizziness. Flatus present. She reports vaginal bleeding as normal, without clots.  She is ambulating, urinating without difficulty.     Objective:   VS:    Vitals:   03/04/18 2100 03/05/18 0015 03/05/18 0330 03/05/18 0755  BP:  (!) 100/54 117/64 99/60  Pulse:  (!) 58 64 (!) 57  Resp: 20 20 20 18   Temp: 98.2 F (36.8 C) 98.8 F (37.1 C) 98.5 F (36.9 C) 97.7 F (36.5 C)  TempSrc: Oral Oral Oral   SpO2: 97% 98% 99% 96%  Weight:      Height:          Intake/Output Summary (Last 24 hours) at 03/05/2018 1104 Last data filed at 03/05/2018 0330 Gross per 24 hour  Intake 1640 ml  Output 4629 ml  Net -2989 ml        Recent Labs    03/03/18 1117 03/05/18 0516  WBC 6.3 8.7  HGB 11.4* 10.5*  HCT 35.1* 32.3*  PLT 145* 143*     Blood type: --/--/A POS, A POS Performed at [facility], [address], [place], [place] [address]  (05/23 1117)  Rubella: Immune (10/30 0000)     Physical Exam:  General: alert, cooperative and no distress CV: Regular rate and rhythm Resp: clear Abdomen: soft, nontender, normal bowel sounds Incision: dry, intact and minimal drainage Uterine Fundus: firm, below umbilicus, nontender Lochia: minimal Ext: trace edema      Assessment/Plan: 22 y.o.   POD# 1. [id number]                  Principal Problem:   Cesarean delivery delivered - Repeat 5/24 Active Problems:   Postpartum care following vaginal delivery   Doing well, stable.               Advance diet as tolerated Encourage rest when baby rests Breastfeeding support Encourage to ambulate Routine post-op care  Juliene Pina, CNM, MSN 03/05/2018,  11:04 AM

## 2018-03-06 MED ORDER — SIMETHICONE 80 MG PO CHEW: 80.0000 mg | CHEWABLE_TABLET | ORAL | 0 refills | Status: DC | PRN

## 2018-03-06 MED ORDER — COCONUT OIL OIL: 1.0000 "application " | TOPICAL_OIL | 0 refills | Status: DC | PRN

## 2018-03-06 MED ORDER — IBUPROFEN 600 MG PO TABS: 600.0000 mg | ORAL_TABLET | Freq: Four times a day (QID) | ORAL | 0 refills | Status: DC

## 2018-03-06 MED ORDER — OXYCODONE-ACETAMINOPHEN 5-325 MG PO TABS: 1.0000 | ORAL_TABLET | ORAL | 0 refills | Status: DC | PRN

## 2018-03-06 NOTE — Lactation Note (Signed)
This note was copied from a baby's chart. Lactation Consultation Note  Patient Name: [name] BOFBP'Z Date: 03/06/2018 Reason for consult: Follow-up assessment;Term;Nipple pain/trauma;Infant weight loss;Engorgement(6% weight loss / resolving engorgement )  Baby is 19 hours old  LC reviewed and updated the doc flow sheets per mom and the feeding at consult.  Per mom milk is in and having engorgement in the middle of night , ice helped, and DEBP.  Per mom nipples feeling alittle sore. LC assessed with moms permission and noted the nipples  Just to be pink, no breakdown. LC reviewed hand expressing, and encouraged EBM to nipples liberally.  Sore nipple and engorgement prevention and tx reviewed. LC instructed mom on the use shells , and comfort  Gels.  Baby awake, large wet and stool changed.  LC reviewed latching in the cross cradle position to obtain depth and to counteract recessed chin,and short  Lingual frenulum. LC assessed prior to latch due to mom mentioning it. LC also noted a short labial frenulum  And the upper lip stretches well with exam and latched.  Baby latched with depth and multiple swallows noted / increased with breast compressions/ breast softening down/  Mom comfortable. Baby still feeding at 15 mins.  Per mom has  DEBP Medela at home and hand pump .  After feeding mom plans to pump 10 mins to relieve boarder line engorgement on the other breast.  LC recommended calling for ice packs if needed.  Mother informed of post-discharge support and given phone number to the lactation department, including services for phone call assistance; out-patient appointments; and breastfeeding support group. List of other breastfeeding resources in the community given in the handout. Encouraged mother to call for problems or concerns related to breastfeeding.   Maternal Data Has patient been taught Hand Expression?: Yes  Feeding Feeding Type: Breast Fed Length of feed: (depth  achieved / multiple swallows / still feeding 15 mins )  LATCH Score Latch: Grasps breast easily, tongue down, lips flanged, rhythmical sucking.  Audible Swallowing: Spontaneous and intermittent  Type of Nipple: Everted at rest and after stimulation  Comfort (Breast/Nipple): Filling, red/small blisters or bruises, mild/mod discomfort  Hold (Positioning): Assistance needed to correctly position infant at breast and maintain latch.  LATCH Score: 8  Interventions Interventions: Breast feeding basics reviewed;Assisted with latch;Skin to skin;Breast massage;Hand express;Breast compression;Adjust position;Support pillows;Position options;Shells;Hand pump;DEBP;Comfort gels  Lactation Tools Discussed/Used Tools: Shells;Pump;Comfort gels Shell Type: Inverted Breast pump type: Manual;Double-Electric Breast Pump   Consult Status Consult Status: Complete Date: 03/06/18    Myer Haff 03/06/2018, 9:28 AM

## 2018-03-06 NOTE — Discharge Summary (Signed)
OB Discharge Summary     Patient Name: [name] DOB: May 18, 1996 MRN: [id number]  Date of admission: 03/04/2018 Delivering MD: Tiana Loft E   Date of discharge: 03/06/2018  Admitting diagnosis: Previous Cesarean Section Intrauterine pregnancy: [redacted]w[redacted]d     Secondary diagnosis:  Principal Problem:   Cesarean delivery delivered - Repeat 5/24 Active Problems:   Postpartum care following vaginal delivery     Discharge diagnosis:  Patient Active Problem List   Diagnosis Date Noted  . Cesarean delivery delivered - Repeat 5/24 03/04/2018  . Postpartum care following vaginal delivery 03/04/2018  . Seizure-like activity ([place]) 11/30/2017  . Impaired glucose tolerance test 09/23/2017  . Chronic pelvic pain in female 02/01/2012                                                                                                 Post partum procedures: none  Complications: None  Hospital course:  Sceduled C/S   22 y.o. yo [id number] at [redacted]w[redacted]d was admitted to the hospital 03/04/2018 for scheduled cesarean section with the following indication:Elective Repeat.  Membrane Rupture Time/Date: 1:28 PM ,03/04/2018   Patient delivered a Viable infant.03/04/2018  Details of operation can be found in separate operative note.  Pateint had an uncomplicated postpartum course.  She is ambulating, tolerating a regular diet, passing flatus, and urinating well. Patient is discharged home in stable condition on  03/06/18         Physical exam  Vitals:   03/05/18 0330 03/05/18 0755 03/05/18 1230 03/06/18 0603  BP: 117/64 99/60 113/65 99/63  Pulse: 64 (!) 57 68 75  Resp: 20 18 20 18   Temp: 98.5 F (36.9 C) 97.7 F (36.5 C) 98 F (36.7 C) 98.3 F (36.8 C)  TempSrc: Oral   Oral  SpO2: 99% 96%  100%  Weight:      Height:       General: alert, cooperative and no distress Lochia: appropriate Uterine Fundus: firm Incision: Healing well with no significant drainage DVT Evaluation: No cords or calf  tenderness. No significant calf/ankle edema. Labs: Lab Results  Component Value Date   WBC 8.7 03/05/2018   HGB 10.5 (L) 03/05/2018   HCT 32.3 (L) 03/05/2018   MCV 88.7 03/05/2018   PLT 143 (L) 03/05/2018   CMP Latest Ref Rng & Units 03/03/2018  Glucose 65 - 99 mg/dL 119(H)  BUN 6 - 20 mg/dL <5(L)  Creatinine 0.44 - 1.00 mg/dL 0.44  Sodium 135 - 145 mmol/L 136  Potassium 3.5 - 5.1 mmol/L 3.5  Chloride 101 - 111 mmol/L 106  CO2 22 - 32 mmol/L 18(L)  Calcium 8.9 - 10.3 mg/dL 8.7(L)  Total Protein 6.5 - 8.1 g/dL 6.4(L)  Total Bilirubin 0.3 - 1.2 mg/dL 0.5  Alkaline Phos 38 - 126 U/L 150(H)  AST 15 - 41 U/L 21  ALT 14 - 54 U/L 11(L)    Discharge instruction: per After Visit Summary and "Baby and Me Booklet".  After visit meds:  Allergies as of 03/06/2018      Reactions   Aspirin Hives   Pt states  that she can tolerate ibuprofen   Pollen Extract Other (See Comments)   Seasonal allergy   Shellfish Allergy Nausea And Vomiting   Mold Extract [trichophyton Mentagrophyte] Itching, Rash      Medication List    STOP taking these medications   ondansetron 4 MG tablet Commonly known as:  ZOFRAN   valACYclovir 1000 MG tablet Commonly known as:  VALTREX     TAKE these medications   acetaminophen 325 MG tablet Commonly known as:  TYLENOL Take 650 mg by mouth 2 (two) times daily as needed for moderate pain.   coconut oil Oil Apply 1 application topically as needed.   hydrOXYzine 25 MG tablet Commonly known as:  ATARAX/VISTARIL Take 25 mg by mouth 2 (two) times daily as needed for anxiety (sleep).   ibuprofen 600 MG tablet Commonly known as:  ADVIL,MOTRIN Take 1 tablet (600 mg total) by mouth every 6 (six) hours.   lidocaine 3 % Crea cream Commonly known as:  LINDAMANTLE Apply 1 application topically 4 (four) times daily as needed for pain.   mupirocin ointment 2 % Commonly known as:  BACTROBAN Apply 1 application topically 3 (three) times daily.    oxyCODONE-acetaminophen 5-325 MG tablet Commonly known as:  PERCOCET/ROXICET Take 1 tablet by mouth every 4 (four) hours as needed (pain scale 4-7).   prenatal multivitamin Tabs tablet Take 1 tablet by mouth daily.   promethazine 12.5 MG tablet Commonly known as:  PHENERGAN Take 12.5 mg by mouth every 6 (six) hours as needed for nausea or vomiting.   simethicone 80 MG chewable tablet Commonly known as:  MYLICON Chew 1 tablet (80 mg total) by mouth as needed for flatulence.   STOOL SOFTENER 100 MG capsule Generic drug:  docusate sodium Take 100 mg by mouth daily.       Diet: routine diet  Activity: Advance as tolerated. Pelvic rest for 6 weeks.   Outpatient follow up:6 weeks  Follow-up Information    Charyl Bigger, MD. Schedule an appointment as soon as possible for a visit in 6 week(s).   Specialty:  Obstetrics and Gynecology Contact information: [place] [address] [phone]          Postpartum contraception: Not Discussed  Newborn Data: Live born female [name] Birth Weight: 9 lb 6.6 oz (4270 g) APGAR: 34, 9  Newborn Delivery   Birth date/time:  03/04/2018 13:30:00 Delivery type:  C-Section, Low Transverse Trial of labor:  No C-section categorization:  Repeat     Baby Feeding: Breast Disposition:home with mother   03/06/2018 Juliene Pina, CNM

## 2018-03-08 ENCOUNTER — Encounter (HOSPITAL_COMMUNITY): Payer: Self-pay | Admitting: *Deleted

## 2018-04-19 DIAGNOSIS — N898 Other specified noninflammatory disorders of vagina: Secondary | ICD-10-CM | POA: Diagnosis not present

## 2018-04-26 DIAGNOSIS — Z3202 Encounter for pregnancy test, result negative: Secondary | ICD-10-CM | POA: Diagnosis not present

## 2018-04-26 DIAGNOSIS — Z30430 Encounter for insertion of intrauterine contraceptive device: Secondary | ICD-10-CM | POA: Diagnosis not present

## 2018-05-24 DIAGNOSIS — T8332XA Displacement of intrauterine contraceptive device, initial encounter: Secondary | ICD-10-CM | POA: Diagnosis not present

## 2018-05-24 DIAGNOSIS — Z30431 Encounter for routine checking of intrauterine contraceptive device: Secondary | ICD-10-CM | POA: Diagnosis not present

## 2018-05-24 DIAGNOSIS — Z8632 Personal history of gestational diabetes: Secondary | ICD-10-CM | POA: Diagnosis not present

## 2018-06-15 DIAGNOSIS — Z30431 Encounter for routine checking of intrauterine contraceptive device: Secondary | ICD-10-CM | POA: Diagnosis not present

## 2018-09-05 DIAGNOSIS — Z118 Encounter for screening for other infectious and parasitic diseases: Secondary | ICD-10-CM | POA: Diagnosis not present

## 2018-09-05 DIAGNOSIS — Z6832 Body mass index (BMI) 32.0-32.9, adult: Secondary | ICD-10-CM | POA: Diagnosis not present

## 2018-09-05 DIAGNOSIS — Z01419 Encounter for gynecological examination (general) (routine) without abnormal findings: Secondary | ICD-10-CM | POA: Diagnosis not present

## 2018-10-06 ENCOUNTER — Other Ambulatory Visit: Payer: Self-pay

## 2018-10-06 ENCOUNTER — Encounter: Payer: Self-pay | Admitting: Emergency Medicine

## 2018-10-06 ENCOUNTER — Ambulatory Visit
Admission: EM | Admit: 2018-10-06 | Discharge: 2018-10-06 | Disposition: A | Discharge status: Home / Self Care | Payer: 59 | Attending: Family Medicine | Admitting: Family Medicine

## 2018-10-06 ENCOUNTER — Ambulatory Visit (INDEPENDENT_AMBULATORY_CARE_PROVIDER_SITE_OTHER): Payer: 59

## 2018-10-06 DIAGNOSIS — F321 Major depressive disorder, single episode, moderate: Secondary | ICD-10-CM | POA: Diagnosis not present

## 2018-10-06 DIAGNOSIS — M25562 Pain in left knee: Secondary | ICD-10-CM

## 2018-10-06 DIAGNOSIS — W1830XA Fall on same level, unspecified, initial encounter: Secondary | ICD-10-CM | POA: Diagnosis not present

## 2018-10-06 DIAGNOSIS — S8002XA Contusion of left knee, initial encounter: Secondary | ICD-10-CM | POA: Diagnosis not present

## 2018-10-06 DIAGNOSIS — S8992XA Unspecified injury of left lower leg, initial encounter: Secondary | ICD-10-CM | POA: Diagnosis not present

## 2018-10-06 MED ORDER — HYDROCODONE-ACETAMINOPHEN 5-325 MG PO TABS: ORAL_TABLET | ORAL | 0 refills | Status: DC

## 2018-10-06 NOTE — ED Triage Notes (Signed)
Patient c/o fall today hitting her left knee on a stepping stone on her walkway.

## 2018-10-06 NOTE — Discharge Instructions (Signed)
Rest, ice, elevation °

## 2018-10-06 NOTE — ED Provider Notes (Signed)
[facility]    CSN: [id number] Arrival date & time: 10/06/18  1528     History   Chief Complaint Chief Complaint  Patient presents with  . Knee Injury    HPI [name] is a 22 y.o. female.   22 yo female with a c/o left knee pain after falling on her walkway today at home. States she landed on her knee.   The history is provided by the patient.    Past Medical History:  Diagnosis Date  . Endometriosis   . Gestational diabetes    diet controlled  . Otitis media   . Seizures [facility])     Patient Active Problem List   Diagnosis Date Noted  . Cesarean delivery delivered - Repeat 5/24 03/04/2018  . Postpartum care following vaginal delivery 03/04/2018  . Seizure-like activity ([place]) 11/30/2017  . Impaired glucose tolerance test 09/23/2017  . Chronic pelvic pain in female 02/01/2012    Past Surgical History:  Procedure Laterality Date  . CESAREAN SECTION    . CESAREAN SECTION N/A 03/04/2018   Procedure: Repeat CESAREAN SECTION;  Surgeon: Charyl Bigger, MD;  Location: [place];  Service: Obstetrics;  Laterality: N/A;  EDD: 03/10/18 Allergy: Aspirin, Shellfish  . CHOLECYSTECTOMY    . LAPAROSCOPY  2012   diagnose endometriosis  . TONSILLECTOMY      OB History    Gravida  3   Para  2   Term  2   Preterm      AB  1   Living  2     SAB  1   TAB      Ectopic      Multiple  0   Live Births  2            Home Medications    Prior to Admission medications   Medication Sig Start Date End Date Taking? Authorizing Provider  acetaminophen (TYLENOL) 325 MG tablet Take 650 mg by mouth 2 (two) times daily as needed for moderate pain.     [provider]  coconut oil OIL Apply 1 application topically as needed. 03/06/18   Juliene Pina, CNM  docusate sodium (STOOL SOFTENER) 100 MG capsule Take 100 mg by mouth daily.    [provider]  HYDROcodone-acetaminophen (NORCO/VICODIN) 5-325 MG tablet 1-2 tabs  po q 8 hours prn 10/06/18   Norval Gable, MD  hydrOXYzine (ATARAX/VISTARIL) 25 MG tablet Take 25 mg by mouth 2 (two) times daily as needed for anxiety (sleep).     [provider]  ibuprofen (ADVIL,MOTRIN) 600 MG tablet Take 1 tablet (600 mg total) by mouth every 6 (six) hours. 03/06/18   Juliene Pina, CNM  lidocaine (LINDAMANTLE) 3 % CREA cream Apply 1 application topically 4 (four) times daily as needed for pain. 01/24/18   [provider]  mupirocin ointment (BACTROBAN) 2 % Apply 1 application topically 3 (three) times daily. Patient not taking: Reported on 12/24/2017 12/01/17   Melynda Ripple, MD  oxyCODONE-acetaminophen (PERCOCET/ROXICET) 5-325 MG tablet Take 1 tablet by mouth every 4 (four) hours as needed (pain scale 4-7). 03/06/18   Juliene Pina, CNM  Prenatal Vit-Fe Fumarate-FA (PRENATAL MULTIVITAMIN) TABS tablet Take 1 tablet by mouth daily.     [provider]  promethazine (PHENERGAN) 12.5 MG tablet Take 12.5 mg by mouth every 6 (six) hours as needed for nausea or vomiting.    [provider]  simethicone (MYLICON) 80 MG chewable tablet Chew  1 tablet (80 mg total) by mouth as needed for flatulence. 03/06/18   Juliene Pina, CNM    Family History Family History  Problem Relation Age of Onset  . Healthy Mother   . Diabetes Mother   . Healthy Father   . Hypertension Brother     Social History Social History   Tobacco Use  . Smoking status: Never Smoker  . Smokeless tobacco: Never Used  Substance Use Topics  . Alcohol use: No  . Drug use: No     Allergies   Aspirin; Pollen extract; Shellfish allergy; Zoloft [sertraline hcl]; and Mold extract [trichophyton mentagrophyte]   Review of Systems Review of Systems   Physical Exam Triage Vital Signs ED Triage Vitals  Enc Vitals Group     BP 10/06/18 1612 105/75     Pulse Rate 10/06/18 1612 63     Resp 10/06/18 1612 18     Temp 10/06/18 1612 99.6 F (37.6 C)     Temp Source  10/06/18 1612 Oral     SpO2 10/06/18 1612 99 %     Weight 10/06/18 1609 188 lb (85.3 kg)     Height 10/06/18 1609 5\' 4"  (1.626 m)     Head Circumference --      Peak Flow --      Pain Score 10/06/18 1609 8     Pain Loc --      Pain Edu? --      Excl. in [place]? --    No data found.  Updated Vital Signs BP 105/75 (BP Location: Left Arm)   Pulse 63   Temp 99.6 F (37.6 C) (Oral)   Resp 18   Ht 5\' 4"  (1.626 m)   Wt 85.3 kg   SpO2 99%   Breastfeeding Yes   BMI 32.27 kg/m   Visual Acuity Right Eye Distance:   Left Eye Distance:   Bilateral Distance:    Right Eye Near:   Left Eye Near:    Bilateral Near:     Physical Exam Vitals signs and nursing note reviewed.  Constitutional:      General: She is not in acute distress.    Appearance: Normal appearance. She is not toxic-appearing or diaphoretic.  Musculoskeletal:     Left knee: She exhibits swelling. She exhibits no ecchymosis, no deformity, no laceration, no erythema, normal alignment, no LCL laxity, normal patellar mobility, no bony tenderness, normal meniscus and no MCL laxity. Tenderness (anterior, diffuse) found. Lateral joint line tenderness noted.  Neurological:     Mental Status: She is alert.      UC Treatments / Results  Labs (all labs ordered are listed, but only abnormal results are displayed) Labs Reviewed - No data to display  EKG None  Radiology Dg Knee Complete 4 Views Left  Result Date: 10/06/2018 CLINICAL DATA:  Pain following fall EXAM: LEFT KNEE - COMPLETE 4+ VIEW COMPARISON:  November 14, 2007 FINDINGS: Frontal, lateral, and bilateral oblique views were obtained. There is no fracture or dislocation. No joint effusion. Joint spaces appear normal. No erosive change. IMPRESSION: No fracture or dislocation. No joint effusion. No appreciable arthropathy. Electronically Signed   By: Lowella Grip III M.D.   On: 10/06/2018 16:39    Procedures Procedures (including critical care  time)  Medications Ordered in UC Medications - No data to display  Initial Impression / Assessment and Plan / UC Course  I have reviewed the triage vital signs and the nursing notes.  Pertinent labs &  imaging results that were available during my care of the patient were reviewed by me and considered in my medical decision making (see chart for details).     Final Clinical Impressions(s) / UC Diagnoses   Final diagnoses:  Contusion of left knee, initial encounter     Discharge Instructions     Rest, ice, elevation    ED Prescriptions    Medication Sig Dispense Auth. Provider   HYDROcodone-acetaminophen (NORCO/VICODIN) 5-325 MG tablet 1-2 tabs po q 8 hours prn 8 tablet Tionne Carelli, MD      1. x-ray results and diagnosis reviewed with patient 2. rx as per orders above; reviewed possible side effects, interactions, risks and benefits  3. Recommend supportive treatment as above 4. Follow-up prn if symptoms worsen or don't improve   Controlled Substance Prescriptions [place] Controlled Substance Registry consulted? Not Applicable   Norval Gable, MD 10/06/18 [phone]

## 2018-10-07 DIAGNOSIS — M25562 Pain in left knee: Secondary | ICD-10-CM | POA: Diagnosis not present

## 2018-10-10 DIAGNOSIS — M25562 Pain in left knee: Secondary | ICD-10-CM | POA: Diagnosis not present

## 2018-10-13 DIAGNOSIS — S82025A Nondisplaced longitudinal fracture of left patella, initial encounter for closed fracture: Secondary | ICD-10-CM | POA: Diagnosis not present

## 2018-10-13 DIAGNOSIS — S82009A Unspecified fracture of unspecified patella, initial encounter for closed fracture: Secondary | ICD-10-CM | POA: Insufficient documentation

## 2018-11-03 DIAGNOSIS — S82025A Nondisplaced longitudinal fracture of left patella, initial encounter for closed fracture: Secondary | ICD-10-CM | POA: Diagnosis not present

## 2018-11-08 MED FILL — FLUoxetine HCL 20 MG CAPS: 20 | 30 days supply | Qty: 30 | Fill #0

## 2018-11-30 DIAGNOSIS — S82025A Nondisplaced longitudinal fracture of left patella, initial encounter for closed fracture: Secondary | ICD-10-CM | POA: Diagnosis not present

## 2018-12-08 DIAGNOSIS — R262 Difficulty in walking, not elsewhere classified: Secondary | ICD-10-CM | POA: Diagnosis not present

## 2018-12-08 DIAGNOSIS — M25662 Stiffness of left knee, not elsewhere classified: Secondary | ICD-10-CM | POA: Diagnosis not present

## 2018-12-08 DIAGNOSIS — M25562 Pain in left knee: Secondary | ICD-10-CM | POA: Diagnosis not present

## 2018-12-16 DIAGNOSIS — R262 Difficulty in walking, not elsewhere classified: Secondary | ICD-10-CM | POA: Diagnosis not present

## 2018-12-16 DIAGNOSIS — M25662 Stiffness of left knee, not elsewhere classified: Secondary | ICD-10-CM | POA: Diagnosis not present

## 2018-12-16 DIAGNOSIS — M25562 Pain in left knee: Secondary | ICD-10-CM | POA: Diagnosis not present

## 2018-12-20 DIAGNOSIS — M25662 Stiffness of left knee, not elsewhere classified: Secondary | ICD-10-CM | POA: Diagnosis not present

## 2018-12-20 DIAGNOSIS — M25562 Pain in left knee: Secondary | ICD-10-CM | POA: Diagnosis not present

## 2018-12-20 DIAGNOSIS — R262 Difficulty in walking, not elsewhere classified: Secondary | ICD-10-CM | POA: Diagnosis not present

## 2018-12-27 MED FILL — FLUoxetine HCL 20 MG CAPS: 20 | 30 days supply | Qty: 30 | Fill #1 | Status: TO

## 2018-12-28 DIAGNOSIS — R262 Difficulty in walking, not elsewhere classified: Secondary | ICD-10-CM | POA: Diagnosis not present

## 2018-12-28 DIAGNOSIS — M25562 Pain in left knee: Secondary | ICD-10-CM | POA: Diagnosis not present

## 2018-12-28 DIAGNOSIS — M25662 Stiffness of left knee, not elsewhere classified: Secondary | ICD-10-CM | POA: Diagnosis not present

## 2019-01-06 MED FILL — FLUoxetine HCL 20 MG CAPS: 20 | 30 days supply | Qty: 30 | Fill #0

## 2019-03-29 DIAGNOSIS — N939 Abnormal uterine and vaginal bleeding, unspecified: Secondary | ICD-10-CM | POA: Diagnosis not present

## 2019-03-29 DIAGNOSIS — R100 Acute abdomen: Secondary | ICD-10-CM | POA: Diagnosis not present

## 2019-04-17 ENCOUNTER — Other Ambulatory Visit: Payer: Self-pay

## 2019-04-17 ENCOUNTER — Ambulatory Visit
Admission: EM | Admit: 2019-04-17 | Discharge: 2019-04-17 | Disposition: A | Discharge status: Home / Self Care | Payer: 59 | Attending: Family Medicine | Admitting: Family Medicine

## 2019-04-17 DIAGNOSIS — L989 Disorder of the skin and subcutaneous tissue, unspecified: Secondary | ICD-10-CM

## 2019-04-17 MED ORDER — DOXYCYCLINE HYCLATE 100 MG PO CAPS: 100.0000 mg | ORAL_CAPSULE | Freq: Two times a day (BID) | ORAL | 0 refills | Status: DC

## 2019-04-17 NOTE — Discharge Instructions (Signed)
Antibiotic as prescribed.  Call [facility].   Take care  Dr. Lacinda Axon

## 2019-04-17 NOTE — ED Triage Notes (Signed)
Patient complains of abscess to right side of face. Patient states that she noticed this a few days ago but has worsened and is now warm to the touch.

## 2019-04-17 NOTE — ED Provider Notes (Signed)
[facility]    CSN: [id number] Arrival date & time: 04/17/19  1016  History   Chief Complaint Chief Complaint  Patient presents with  . Abscess   HPI  23 year old female presents with concern for abscess.  Patient reports that approximately 1 month ago she was scratched on the side of her face.  She states that this area has been bothering her since then.  It is located just in front of her right ear.  Patient reports that it has been getting larger.  It has been worsening over the past 3 days.  She states that it is now warm to the touch.  She states that she has tried to drain the area with a needle unsuccessfully.  Patient is concerned that she may have an abscess.  No fever.  No chills.  No other associated symptoms.  No known exacerbating factors.  No other complaints.  PMH, Surgical Hx, Family Hx, Social History reviewed and updated as below.  Past Medical History:  Diagnosis Date  . Endometriosis   . Gestational diabetes    diet controlled  . Otitis media   . Seizures [facility]     Patient Active Problem List   Diagnosis Date Noted  . Cesarean delivery delivered - Repeat 5/24 03/04/2018  . Postpartum care following vaginal delivery 03/04/2018  . Seizure-like activity ([place]) 11/30/2017  . Impaired glucose tolerance test 09/23/2017  . Chronic pelvic pain in female 02/01/2012    Past Surgical History:  Procedure Laterality Date  . CESAREAN SECTION    . CESAREAN SECTION N/A 03/04/2018   Procedure: Repeat CESAREAN SECTION;  Surgeon: Charyl Bigger, MD;  Location: [place];  Service: Obstetrics;  Laterality: N/A;  EDD: 03/10/18 Allergy: Aspirin, Shellfish  . CHOLECYSTECTOMY    . LAPAROSCOPY  2012   diagnose endometriosis  . TONSILLECTOMY      OB History    Gravida  3   Para  2   Term  2   Preterm      AB  1   Living  2     SAB  1   TAB      Ectopic      Multiple  0   Live Births  2            Home Medications    Prior  to Admission medications   Medication Sig Start Date End Date Taking? Authorizing Provider  acetaminophen (TYLENOL) 325 MG tablet Take 650 mg by mouth 2 (two) times daily as needed for moderate pain.    Yes [provider]  ibuprofen (ADVIL,MOTRIN) 600 MG tablet Take 1 tablet (600 mg total) by mouth every 6 (six) hours. 03/06/18  Yes Juliene Pina, CNM  doxycycline (VIBRAMYCIN) 100 MG capsule Take 1 capsule (100 mg total) by mouth 2 (two) times daily. 04/17/19   Coral Spikes, DO  promethazine (PHENERGAN) 12.5 MG tablet Take 12.5 mg by mouth every 6 (six) hours as needed for nausea or vomiting.  04/17/19  [provider]  simethicone (MYLICON) 80 MG chewable tablet Chew 1 tablet (80 mg total) by mouth as needed for flatulence. 03/06/18 04/17/19  Juliene Pina, CNM    Family History Family History  Problem Relation Age of Onset  . Healthy Mother   . Diabetes Mother   . Healthy Father   . Hypertension Brother     Social History Social History   Tobacco Use  . Smoking status: Never Smoker  .  Smokeless tobacco: Never Used  Substance Use Topics  . Alcohol use: No  . Drug use: No     Allergies   Aspirin, Pollen extract, Shellfish allergy, Zoloft [sertraline hcl], and Mold extract [trichophyton mentagrophyte]   Review of Systems Review of Systems  Constitutional: Negative.   Skin:       Raised area in front of R ear.   Physical Exam Triage Vital Signs ED Triage Vitals  Enc Vitals Group     BP 04/17/19 1038 125/60     Pulse Rate 04/17/19 1038 71     Resp 04/17/19 1038 16     Temp 04/17/19 1038 98.7 F (37.1 C)     Temp Source 04/17/19 1038 Oral     SpO2 04/17/19 1038 100 %     Weight 04/17/19 1036 200 lb (90.7 kg)     Height 04/17/19 1036 5\' 3"  (1.6 m)     Head Circumference --      Peak Flow --      Pain Score 04/17/19 1036 1     Pain Loc --      Pain Edu? --      Excl. in [place]? --    No data found.  Updated Vital Signs BP 125/60 (BP Location: Right  Arm)   Pulse 71   Temp 98.7 F (37.1 C) (Oral)   Resp 16   Ht 5\' 3"  (1.6 m)   Wt 90.7 kg   SpO2 100%   Breastfeeding No   BMI 35.43 kg/m   Visual Acuity Right Eye Distance:   Left Eye Distance:   Bilateral Distance:    Right Eye Near:   Left Eye Near:    Bilateral Near:     Physical Exam Vitals signs and nursing note reviewed.  Constitutional:      General: She is not in acute distress.    Appearance: Normal appearance. She is obese.  HENT:     Head: Normocephalic and atraumatic.      Comments: Raised area noted at the level location.  Mild erythema.  Abscess versus lipoma.    Nose: Nose normal.  Eyes:     General:        Right eye: No discharge.        Left eye: No discharge.     Conjunctiva/sclera: Conjunctivae normal.  Pulmonary:     Effort: Pulmonary effort is normal. No respiratory distress.  Neurological:     Mental Status: She is alert.  Psychiatric:        Mood and Affect: Mood normal.        Behavior: Behavior normal.    UC Treatments / Results  Labs (all labs ordered are listed, but only abnormal results are displayed) Labs Reviewed - No data to display  EKG   Radiology No results found.  Procedures Procedures (including critical care time)  Medications Ordered in UC Medications - No data to display  Initial Impression / Assessment and Plan / UC Course  I have reviewed the triage vital signs and the nursing notes.  Pertinent labs & imaging results that were available during my care of the patient were reviewed by me and considered in my medical decision making (see chart for details).    23 year old female presents with skin lesion.  Patient elected not to proceed with incision/exploration today.  Placing on doxycycline to cover for infection.  Advised to contact dermatology.  Recommended [facility] dermatology.  Final Clinical Impressions(s) / UC Diagnoses   Final  diagnoses:  Skin lesion     Discharge Instructions     Antibiotic as  prescribed.  Call [facility].   Take care  Dr. Lacinda Axon    ED Prescriptions    Medication Sig Dispense Auth. Provider   doxycycline (VIBRAMYCIN) 100 MG capsule Take 1 capsule (100 mg total) by mouth 2 (two) times daily. 14 capsule Coral Spikes, DO     Controlled Substance Prescriptions  Controlled Substance Registry consulted? Not Applicable   Coral Spikes, DO 04/17/19 1116

## 2019-04-30 MED FILL — FLUoxetine HCL 20 MG CAPS: 20 | 30 days supply | Qty: 30 | Fill #1

## 2019-07-14 DIAGNOSIS — L720 Epidermal cyst: Secondary | ICD-10-CM | POA: Diagnosis not present

## 2019-08-07 DIAGNOSIS — D2339 Other benign neoplasm of skin of other parts of face: Secondary | ICD-10-CM | POA: Diagnosis not present

## 2019-08-30 DIAGNOSIS — Z01419 Encounter for gynecological examination (general) (routine) without abnormal findings: Secondary | ICD-10-CM | POA: Diagnosis not present

## 2019-08-30 DIAGNOSIS — N760 Acute vaginitis: Secondary | ICD-10-CM | POA: Diagnosis not present

## 2019-08-30 DIAGNOSIS — Z6838 Body mass index (BMI) 38.0-38.9, adult: Secondary | ICD-10-CM | POA: Diagnosis not present

## 2019-08-30 MED FILL — FLUoxetine HCL 20 MG CAPS: 20 | 30 days supply | Qty: 30 | Fill #0

## 2019-09-11 DIAGNOSIS — T8332XA Displacement of intrauterine contraceptive device, initial encounter: Secondary | ICD-10-CM | POA: Diagnosis not present

## 2019-10-30 MED FILL — FLUoxetine HCL 20 MG CAPS: 20 | 90 days supply | Qty: 90 | Fill #0

## 2019-12-18 DIAGNOSIS — H5213 Myopia, bilateral: Secondary | ICD-10-CM | POA: Diagnosis not present

## 2019-12-18 DIAGNOSIS — H52223 Regular astigmatism, bilateral: Secondary | ICD-10-CM | POA: Diagnosis not present

## 2020-01-11 ENCOUNTER — Other Ambulatory Visit: Payer: Self-pay

## 2020-01-11 ENCOUNTER — Encounter: Payer: Self-pay | Admitting: Family Medicine

## 2020-01-11 ENCOUNTER — Ambulatory Visit: Payer: 59 | Admitting: Family Medicine

## 2020-01-11 VITALS — BP 102/62 | HR 70 | Ht 63.0 in | Wt 211.0 lb

## 2020-01-11 DIAGNOSIS — E663 Overweight: Secondary | ICD-10-CM | POA: Diagnosis not present

## 2020-01-11 DIAGNOSIS — Z8632 Personal history of gestational diabetes: Secondary | ICD-10-CM | POA: Diagnosis not present

## 2020-01-11 DIAGNOSIS — Z7689 Persons encountering health services in other specified circumstances: Secondary | ICD-10-CM

## 2020-01-11 NOTE — Progress Notes (Signed)
Date:  01/11/2020   Name:  [name]   DOB:  1996/02/15   MRN:  [id number]   Chief Complaint: Establish Care  Patient is a 24 year old female who presents for a establish care exam. The patient reports the following problems: none. Health maintenance has been reviewed up to date   Lab Results  Component Value Date   CREATININE 0.44 03/03/2018   BUN <5 (L) 03/03/2018   NA 136 03/03/2018   K 3.5 03/03/2018   CL 106 03/03/2018   CO2 18 (L) 03/03/2018   No results found for: CHOL, HDL, LDLCALC, LDLDIRECT, TRIG, CHOLHDL No results found for: TSH No results found for: HGBA1C Lab Results  Component Value Date   WBC 8.7 03/05/2018   HGB 10.5 (L) 03/05/2018   HCT 32.3 (L) 03/05/2018   MCV 88.7 03/05/2018   PLT 143 (L) 03/05/2018   Lab Results  Component Value Date   ALT 11 (L) 03/03/2018   AST 21 03/03/2018   ALKPHOS 150 (H) 03/03/2018   BILITOT 0.5 03/03/2018     Review of Systems  Constitutional: Negative.  Negative for chills, fatigue, fever and unexpected weight change.  HENT: Negative for congestion, ear discharge, ear pain, rhinorrhea, sinus pressure, sneezing and sore throat.   Eyes: Negative for photophobia, pain, discharge, redness and itching.  Respiratory: Negative for cough, shortness of breath, wheezing and stridor.   Gastrointestinal: Negative for abdominal pain, blood in stool, constipation, diarrhea, nausea and vomiting.  Endocrine: Negative for cold intolerance, heat intolerance, polydipsia, polyphagia and polyuria.  Genitourinary: Negative for dysuria, flank pain, frequency, hematuria, menstrual problem, pelvic pain, urgency, vaginal bleeding and vaginal discharge.  Musculoskeletal: Negative for arthralgias, back pain and myalgias.  Skin: Negative for rash.  Allergic/Immunologic: Negative for environmental allergies and food allergies.  Neurological: Negative for dizziness, weakness, light-headedness, numbness and headaches.  Hematological:  Negative for adenopathy. Does not bruise/bleed easily.  Psychiatric/Behavioral: Negative for dysphoric mood. The patient is not nervous/anxious.     Patient Active Problem List   Diagnosis Date Noted  . Cesarean delivery delivered - Repeat 5/24 03/04/2018  . Postpartum care following vaginal delivery 03/04/2018  . Seizure-like activity ([place]) 11/30/2017  . Impaired glucose tolerance test 09/23/2017  . Chronic pelvic pain in female 02/01/2012    Allergies  Allergen Reactions  . Aspirin Hives    Pt states that she can tolerate ibuprofen  . Pollen Extract Other (See Comments)    Seasonal allergy  . Shellfish Allergy Nausea And Vomiting  . Zoloft [Sertraline Hcl]   . Mold Extract [Trichophyton Mentagrophyte] Itching and Rash    Past Surgical History:  Procedure Laterality Date  . CESAREAN SECTION    . CESAREAN SECTION N/A 03/04/2018   Procedure: Repeat CESAREAN SECTION;  Surgeon: Charyl Bigger, MD;  Location: [place];  Service: Obstetrics;  Laterality: N/A;  EDD: 03/10/18 Allergy: Aspirin, Shellfish  . CHOLECYSTECTOMY    . LAPAROSCOPY  2012   diagnose endometriosis  . TONSILLECTOMY      Social History   Tobacco Use  . Smoking status: Never Smoker  . Smokeless tobacco: Never Used  Substance Use Topics  . Alcohol use: Yes    Alcohol/week: 2.0 standard drinks    Types: 2 Glasses of wine per week  . Drug use: No     Medication list has been reviewed and updated.  No outpatient medications have been marked as taking for the 01/11/20 encounter (Office Visit) with Juline Patch,  MD.    PHQ 2/9 Scores 01/11/2020 09/23/2017  PHQ - 2 Score 0 0  PHQ- 9 Score 1 -    BP Readings from Last 3 Encounters:  01/11/20 102/62  04/17/19 125/60  10/06/18 105/75    Physical Exam Vitals and nursing note reviewed.  Constitutional:      Appearance: She is well-developed.  HENT:     Head: Normocephalic.     Right Ear: Tympanic membrane, ear canal and external ear  normal.     Left Ear: Tympanic membrane, ear canal and external ear normal.     Nose: Nose normal.     Mouth/Throat:     Mouth: Mucous membranes are moist.  Eyes:     General: Lids are everted, no foreign bodies appreciated. No scleral icterus.       Left eye: No foreign body or hordeolum.     Conjunctiva/sclera: Conjunctivae normal.     Right eye: Right conjunctiva is not injected.     Left eye: Left conjunctiva is not injected.     Pupils: Pupils are equal, round, and reactive to light.  Neck:     Thyroid: No thyromegaly.     Vascular: No carotid bruit or JVD.     Trachea: No tracheal deviation.  Cardiovascular:     Rate and Rhythm: Normal rate and regular rhythm.     Pulses: Normal pulses.     Heart sounds: Normal heart sounds. No murmur. No friction rub. No gallop.   Pulmonary:     Effort: Pulmonary effort is normal. No respiratory distress.     Breath sounds: Normal breath sounds. No stridor. No wheezing, rhonchi or rales.  Abdominal:     General: Bowel sounds are normal. There is no distension.     Palpations: Abdomen is soft. There is no mass.     Tenderness: There is no abdominal tenderness. There is no guarding or rebound.     Hernia: No hernia is present.  Musculoskeletal:        General: No tenderness. Normal range of motion.     Cervical back: Normal range of motion and neck supple. No rigidity or tenderness.  Lymphadenopathy:     Cervical: No cervical adenopathy.  Skin:    General: Skin is warm.     Findings: No rash.  Neurological:     Mental Status: She is alert and oriented to person, place, and time.     Cranial Nerves: No cranial nerve deficit.     Deep Tendon Reflexes: Reflexes normal.  Psychiatric:        Mood and Affect: Mood is not anxious or depressed.     Wt Readings from Last 3 Encounters:  01/11/20 211 lb (95.7 kg)  04/17/19 200 lb (90.7 kg)  10/06/18 188 lb (85.3 kg)    BP 102/62   Pulse 70   Ht 5\' 3"  (1.6 m)   Wt 211 lb (95.7 kg)    LMP 12/28/2019 (Exact Date)   BMI 37.38 kg/m   Assessment and Plan: 1. Establishing care with new doctor, encounter for Stable treatment of care with new physician.  Patient has no medical concerns at this time patient will have need of a physical exam as employee of [employer] and we will go ahead and complete exam with preparation to sign papers and to obtain labs later in the week.  2. History of gestational diabetes Patient with history of gestational diabetes however there has not been any symptoms of persistence of  this since delivery.  However we will obtain a fasting glucose and if elevated can discuss letter A1c to rule out prediabetes. - Renal function panel - Lipid Panel With LDL/HDL Ratio  3. Overweight Patient is overweight and is doing well to lose weight and has had a plateau and we have discussed patient going on 1000-calorie diet to further continue with the dietary goal.

## 2020-01-16 DIAGNOSIS — Z8632 Personal history of gestational diabetes: Secondary | ICD-10-CM | POA: Diagnosis not present

## 2020-01-17 LAB — LIPID PANEL WITH LDL/HDL RATIO
Cholesterol, Total: 156 mg/dL (ref 100–199)
HDL: 40 mg/dL (ref >39)
LDL Chol Calc (NIH): 105 mg/dL — ABNORMAL HIGH (ref 0–99)
LDL/HDL Ratio: 2.6 ratio (ref 0.0–3.2)
Triglycerides: 56 mg/dL (ref 0–149)
VLDL Cholesterol Cal: 11 mg/dL (ref 5–40)

## 2020-01-17 LAB — RENAL FUNCTION PANEL
Albumin: 3.7 g/dL — ABNORMAL LOW (ref 3.9–5.0)
BUN/Creatinine Ratio: 10 (ref 9–23)
BUN: 6 mg/dL (ref 6–20)
CO2: 22 mmol/L (ref 20–29)
Calcium: 8.5 mg/dL — ABNORMAL LOW (ref 8.7–10.2)
Chloride: 106 mmol/L (ref 96–106)
Creatinine, Ser: 0.61 mg/dL (ref 0.57–1.00)
GFR calc Af Amer: 146 mL/min/{1.73_m2} (ref >59)
GFR calc non Af Amer: 126 mL/min/{1.73_m2} (ref >59)
Glucose: 106 mg/dL — ABNORMAL HIGH (ref 65–99)
Phosphorus: 3.7 mg/dL (ref 3.0–4.3)
Potassium: 4 mmol/L (ref 3.5–5.2)
Sodium: 143 mmol/L (ref 134–144)

## 2020-04-11 MED FILL — FLUoxetine HCL 20 MG CAPS: 20 | 90 days supply | Qty: 90 | Fill #1

## 2020-05-01 ENCOUNTER — Ambulatory Visit
Admission: RE | Admit: 2020-05-01 | Discharge: 2020-05-01 | Disposition: A | Discharge status: Home / Self Care | Payer: 59 | Source: Ambulatory Visit | Attending: Emergency Medicine | Admitting: Emergency Medicine

## 2020-05-01 ENCOUNTER — Other Ambulatory Visit: Payer: Self-pay

## 2020-05-01 VITALS — BP 112/77 | HR 101 | Temp 99.1°F | Resp 16

## 2020-05-01 DIAGNOSIS — R112 Nausea with vomiting, unspecified: Secondary | ICD-10-CM

## 2020-05-01 DIAGNOSIS — R197 Diarrhea, unspecified: Secondary | ICD-10-CM

## 2020-05-01 DIAGNOSIS — Z7689 Persons encountering health services in other specified circumstances: Secondary | ICD-10-CM | POA: Diagnosis not present

## 2020-05-01 DIAGNOSIS — R079 Chest pain, unspecified: Secondary | ICD-10-CM

## 2020-05-01 LAB — POCT URINALYSIS DIP (MANUAL ENTRY)
Glucose, UA: NEGATIVE mg/dL
Leukocytes, UA: NEGATIVE
Nitrite, UA: NEGATIVE
Protein Ur, POC: NEGATIVE mg/dL
Spec Grav, UA: 1.02 (ref 1.010–1.025)
Urobilinogen, UA: 1 E.U./dL
pH, UA: 5.5 (ref 5.0–8.0)

## 2020-05-01 LAB — POCT URINE PREGNANCY: Preg Test, Ur: NEGATIVE

## 2020-05-01 LAB — POC SARS CORONAVIRUS 2 AG -  ED: SARS Coronavirus 2 Ag: NEGATIVE

## 2020-05-01 MED ORDER — ONDANSETRON HCL 4 MG PO TABS: 4.0000 mg | ORAL_TABLET | Freq: Four times a day (QID) | ORAL | 0 refills | Status: DC | PRN

## 2020-05-01 MED ORDER — ONDANSETRON 4 MG PO TBDP: 4.0000 mg | ORAL_TABLET | Freq: Once | ORAL | Status: DC

## 2020-05-01 MED ORDER — ONDANSETRON HCL 4 MG/2ML IJ SOLN
4.0000 mg | Freq: Once | INTRAMUSCULAR | Status: AC
  Administered 2020-05-01: 4 mg via INTRAVENOUS

## 2020-05-01 MED ORDER — ALUM & MAG HYDROXIDE-SIMETH 200-200-20 MG/5ML PO SUSP
30.0000 mL | Freq: Once | ORAL | Status: AC
  Administered 2020-05-01: 30 mL via ORAL

## 2020-05-01 MED ORDER — ONDANSETRON HCL 4 MG/2ML IJ SOLN
4.0000 mg | Freq: Once | INTRAMUSCULAR | Status: AC
  Administered 2020-05-01: 4 mg via INTRAMUSCULAR

## 2020-05-01 MED ORDER — LIDOCAINE VISCOUS HCL 2 % MT SOLN
15.0000 mL | Freq: Once | OROMUCOSAL | Status: AC
  Administered 2020-05-01: 15 mL via ORAL

## 2020-05-01 MED ORDER — SODIUM CHLORIDE 0.9 % IV SOLN: Freq: Once | INTRAVENOUS | Status: AC

## 2020-05-01 MED ORDER — ONDANSETRON HCL 4 MG/2ML IJ SOLN: 4.0000 mg | Freq: Once | INTRAMUSCULAR | Status: DC

## 2020-05-01 NOTE — ED Notes (Signed)
Patient reports symptom relief after IV zofran. Provider aware.

## 2020-05-01 NOTE — Discharge Instructions (Addendum)
Take the antinausea medication as directed.    Keep yourself hydrated with clear liquids, such as water, Gatorade, Pedialyte, Sprite, or ginger ale.    Go to the emergency department if you have acute worsening symptoms.    Follow up with your primary care provider if your symptoms are not improving.      

## 2020-05-01 NOTE — ED Provider Notes (Signed)
[name]    CSN: [id number] Arrival date & time: 05/01/20  5638      History   Chief Complaint Chief Complaint  Patient presents with  . Chest Pain  . Emesis  . Diarrhea    HPI [name] is a 24 y.o. female.   Patient presents with sudden onset of midsternal chest pain, chills, nausea at approximately 6 PM yesterday after eating dinner.  She states she then vomited and felt better but has had vomiting and diarrhea through the night.  She also reports lower abdominal cramping and discomfort.  She denies fever, cough, shortness of breath, rash, or other symptoms.  No treatment attempted at home.  The history is provided by the patient.    Past Medical History:  Diagnosis Date  . Endometriosis   . Gestational diabetes    diet controlled  . Otitis media   . Seizures [facility])     Patient Active Problem List   Diagnosis Date Noted  . Cesarean delivery delivered - Repeat 5/24 03/04/2018  . Postpartum care following vaginal delivery 03/04/2018  . Seizure-like activity ([place]) 11/30/2017  . Impaired glucose tolerance test 09/23/2017  . Chronic pelvic pain in female 02/01/2012    Past Surgical History:  Procedure Laterality Date  . CESAREAN SECTION    . CESAREAN SECTION N/A 03/04/2018   Procedure: Repeat CESAREAN SECTION;  Surgeon: Charyl Bigger, MD;  Location: [place];  Service: Obstetrics;  Laterality: N/A;  EDD: 03/10/18 Allergy: Aspirin, Shellfish  . CHOLECYSTECTOMY    . LAPAROSCOPY  2012   diagnose endometriosis  . TONSILLECTOMY      OB History    Gravida  3   Para  2   Term  2   Preterm      AB  1   Living  2     SAB  1   TAB      Ectopic      Multiple  0   Live Births  2            Home Medications    Prior to Admission medications   Medication Sig Start Date End Date Taking? Authorizing Provider  acetaminophen (TYLENOL) 325 MG tablet Take 650 mg by mouth 2 (two) times daily as needed for moderate pain.      [provider]  doxycycline (VIBRAMYCIN) 100 MG capsule Take 1 capsule (100 mg total) by mouth 2 (two) times daily. Patient not taking: Reported on 01/11/2020 04/17/19   Coral Spikes, DO  ibuprofen (ADVIL,MOTRIN) 600 MG tablet Take 1 tablet (600 mg total) by mouth every 6 (six) hours. Patient not taking: Reported on 01/11/2020 03/06/18   Juliene Pina, CNM  ondansetron (ZOFRAN) 4 MG tablet Take 1 tablet (4 mg total) by mouth every 6 (six) hours as needed for nausea or vomiting. 05/01/20   Sharion Balloon, NP  promethazine (PHENERGAN) 12.5 MG tablet Take 12.5 mg by mouth every 6 (six) hours as needed for nausea or vomiting.  04/17/19  [provider]  simethicone (MYLICON) 80 MG chewable tablet Chew 1 tablet (80 mg total) by mouth as needed for flatulence. 03/06/18 04/17/19  Juliene Pina, CNM    Family History Family History  Problem Relation Age of Onset  . Healthy Mother   . Diabetes Mother   . Hearing loss Mother   . Healthy Father     Social History Social History   Tobacco Use  . Smoking status:  Never Smoker  . Smokeless tobacco: Never Used  Vaping Use  . Vaping Use: Never used  Substance Use Topics  . Alcohol use: Yes    Alcohol/week: 2.0 standard drinks    Types: 2 Glasses of wine per week  . Drug use: No     Allergies   Aspirin, Pollen extract, Shellfish allergy, Zoloft [sertraline hcl], and Mold extract [trichophyton mentagrophyte]   Review of Systems Review of Systems  Constitutional: Negative for chills and fever.  HENT: Negative for ear pain and sore throat.   Eyes: Negative for pain and visual disturbance.  Respiratory: Negative for cough and shortness of breath.   Cardiovascular: Positive for chest pain. Negative for palpitations.  Gastrointestinal: Positive for diarrhea, nausea and vomiting. Negative for abdominal pain.  Genitourinary: Negative for dysuria and hematuria.  Musculoskeletal: Negative for arthralgias and back pain.  Skin:  Negative for color change and rash.  Neurological: Negative for seizures and syncope.  All other systems reviewed and are negative.    Physical Exam Triage Vital Signs ED Triage Vitals [05/01/20 0906]  Enc Vitals Group     BP      Pulse      Resp      Temp      Temp src      SpO2      Weight      Height      Head Circumference      Peak Flow      Pain Score 8     Pain Loc      Pain Edu?      Excl. in [place]?    No data found.  Updated Vital Signs BP 112/77   Pulse (!) 101   Temp 99.1 F (37.3 C)   Resp 16   SpO2 96%   Visual Acuity Right Eye Distance:   Left Eye Distance:   Bilateral Distance:    Right Eye Near:   Left Eye Near:    Bilateral Near:     Physical Exam Vitals and nursing note reviewed.  Constitutional:      General: She is not in acute distress.    Appearance: She is well-developed. She is not ill-appearing.  HENT:     Head: Normocephalic and atraumatic.     Mouth/Throat:     Mouth: Mucous membranes are moist.     Pharynx: Oropharynx is clear.  Eyes:     Conjunctiva/sclera: Conjunctivae normal.  Cardiovascular:     Rate and Rhythm: Normal rate and regular rhythm.     Heart sounds: Normal heart sounds. No murmur heard.   Pulmonary:     Effort: Pulmonary effort is normal. No respiratory distress.     Breath sounds: Normal breath sounds.  Abdominal:     General: Bowel sounds are normal.     Palpations: Abdomen is soft.     Tenderness: There is abdominal tenderness in the right lower quadrant and left lower quadrant. There is no right CVA tenderness, left CVA tenderness, guarding or rebound.     Comments: Mildly tender to palpation of lower abdomen.   Musculoskeletal:     Cervical back: Neck supple.     Right lower leg: No edema.     Left lower leg: No edema.  Skin:    General: Skin is warm and dry.     Findings: No rash.  Neurological:     Mental Status: She is alert.     Gait: Gait normal.  Psychiatric:  Mood and Affect: Mood  normal.        Behavior: Behavior normal.      UC Treatments / Results  Labs (all labs ordered are listed, but only abnormal results are displayed) Labs Reviewed  POCT URINALYSIS DIP (MANUAL ENTRY) - Abnormal; Notable for the following components:      Result Value   Color, UA straw (*)    Bilirubin, UA small (*)    Ketones, POC UA large (80) (*)    Blood, UA trace-intact (*)    All other components within normal limits  NOVEL CORONAVIRUS, NAA  CBC  COMPREHENSIVE METABOLIC PANEL  POC SARS CORONAVIRUS 2 AG -  ED  POCT URINE PREGNANCY    EKG   Radiology No results found.  Procedures Procedures (including critical care time)  Medications Ordered in UC Medications  ondansetron (ZOFRAN-ODT) disintegrating tablet 4 mg (4 mg Oral Not Given 05/01/20 1003)  ondansetron (ZOFRAN) injection 4 mg (has no administration in time range)  alum & mag hydroxide-simeth (MAALOX/MYLANTA) 200-200-20 MG/5ML suspension 30 mL (30 mLs Oral Given 05/01/20 1016)    And  lidocaine (XYLOCAINE) 2 % viscous mouth solution 15 mL (15 mLs Oral Given 05/01/20 1016)  ondansetron (ZOFRAN) injection 4 mg (4 mg Intravenous Given 05/01/20 1005)  0.9 %  sodium chloride infusion ( Intravenous New Bag/Given 05/01/20 1016)    Initial Impression / Assessment and Plan / UC Course  I have reviewed the triage vital signs and the nursing notes.  Pertinent labs & imaging results that were available during my care of the patient were reviewed by me and considered in my medical decision making (see chart for details).   Nausea, vomiting, diarrhea. Chest pain.  Patient improved significantly with GI cocktail, Zofran, IV NS x 1 liter.  Able to take po fluids with vomiting.  Chest pain resolved with GI cocktail.  EKG shows sinus rhythm, rate 89, no ST elevation, compared to previous from 2014.  CBC, CMP pending.  Urine pregnancy negative.  POC COVID negative; PCR pending. Discharging with prescription for Zofran.  Discussed  hydration with clear liquids, advance to [employer] as tolerated.  Strict instructions for ED if worsening symptoms.  Otherwise, follow up with PCP if not improving.        Final Clinical Impressions(s) / UC Diagnoses   Final diagnoses:  Non-intractable vomiting with nausea, unspecified vomiting type  Diarrhea, unspecified type  Chest pain, unspecified type     Discharge Instructions     Take the antinausea medication as directed.    Keep yourself hydrated with clear liquids, such as water, Gatorade, Pedialyte, Sprite, or ginger ale.    Go to the emergency department if you have acute worsening symptoms.    Follow up with your primary care provider if your symptoms are not improving.         ED Prescriptions    Medication Sig Dispense Auth. Provider   ondansetron (ZOFRAN) 4 MG tablet Take 1 tablet (4 mg total) by mouth every 6 (six) hours as needed for nausea or vomiting. 12 tablet Sharion Balloon, NP     PDMP not reviewed this encounter.   Sharion Balloon, NP 05/01/20 1116

## 2020-05-01 NOTE — ED Triage Notes (Signed)
Patient reports sudden onset of chest pain last night after dinner followed by vomiting and diarrhea. Reports symptom relief after vomiting.   Denies: sore throat, cough  OTC: none

## 2020-05-02 ENCOUNTER — Telehealth: Payer: Self-pay | Admitting: Emergency Medicine

## 2020-05-02 LAB — COMPREHENSIVE METABOLIC PANEL
ALT: 13 IU/L (ref 0–32)
AST: 14 IU/L (ref 0–40)
Albumin/Globulin Ratio: 1.8 (ref 1.2–2.2)
Albumin: 4.4 g/dL (ref 3.9–5.0)
Alkaline Phosphatase: 78 IU/L (ref 48–121)
BUN/Creatinine Ratio: 17 (ref 9–23)
BUN: 10 mg/dL (ref 6–20)
Bilirubin Total: 0.6 mg/dL (ref 0.0–1.2)
CO2: 19 mmol/L — ABNORMAL LOW (ref 20–29)
Calcium: 9.2 mg/dL (ref 8.7–10.2)
Chloride: 103 mmol/L (ref 96–106)
Creatinine, Ser: 0.58 mg/dL (ref 0.57–1.00)
GFR calc Af Amer: 148 mL/min/{1.73_m2} (ref >59)
GFR calc non Af Amer: 129 mL/min/{1.73_m2} (ref >59)
Globulin, Total: 2.5 g/dL (ref 1.5–4.5)
Glucose: 103 mg/dL — ABNORMAL HIGH (ref 65–99)
Potassium: 4.3 mmol/L (ref 3.5–5.2)
Sodium: 139 mmol/L (ref 134–144)
Total Protein: 6.9 g/dL (ref 6.0–8.5)

## 2020-05-02 LAB — CBC
Hematocrit: 45.5 % (ref 34.0–46.6)
Hemoglobin: 15.7 g/dL (ref 11.1–15.9)
MCH: 31.3 pg (ref 26.6–33.0)
MCHC: 34.5 g/dL (ref 31.5–35.7)
MCV: 91 fL (ref 79–97)
Platelets: 183 10*3/uL (ref 150–450)
RBC: 5.02 x10E6/uL (ref 3.77–5.28)
RDW: 12 % (ref 11.7–15.4)
WBC: 6.8 10*3/uL (ref 3.4–10.8)

## 2020-05-02 LAB — NOVEL CORONAVIRUS, NAA: SARS-CoV-2, NAA: NOT DETECTED

## 2020-05-02 LAB — SARS-COV-2, NAA 2 DAY TAT

## 2020-05-02 NOTE — Telephone Encounter (Signed)
Reviewed lab results with patient via telephone.  Patient states she is feeling "much better" today.  Instructed her to follow up with her PCP as needed.

## 2020-05-02 NOTE — Telephone Encounter (Signed)
Left message for patient to call back to discuss lab results. 

## 2020-07-08 MED FILL — FLUoxetine HCL 20 MG CAPS: 20 | 90 days supply | Qty: 90 | Fill #2

## 2020-07-12 ENCOUNTER — Encounter: Payer: Self-pay | Admitting: Family Medicine

## 2020-07-12 ENCOUNTER — Other Ambulatory Visit: Payer: Self-pay

## 2020-07-12 ENCOUNTER — Ambulatory Visit (INDEPENDENT_AMBULATORY_CARE_PROVIDER_SITE_OTHER): Payer: 59 | Admitting: Family Medicine

## 2020-07-12 VITALS — BP 120/60 | HR 76 | Temp 98.8°F | Resp 12 | Ht 63.0 in | Wt 183.0 lb

## 2020-07-12 DIAGNOSIS — Z020 Encounter for examination for admission to educational institution: Secondary | ICD-10-CM | POA: Diagnosis not present

## 2020-07-12 NOTE — Progress Notes (Signed)
Date:  07/12/2020   Name:  [name]   DOB:  1996/08/30   MRN:  [id number]   Chief Complaint: Annual Exam  Patient is a 24 year old female who presents for a establish care exam. The patient reports the following problems: none. Health maintenance has been reviewed up to date.   Lab Results  Component Value Date   CREATININE 0.58 05/01/2020   BUN 10 05/01/2020   NA 139 05/01/2020   K 4.3 05/01/2020   CL 103 05/01/2020   CO2 19 (L) 05/01/2020   Lab Results  Component Value Date   CHOL 156 01/16/2020   HDL 40 01/16/2020   LDLCALC 105 (H) 01/16/2020   TRIG 56 01/16/2020   No results found for: TSH No results found for: HGBA1C Lab Results  Component Value Date   WBC 6.8 05/01/2020   HGB 15.7 05/01/2020   HCT 45.5 05/01/2020   MCV 91 05/01/2020   PLT 183 05/01/2020   Lab Results  Component Value Date   ALT 13 05/01/2020   AST 14 05/01/2020   ALKPHOS 78 05/01/2020   BILITOT 0.6 05/01/2020     Review of Systems  Constitutional: Negative.  Negative for chills, fatigue, fever and unexpected weight change.  HENT: Negative for congestion, ear discharge, ear pain, rhinorrhea, sinus pressure, sneezing and sore throat.   Eyes: Negative for photophobia, pain, discharge, redness and itching.  Respiratory: Negative for cough, shortness of breath, wheezing and stridor.   Gastrointestinal: Negative for abdominal pain, blood in stool, constipation, diarrhea, nausea and vomiting.  Endocrine: Negative for cold intolerance, heat intolerance, polydipsia, polyphagia and polyuria.  Genitourinary: Negative for dysuria, flank pain, frequency, hematuria, menstrual problem, pelvic pain, urgency, vaginal bleeding and vaginal discharge.  Musculoskeletal: Negative for arthralgias, back pain and myalgias.  Skin: Negative for rash.  Allergic/Immunologic: Negative for environmental allergies and food allergies.  Neurological: Negative for dizziness, weakness, light-headedness,  numbness and headaches.  Hematological: Negative for adenopathy. Does not bruise/bleed easily.  Psychiatric/Behavioral: Negative for dysphoric mood. The patient is not nervous/anxious.     Patient Active Problem List   Diagnosis Date Noted  . Cesarean delivery delivered - Repeat 5/24 03/04/2018  . Postpartum care following vaginal delivery 03/04/2018  . Seizure-like activity ([place]) 11/30/2017  . Impaired glucose tolerance test 09/23/2017  . Chronic pelvic pain in female 02/01/2012    Allergies  Allergen Reactions  . Aspirin Hives    Pt states that she can tolerate ibuprofen  . Pollen Extract Other (See Comments)    Seasonal allergy  . Shellfish Allergy Nausea And Vomiting  . Zoloft [Sertraline Hcl]   . Mold Extract [Trichophyton Mentagrophyte] Itching and Rash    Past Surgical History:  Procedure Laterality Date  . CESAREAN SECTION    . CESAREAN SECTION N/A 03/04/2018   Procedure: Repeat CESAREAN SECTION;  Surgeon: Charyl Bigger, MD;  Location: [place];  Service: Obstetrics;  Laterality: N/A;  EDD: 03/10/18 Allergy: Aspirin, Shellfish  . CHOLECYSTECTOMY    . LAPAROSCOPY  2012   diagnose endometriosis  . TONSILLECTOMY      Social History   Tobacco Use  . Smoking status: Never Smoker  . Smokeless tobacco: Never Used  Vaping Use  . Vaping Use: Never used  Substance Use Topics  . Alcohol use: Yes    Alcohol/week: 2.0 standard drinks    Types: 2 Glasses of wine per week  . Drug use: No     Medication list has been  reviewed and updated.  No outpatient medications have been marked as taking for the 07/12/20 encounter (Office Visit) with Juline Patch, MD.    [facility] 2/9 Scores 01/11/2020 09/23/2017  PHQ - 2 Score 0 0  PHQ- 9 Score 1 -    GAD 7 : Generalized Anxiety Score 01/11/2020  Nervous, Anxious, on Edge 0  Control/stop worrying 0  Worry too much - different things 0  Trouble relaxing 0  Restless 0  Easily annoyed or irritable 0  Afraid - awful  might happen 0  Total GAD 7 Score 0  Anxiety Difficulty Not difficult at all    BP Readings from Last 3 Encounters:  07/12/20 120/60  05/01/20 112/77  01/11/20 102/62    Physical Exam Vitals and nursing note reviewed.  Constitutional:      Appearance: She is well-developed.  HENT:     Head: Normocephalic.     Right Ear: Tympanic membrane, ear canal and external ear normal. There is no impacted cerumen.     Left Ear: Tympanic membrane, ear canal and external ear normal. There is no impacted cerumen.     Nose: Nose normal. No congestion or rhinorrhea.  Eyes:     General: Lids are everted, no foreign bodies appreciated. No scleral icterus.       Left eye: No foreign body or hordeolum.     Conjunctiva/sclera: Conjunctivae normal.     Right eye: Right conjunctiva is not injected.     Left eye: Left conjunctiva is not injected.     Pupils: Pupils are equal, round, and reactive to light.  Neck:     Thyroid: No thyromegaly.     Vascular: No JVD.     Trachea: No tracheal deviation.  Cardiovascular:     Rate and Rhythm: Normal rate and regular rhythm.     Heart sounds: Normal heart sounds. No murmur heard.  No friction rub. No gallop.   Pulmonary:     Effort: Pulmonary effort is normal. No respiratory distress.     Breath sounds: Normal breath sounds. No wheezing, rhonchi or rales.  Abdominal:     General: Bowel sounds are normal.     Palpations: Abdomen is soft. There is no mass.     Tenderness: There is no abdominal tenderness. There is no guarding or rebound.  Musculoskeletal:        General: No tenderness. Normal range of motion.     Cervical back: Normal range of motion and neck supple.  Lymphadenopathy:     Cervical: No cervical adenopathy.  Skin:    General: Skin is warm.     Capillary Refill: Capillary refill takes less than 2 seconds.     Findings: No rash.  Neurological:     Mental Status: She is alert and oriented to person, place, and time.     Cranial Nerves:  No cranial nerve deficit.     Deep Tendon Reflexes: Reflexes normal.  Psychiatric:        Mood and Affect: Mood is not anxious or depressed.     Wt Readings from Last 3 Encounters:  07/12/20 183 lb (83 kg)  01/11/20 211 lb (95.7 kg)  04/17/19 200 lb (90.7 kg)    BP 120/60   Pulse 76   Temp 98.8 F (37.1 C) (Oral)   Resp 12   Ht 5\' 3"  (1.6 m)   Wt 183 lb (83 kg)   BMI 32.42 kg/m   Assessment and Plan: 1. 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  Patient's previous encounters were reviewed along with most recent labs most recent imaging and care everywhere.  We are obtaining varicella-zoster titers as well as CBC as directed by the form.  Exam was performed with no contraindications for attending nursing school.  We are waiting the aforementioned lab work to complete paperwork.- Varicella Zoster Abs, IgG/IgM - CBC with Differential/Platelet

## 2020-07-16 ENCOUNTER — Ambulatory Visit (INDEPENDENT_AMBULATORY_CARE_PROVIDER_SITE_OTHER): Payer: 59

## 2020-07-16 ENCOUNTER — Other Ambulatory Visit: Payer: Self-pay

## 2020-07-16 DIAGNOSIS — Z111 Encounter for screening for respiratory tuberculosis: Secondary | ICD-10-CM

## 2020-07-16 LAB — CBC WITH DIFFERENTIAL/PLATELET
Basophils Absolute: 0 10*3/uL (ref 0.0–0.2)
Basos: 1 %
EOS (ABSOLUTE): 0.2 10*3/uL (ref 0.0–0.4)
Eos: 3 %
Hematocrit: 41.6 % (ref 34.0–46.6)
Hemoglobin: 14.4 g/dL (ref 11.1–15.9)
Immature Grans (Abs): 0 10*3/uL (ref 0.0–0.1)
Immature Granulocytes: 0 %
Lymphocytes Absolute: 1.7 10*3/uL (ref 0.7–3.1)
Lymphs: 31 %
MCH: 31.4 pg (ref 26.6–33.0)
MCHC: 34.6 g/dL (ref 31.5–35.7)
MCV: 91 fL (ref 79–97)
Monocytes Absolute: 0.5 10*3/uL (ref 0.1–0.9)
Monocytes: 8 %
Neutrophils Absolute: 3.1 10*3/uL (ref 1.4–7.0)
Neutrophils: 57 %
Platelets: 201 10*3/uL (ref 150–450)
RBC: 4.58 x10E6/uL (ref 3.77–5.28)
RDW: 12.3 % (ref 11.7–15.4)
WBC: 5.5 10*3/uL (ref 3.4–10.8)

## 2020-07-16 LAB — VARICELLA ZOSTER ABS, IGG/IGM
Varicella IgM: 1.82 index — ABNORMAL HIGH (ref 0.00–0.90)
Varicella zoster IgG: 2404 index (ref >165)

## 2020-07-18 LAB — TB SKIN TEST
Induration: 0 mm
TB Skin Test: NEGATIVE

## 2020-07-30 ENCOUNTER — Other Ambulatory Visit: Payer: Self-pay

## 2020-07-30 ENCOUNTER — Ambulatory Visit (INDEPENDENT_AMBULATORY_CARE_PROVIDER_SITE_OTHER): Payer: 59

## 2020-07-30 DIAGNOSIS — Z111 Encounter for screening for respiratory tuberculosis: Secondary | ICD-10-CM

## 2020-08-01 LAB — TB SKIN TEST
Induration: 0 mm
TB Skin Test: NEGATIVE

## 2020-11-22 ENCOUNTER — Encounter: Payer: Self-pay | Admitting: Family Medicine

## 2020-11-22 ENCOUNTER — Other Ambulatory Visit: Payer: Self-pay

## 2020-11-22 ENCOUNTER — Ambulatory Visit: Payer: 59 | Admitting: Family Medicine

## 2020-11-22 VITALS — BP 120/80 | HR 80 | Ht 63.0 in | Wt 189.0 lb

## 2020-11-22 DIAGNOSIS — S46312A Strain of muscle, fascia and tendon of triceps, left arm, initial encounter: Secondary | ICD-10-CM | POA: Diagnosis not present

## 2020-11-22 DIAGNOSIS — F4323 Adjustment disorder with mixed anxiety and depressed mood: Secondary | ICD-10-CM | POA: Diagnosis not present

## 2020-11-22 DIAGNOSIS — F900 Attention-deficit hyperactivity disorder, predominantly inattentive type: Secondary | ICD-10-CM

## 2020-11-22 MED ORDER — MELOXICAM 7.5 MG PO TABS: 7.5000 mg | ORAL_TABLET | Freq: Every day | ORAL | 0 refills | Status: DC

## 2020-11-22 MED ORDER — FLUOXETINE HCL 10 MG PO CAPS: 10.0000 mg | ORAL_CAPSULE | Freq: Every day | ORAL | 0 refills | Status: DC

## 2020-11-22 NOTE — Progress Notes (Signed)
Date:  11/22/2020   Name:  [name]   DOB:  [birth year]   MRN:  [id number]   Chief Complaint: Depression (Use to be on prozac 20mg - would like to go back on- 18 and 15) and Extremity Weakness (L) arm tingling s/p fall- hit back of upper arm in fall)  Depression        This is a recurrent problem.  The current episode started more than 1 month ago.   The onset quality is gradual.   The problem occurs constantly.  The problem has been gradually worsening since onset.  Associated symptoms include helplessness, hopelessness, insomnia, irritable, restlessness, decreased interest and sad.  Associated symptoms include no decreased concentration, no fatigue, no appetite change, no body aches, no myalgias, no headaches, no indigestion and no suicidal ideas.     The symptoms are aggravated by work stress and family issues.  Treatments tried: use to be on prozac.  Compliance with treatment is good. Arm Pain  The incident occurred more than 1 week ago. The incident occurred at home. The injury mechanism was a fall. Pain location: left tricep. The quality of the pain is described as aching. The pain radiates to the left arm. The pain is at a severity of 5/10. The pain is moderate. The pain has been fluctuating since the incident. Pertinent negatives include no numbness. Nothing aggravates the symptoms. She has tried nothing for the symptoms.    Lab Results  Component Value Date   CREATININE 0.58 05/01/2020   BUN 10 05/01/2020   NA 139 05/01/2020   K 4.3 05/01/2020   CL 103 05/01/2020   CO2 19 (L) 05/01/2020   Lab Results  Component Value Date   CHOL 156 01/16/2020   HDL 40 01/16/2020   LDLCALC 105 (H) 01/16/2020   TRIG 56 01/16/2020   No results found for: TSH No results found for: HGBA1C Lab Results  Component Value Date   WBC 5.5 07/12/2020   HGB 14.4 07/12/2020   HCT 41.6 07/12/2020   MCV 91 07/12/2020   PLT 201 07/12/2020   Lab Results  Component Value Date   ALT 13  05/01/2020   AST 14 05/01/2020   ALKPHOS 78 05/01/2020   BILITOT 0.6 05/01/2020     Review of Systems  Constitutional: Negative.  Negative for appetite change, chills, fatigue, fever and unexpected weight change.  HENT: Negative for congestion, ear discharge, ear pain, rhinorrhea, sinus pressure, sneezing and sore throat.   Eyes: Negative for double vision, photophobia, pain, discharge, redness and itching.  Respiratory: Negative for cough, shortness of breath, wheezing and stridor.   Gastrointestinal: Negative for abdominal pain, blood in stool, constipation, diarrhea, nausea and vomiting.  Endocrine: Negative for cold intolerance, heat intolerance, polydipsia, polyphagia and polyuria.  Genitourinary: Negative for dysuria, flank pain, frequency, hematuria, menstrual problem, pelvic pain, urgency, vaginal bleeding and vaginal discharge.  Musculoskeletal: Negative for arthralgias, back pain and myalgias.  Skin: Negative for rash.  Allergic/Immunologic: Negative for environmental allergies and food allergies.  Neurological: Negative for dizziness, weakness, light-headedness, numbness and headaches.  Hematological: Negative for adenopathy. Does not bruise/bleed easily.  Psychiatric/Behavioral: Positive for depression. Negative for decreased concentration, dysphoric mood and suicidal ideas. The patient has insomnia. The patient is not nervous/anxious.     Patient Active Problem List   Diagnosis Date Noted  . Cesarean delivery delivered - Repeat 5/24 03/04/2018  . Postpartum care following vaginal delivery 03/04/2018  . Seizure-like activity ([place]) 11/30/2017  . Impaired  glucose tolerance test 09/23/2017  . Chronic pelvic pain in female 02/01/2012    Allergies  Allergen Reactions  . Hydrocodone Hives and Shortness Of Breath  . Aspirin Hives    Pt states that she can tolerate ibuprofen  . Pollen Extract Other (See Comments)    Seasonal allergy  . Shellfish Allergy Nausea And Vomiting   . Zoloft [Sertraline Hcl]   . Mold Extract [Trichophyton Mentagrophyte] Itching and Rash    Past Surgical History:  Procedure Laterality Date  . CESAREAN SECTION    . CESAREAN SECTION N/A 03/04/2018   Procedure: Repeat CESAREAN SECTION;  Surgeon: Charyl Bigger, MD;  Location: [facility];  Service: Obstetrics;  Laterality: N/A;  EDD: 03/10/18 Allergy: Aspirin, Shellfish  . CHOLECYSTECTOMY    . LAPAROSCOPY  2012   diagnose endometriosis  . TONSILLECTOMY      Social History   Tobacco Use  . Smoking status: Never Smoker  . Smokeless tobacco: Never Used  Vaping Use  . Vaping Use: Never used  Substance Use Topics  . Alcohol use: Yes    Alcohol/week: 2.0 standard drinks    Types: 2 Glasses of wine per week  . Drug use: No     Medication list has been reviewed and updated.  No outpatient medications have been marked as taking for the 11/22/20 encounter (Office Visit) with Juline Patch, MD.    [facility] 2/9 Scores 11/22/2020 01/11/2020 09/23/2017  PHQ - 2 Score 6 0 0  PHQ- 9 Score 18 1 -    GAD 7 : Generalized Anxiety Score 11/22/2020 01/11/2020  Nervous, Anxious, on Edge 3 0  Control/stop worrying 3 0  Worry too much - different things 3 0  Trouble relaxing 3 0  Restless 0 0  Easily annoyed or irritable 3 0  Afraid - awful might happen 0 0  Total GAD 7 Score 15 0  Anxiety Difficulty Somewhat difficult Not difficult at all    BP Readings from Last 3 Encounters:  11/22/20 120/80  07/12/20 120/60  05/01/20 112/77    Physical Exam Vitals and nursing note reviewed.  Constitutional:      General: She is irritable.     Appearance: She is well-developed and well-nourished.  HENT:     Head: Normocephalic.     Right Ear: Tympanic membrane, ear canal and external ear normal.     Left Ear: Tympanic membrane, ear canal and external ear normal.     Nose: Nose normal.     Mouth/Throat:     Mouth: Oropharynx is clear and moist.  Eyes:     General: Lids are everted,  no foreign bodies appreciated. No scleral icterus.       Left eye: No foreign body or hordeolum.     Extraocular Movements: EOM normal.     Conjunctiva/sclera: Conjunctivae normal.     Right eye: Right conjunctiva is not injected.     Left eye: Left conjunctiva is not injected.     Pupils: Pupils are equal, round, and reactive to light.  Neck:     Thyroid: No thyromegaly.     Vascular: No carotid bruit or JVD.     Trachea: No tracheal deviation.  Cardiovascular:     Rate and Rhythm: Normal rate and regular rhythm.     Pulses: Intact distal pulses.     Heart sounds: Normal heart sounds. No murmur heard. No friction rub. No gallop.   Pulmonary:     Effort: Pulmonary effort is normal.  No respiratory distress.     Breath sounds: Normal breath sounds. No wheezing, rhonchi or rales.  Abdominal:     General: Bowel sounds are normal.     Palpations: Abdomen is soft. There is no hepatosplenomegaly or mass.     Tenderness: There is no abdominal tenderness. There is no guarding or rebound.  Musculoskeletal:        General: No edema. Normal range of motion.     Left upper arm: Tenderness present. No swelling, edema, deformity, lacerations or bony tenderness.       Arms:     Cervical back: Normal range of motion and neck supple.  Lymphadenopathy:     Cervical: No cervical adenopathy.  Skin:    General: Skin is warm.     Findings: No rash.  Neurological:     Mental Status: She is alert and oriented to person, place, and time.     Cranial Nerves: No cranial nerve deficit.     Motor: Weakness present.     Deep Tendon Reflexes: Strength normal. Reflexes normal.  Psychiatric:        Mood and Affect: Mood and affect normal. Mood is not anxious or depressed.     Wt Readings from Last 3 Encounters:  11/22/20 189 lb (85.7 kg)  07/12/20 183 lb (83 kg)  01/11/20 211 lb (95.7 kg)    BP 120/80   Pulse 80   Ht 5\' 3"  (1.6 m)   Wt 189 lb (85.7 kg)   BMI 33.48 kg/m   Assessment and  Plan:  1. Adjustment reaction with anxiety and depression Resumed onset.  Recurrence.  Stable but in need of treatment.  Patient used to be on Prozac 20 mg once a day before she can do without has not been on so but with new responsibilities as mother and school patient is reached day point of concern.  We will resume fluoxetine 10 mg once a day for 2 weeks and then increase to 2 and will recheck in 6 weeks in the meantime we will make referral to psychiatry for any further evaluation. - Ambulatory referral to Psychiatry - FLUoxetine (PROZAC) 10 MG capsule; Take 1 capsule (10 mg total) by mouth daily. One a day for 2 weeks then 2 a day  Dispense: 90 capsule; Refill: 0  2. 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  We we will refer to psychiatry for evaluation of possible ADHD. - Ambulatory referral to Psychiatry  3. Triceps strain, left, initial encounter Patient had a fall in which there was hit the corner of a coffee table right in an area of the triceps tendon I believe I can palpate a little shelving and tenderness in that area suggesting that she is has an abortion will refer to orthopedics for evaluation in the meantime we will initiate meloxicam 7.5 mg once a day. - Ambulatory referral to Orthopedic Surgery - meloxicam (MOBIC) 7.5 MG tablet; Take 1 tablet (7.5 mg total) by mouth daily.  Dispense: 30 tablet; Refill: 0

## 2020-12-03 ENCOUNTER — Encounter: Payer: Self-pay | Admitting: Family Medicine

## 2020-12-09 DIAGNOSIS — M7542 Impingement syndrome of left shoulder: Secondary | ICD-10-CM | POA: Diagnosis not present

## 2021-01-07 ENCOUNTER — Other Ambulatory Visit (HOSPITAL_COMMUNITY): Payer: Self-pay | Admitting: Family Medicine

## 2021-01-07 ENCOUNTER — Other Ambulatory Visit: Payer: Self-pay

## 2021-01-07 ENCOUNTER — Encounter: Payer: Self-pay | Admitting: Family Medicine

## 2021-01-07 ENCOUNTER — Ambulatory Visit: Payer: 59 | Admitting: Family Medicine

## 2021-01-07 VITALS — BP 120/80 | HR 72 | Ht 63.0 in | Wt 193.0 lb

## 2021-01-07 DIAGNOSIS — F5101 Primary insomnia: Secondary | ICD-10-CM

## 2021-01-07 DIAGNOSIS — F4323 Adjustment disorder with mixed anxiety and depressed mood: Secondary | ICD-10-CM

## 2021-01-07 MED ORDER — TRAZODONE HCL 50 MG PO TABS: 25.0000 mg | ORAL_TABLET | Freq: Every evening | ORAL | 1 refills | Status: DC | PRN

## 2021-01-07 MED ORDER — FLUOXETINE HCL 20 MG PO CAPS: 20.0000 mg | ORAL_CAPSULE | Freq: Every day | ORAL | 0 refills | Status: DC

## 2021-01-07 MED FILL — FLUoxetine HCL 20 MG CAPS: 20 | 90 days supply | Qty: 90 | Fill #0

## 2021-01-07 MED FILL — traZODone HCL 50 MG TABS: 50 | 30 days supply | Qty: 30 | Fill #0

## 2021-01-07 NOTE — Progress Notes (Signed)
Date:  01/07/2021   Name:  [name]   DOB:  Nov 10, 1995   MRN:  [id number]   Chief Complaint: Depression (18 to 4 and 15 to 5/ having problems sleeping.)  Depression        This is a chronic problem.  The current episode started more than 1 year ago.   The onset quality is gradual.   The problem has been gradually improving since onset.  Associated symptoms include insomnia, irritable and restlessness.  Associated symptoms include no decreased concentration, no fatigue, no helplessness, no hopelessness, no decreased interest, no appetite change, no body aches, no myalgias, no headaches, no indigestion, not sad and no suicidal ideas.     The symptoms are aggravated by nothing.  Past treatments include SSRIs - Selective serotonin reuptake inhibitors.  Compliance with treatment is variable.  Previous treatment provided moderate relief. Insomnia Primary symptoms: fragmented sleep, difficulty falling asleep.  The current episode started more than one month. The problem occurs nightly. The problem has been gradually improving since onset. PMH includes: depression.    Lab Results  Component Value Date   CREATININE 0.58 05/01/2020   BUN 10 05/01/2020   NA 139 05/01/2020   K 4.3 05/01/2020   CL 103 05/01/2020   CO2 19 (L) 05/01/2020   Lab Results  Component Value Date   CHOL 156 01/16/2020   HDL 40 01/16/2020   LDLCALC 105 (H) 01/16/2020   TRIG 56 01/16/2020   No results found for: TSH No results found for: HGBA1C Lab Results  Component Value Date   WBC 5.5 07/12/2020   HGB 14.4 07/12/2020   HCT 41.6 07/12/2020   MCV 91 07/12/2020   PLT 201 07/12/2020   Lab Results  Component Value Date   ALT 13 05/01/2020   AST 14 05/01/2020   ALKPHOS 78 05/01/2020   BILITOT 0.6 05/01/2020     Review of Systems  Constitutional: Negative.  Negative for appetite change, chills, fatigue, fever and unexpected weight change.  HENT: Negative for congestion, ear discharge, ear pain,  rhinorrhea, sinus pressure, sneezing and sore throat.   Eyes: Negative for photophobia, pain, discharge, redness and itching.  Respiratory: Negative for cough, shortness of breath, wheezing and stridor.   Gastrointestinal: Negative for abdominal pain, blood in stool, constipation, diarrhea, nausea and vomiting.  Endocrine: Negative for cold intolerance, heat intolerance, polydipsia, polyphagia and polyuria.  Genitourinary: Negative for dysuria, flank pain, frequency, hematuria, menstrual problem, pelvic pain, urgency, vaginal bleeding and vaginal discharge.  Musculoskeletal: Negative for arthralgias, back pain and myalgias.  Skin: Negative for rash.  Allergic/Immunologic: Negative for environmental allergies and food allergies.  Neurological: Negative for dizziness, weakness, light-headedness, numbness and headaches.  Hematological: Negative for adenopathy. Does not bruise/bleed easily.  Psychiatric/Behavioral: Positive for depression. Negative for decreased concentration, dysphoric mood and suicidal ideas. The patient has insomnia. The patient is not nervous/anxious.     Patient Active Problem List   Diagnosis Date Noted  . Cesarean delivery delivered - Repeat 5/24 03/04/2018  . Postpartum care following vaginal delivery 03/04/2018  . Seizure-like activity ([place]) 11/30/2017  . Impaired glucose tolerance test 09/23/2017  . Chronic pelvic pain in female 02/01/2012    Allergies  Allergen Reactions  . Hydrocodone Hives and Shortness Of Breath  . Aspirin Hives    Pt states that she can tolerate ibuprofen  . Pollen Extract Other (See Comments)    Seasonal allergy  . Shellfish Allergy Nausea And Vomiting  . Zoloft [Sertraline Hcl]   .  Mold Extract [Trichophyton Mentagrophyte] Itching and Rash    Past Surgical History:  Procedure Laterality Date  . CESAREAN SECTION    . CESAREAN SECTION N/A 03/04/2018   Procedure: Repeat CESAREAN SECTION;  Surgeon: Charyl Bigger, MD;  Location: [facility];  Service: Obstetrics;  Laterality: N/A;  EDD: 03/10/18 Allergy: Aspirin, Shellfish  . CHOLECYSTECTOMY    . LAPAROSCOPY  2012   diagnose endometriosis  . TONSILLECTOMY      Social History   Tobacco Use  . Smoking status: Never Smoker  . Smokeless tobacco: Never Used  Vaping Use  . Vaping Use: Never used  Substance Use Topics  . Alcohol use: Yes    Alcohol/week: 2.0 standard drinks    Types: 2 Glasses of wine per week  . Drug use: No     Medication list has been reviewed and updated.  Current Meds  Medication Sig  . FLUoxetine (PROZAC) 10 MG capsule Take 1 capsule (10 mg total) by mouth daily. One a day for 2 weeks then 2 a day (Patient taking differently: Take 10 mg by mouth daily.)    PHQ 2/9 Scores 01/07/2021 11/22/2020 01/11/2020 09/23/2017  PHQ - 2 Score 1 6 0 0  PHQ- 9 Score 4 18 1  -    GAD 7 : Generalized Anxiety Score 01/07/2021 11/22/2020 01/11/2020  Nervous, Anxious, on Edge 1 3 0  Control/stop worrying 1 3 0  Worry too much - different things 1 3 0  Trouble relaxing 1 3 0  Restless 0 0 0  Easily annoyed or irritable 1 3 0  Afraid - awful might happen 0 0 0  Total GAD 7 Score 5 15 0  Anxiety Difficulty Somewhat difficult Somewhat difficult Not difficult at all    BP Readings from Last 3 Encounters:  01/07/21 120/80  11/22/20 120/80  07/12/20 120/60    Physical Exam Vitals and nursing note reviewed.  Constitutional:      General: She is irritable.     Appearance: She is well-developed.  HENT:     Head: Normocephalic.     Right Ear: Tympanic membrane and external ear normal.     Left Ear: Tympanic membrane and external ear normal.  Eyes:     General: Lids are everted, no foreign bodies appreciated. No scleral icterus.       Left eye: No foreign body or hordeolum.     Conjunctiva/sclera: Conjunctivae normal.     Right eye: Right conjunctiva is not injected.     Left eye: Left conjunctiva is not injected.     Pupils: Pupils are equal,  round, and reactive to light.  Neck:     Thyroid: No thyromegaly.     Vascular: No JVD.     Trachea: No tracheal deviation.  Cardiovascular:     Rate and Rhythm: Normal rate and regular rhythm.     Heart sounds: Normal heart sounds, S1 normal and S2 normal. No murmur heard.  No systolic murmur is present.  No diastolic murmur is present. No friction rub. No gallop. No S3 or S4 sounds.   Pulmonary:     Effort: Pulmonary effort is normal. No respiratory distress.     Breath sounds: Normal breath sounds. No wheezing or rales.  Abdominal:     General: Bowel sounds are normal.     Palpations: Abdomen is soft. There is no mass.     Tenderness: There is no abdominal tenderness. There is no guarding or rebound.  Musculoskeletal:  General: No tenderness. Normal range of motion.     Cervical back: Normal range of motion and neck supple.  Lymphadenopathy:     Cervical: No cervical adenopathy.  Skin:    General: Skin is warm.     Findings: No rash.  Neurological:     Mental Status: She is alert and oriented to person, place, and time.     Cranial Nerves: No cranial nerve deficit.     Deep Tendon Reflexes: Reflexes normal.  Psychiatric:        Mood and Affect: Mood is not anxious or depressed.     Wt Readings from Last 3 Encounters:  01/07/21 193 lb (87.5 kg)  11/22/20 189 lb (85.7 kg)  07/12/20 183 lb (83 kg)    BP 120/80   Pulse 72   Ht 5\' 3"  (1.6 m)   Wt 193 lb (87.5 kg)   BMI 34.19 kg/m   Assessment and Plan: 1. Adjustment reaction with anxiety and depression Chronic.  Recurrent.  Stable.  Patient went PHQ from 18-4 and gad score from 15-5.  Patient would like to increase the fluoxetine to 20 mg and we will do so on a daily basis.  We will recheck patient in 3 months. - FLUoxetine (PROZAC) 20 MG capsule; Take 1 capsule (20 mg total) by mouth daily.  Dispense: 90 capsule; Refill: 0  2. Primary insomnia New onset.  Persistent.  Stable.  Patient continues to have  initiation of sleep as well as fragmented with frequent awakening.  We will initiate trazodone 50 mg 1/2 to 1 tablet nightly for sleep and will recheck patient in 3 months. - FLUoxetine (PROZAC) 20 MG capsule; Take 1 capsule (20 mg total) by mouth daily.  Dispense: 90 capsule; Refill: 0 - traZODone (DESYREL) 50 MG tablet; Take 0.5-1 tablets (25-50 mg total) by mouth at bedtime as needed for sleep.  Dispense: 30 tablet; Refill: 1

## 2021-02-19 ENCOUNTER — Other Ambulatory Visit (HOSPITAL_BASED_OUTPATIENT_CLINIC_OR_DEPARTMENT_OTHER): Payer: Self-pay

## 2021-02-19 DIAGNOSIS — F330 Major depressive disorder, recurrent, mild: Secondary | ICD-10-CM | POA: Diagnosis not present

## 2021-02-19 DIAGNOSIS — F411 Generalized anxiety disorder: Secondary | ICD-10-CM | POA: Diagnosis not present

## 2021-02-19 DIAGNOSIS — F5105 Insomnia due to other mental disorder: Secondary | ICD-10-CM | POA: Diagnosis not present

## 2021-02-19 MED ORDER — TRAZODONE HCL 50 MG PO TABS
ORAL_TABLET | ORAL | 0 refills | Status: DC
  Filled 2021-02-19 – 2021-02-20 (×2): qty 30, 30d supply, fill #0

## 2021-02-19 MED ORDER — BUPROPION HCL ER (SR) 100 MG PO TB12
ORAL_TABLET | ORAL | 0 refills | Status: DC
  Filled 2021-02-19 – 2021-02-20 (×2): qty 60, 30d supply, fill #0

## 2021-02-20 ENCOUNTER — Other Ambulatory Visit (HOSPITAL_BASED_OUTPATIENT_CLINIC_OR_DEPARTMENT_OTHER): Payer: Self-pay

## 2021-02-20 ENCOUNTER — Other Ambulatory Visit (HOSPITAL_COMMUNITY): Payer: Self-pay

## 2021-02-24 DIAGNOSIS — F411 Generalized anxiety disorder: Secondary | ICD-10-CM | POA: Diagnosis not present

## 2021-02-24 DIAGNOSIS — F330 Major depressive disorder, recurrent, mild: Secondary | ICD-10-CM | POA: Diagnosis not present

## 2021-03-11 ENCOUNTER — Other Ambulatory Visit (HOSPITAL_BASED_OUTPATIENT_CLINIC_OR_DEPARTMENT_OTHER): Payer: Self-pay

## 2021-03-11 DIAGNOSIS — F5105 Insomnia due to other mental disorder: Secondary | ICD-10-CM | POA: Diagnosis not present

## 2021-03-11 DIAGNOSIS — F411 Generalized anxiety disorder: Secondary | ICD-10-CM | POA: Diagnosis not present

## 2021-03-11 DIAGNOSIS — F330 Major depressive disorder, recurrent, mild: Secondary | ICD-10-CM | POA: Diagnosis not present

## 2021-03-11 MED ORDER — CYANOCOBALAMIN 1000 MCG/ML IJ SOLN
INTRAMUSCULAR | 0 refills | Status: DC
  Filled 2021-03-11: qty 4, 60d supply, fill #0

## 2021-03-11 MED ORDER — TRAZODONE HCL 50 MG PO TABS
ORAL_TABLET | ORAL | 0 refills | Status: DC
  Filled 2021-03-11: qty 90, 90d supply, fill #0

## 2021-03-11 MED ORDER — BUPROPION HCL ER (SR) 100 MG PO TB12
ORAL_TABLET | ORAL | 0 refills | Status: DC
  Filled 2021-03-11 – 2021-04-02 (×3): qty 60, 30d supply, fill #0

## 2021-03-17 ENCOUNTER — Ambulatory Visit
Admission: RE | Admit: 2021-03-17 | Discharge: 2021-03-17 | Disposition: A | Discharge status: Home / Self Care | Payer: 59 | Source: Ambulatory Visit | Attending: Physician Assistant | Admitting: Physician Assistant

## 2021-03-17 ENCOUNTER — Other Ambulatory Visit: Payer: Self-pay

## 2021-03-17 VITALS — BP 117/71 | HR 79 | Temp 98.6°F | Resp 18 | Ht 63.0 in | Wt 192.9 lb

## 2021-03-17 DIAGNOSIS — J069 Acute upper respiratory infection, unspecified: Secondary | ICD-10-CM | POA: Insufficient documentation

## 2021-03-17 DIAGNOSIS — Z20822 Contact with and (suspected) exposure to covid-19: Secondary | ICD-10-CM | POA: Insufficient documentation

## 2021-03-17 DIAGNOSIS — J029 Acute pharyngitis, unspecified: Secondary | ICD-10-CM | POA: Diagnosis not present

## 2021-03-17 LAB — GROUP A STREP BY PCR: Group A Strep by PCR: NOT DETECTED

## 2021-03-17 MED ORDER — BENZONATATE 200 MG PO CAPS
200.0000 mg | ORAL_CAPSULE | Freq: Two times a day (BID) | ORAL | 0 refills | Status: DC | PRN
Start: 2021-03-17 — End: 2021-04-08

## 2021-03-17 NOTE — ED Provider Notes (Addendum)
[facility]    CSN: [id number] Arrival date & time: 03/17/21  1258      History   Chief Complaint Chief Complaint  Patient presents with   Appointment   Cough    HPI [name] is a 25 y.o. female who presents with cough, ST and subjective fever x 3 days. Her son has had URI. Her R ear has been draining dark brown matter  Past Medical History:  Diagnosis Date   Endometriosis    Gestational diabetes    diet controlled   Otitis media    Seizures [facility])     Patient Active Problem List   Diagnosis Date Noted   Cesarean delivery delivered - Repeat 5/24 03/04/2018   Postpartum care following vaginal delivery 03/04/2018   Seizure-like activity ([place]) 11/30/2017   Impaired glucose tolerance test 09/23/2017   Chronic pelvic pain in female 02/01/2012    Past Surgical History:  Procedure Laterality Date   CESAREAN SECTION     CESAREAN SECTION N/A 03/04/2018   Procedure: Repeat CESAREAN SECTION;  Surgeon: Charyl Bigger, MD;  Location: [place];  Service: Obstetrics;  Laterality: N/A;  EDD: 03/10/18 Allergy: Aspirin, Shellfish   CHOLECYSTECTOMY     LAPAROSCOPY  2012   diagnose endometriosis   TONSILLECTOMY      OB History     Gravida  3   Para  2   Term  2   Preterm      AB  1   Living  2      SAB  1   IAB      Ectopic      Multiple  0   Live Births  2            Home Medications    Prior to Admission medications   Medication Sig Start Date End Date Taking? Authorizing Provider  buPROPion (WELLBUTRIN SR) 100 MG 12 hr tablet Take 1 twice daily at 8am and 1pm 03/11/21  Yes   cyanocobalamin (,VITAMIN B-12,) 1000 MCG/ML injection Once injection every 2 weeks for a total of 4 injections 03/11/21  Yes   FLUoxetine (PROZAC) 20 MG capsule Take 1 capsule (20 mg total) by mouth daily. 01/07/21   Juline Patch, MD  FLUoxetine (PROZAC) 20 MG capsule TAKE 1 CAPSULE (20 MG TOTAL) BY MOUTH DAILY. 01/07/21 01/07/22  Juline Patch,  MD  FLUoxetine (PROZAC) 20 MG capsule TAKE 1 CAPSULE BY MOUTH DAILY 01/07/21 01/07/22  Juline Patch, MD  traZODone (DESYREL) 50 MG tablet Take 0.5-1 tablets (25-50 mg total) by mouth at bedtime as needed for sleep. 01/07/21   Juline Patch, MD  traZODone (DESYREL) 50 MG tablet Take 1 tablet by mouth at bedtime as needed 02/19/21     traZODone (DESYREL) 50 MG tablet Take 1 at bedtime as needed 03/11/21     traZODone (DESYREL) 50 MG tablet TAKE 1/2 TO 1 TABLET BY MOUTH AT BEDTIME AS NEEDED FOR SLEEP. 01/07/21 01/07/22  Juline Patch, MD  promethazine (PHENERGAN) 12.5 MG tablet Take 12.5 mg by mouth every 6 (six) hours as needed for nausea or vomiting.  04/17/19  [provider]  simethicone (MYLICON) 80 MG chewable tablet Chew 1 tablet (80 mg total) by mouth as needed for flatulence. 03/06/18 04/17/19  Juliene Pina, CNM    Family History Family History  Problem Relation Age of Onset   Healthy Mother    Diabetes Mother    Hearing loss Mother  Healthy Father     Social History Social History   Tobacco Use   Smoking status: Never Smoker   Smokeless tobacco: Never Used  Scientific laboratory technician Use: Never used  Substance Use Topics   Alcohol use: Yes    Alcohol/week: 2.0 standard drinks    Types: 2 Glasses of wine per week   Drug use: No     Allergies   Hydrocodone, Aspirin, Pollen extract, Shellfish allergy, Zoloft [sertraline hcl], and Mold extract [trichophyton mentagrophyte]   Review of Systems Review of Systems  Constitutional: Positive for appetite change, fatigue and fever. Negative for chills and diaphoresis.  HENT: Positive for congestion, postnasal drip, sinus pain and sore throat. Negative for trouble swallowing.   Eyes: Negative for discharge.  Respiratory: Positive for cough. Negative for apnea, shortness of breath and wheezing.   Gastrointestinal: Negative for diarrhea, nausea and vomiting.  Musculoskeletal: Positive for myalgias.       Mild  Skin: Negative  for rash.  Neurological: Positive for headaches.     Physical Exam Triage Vital Signs ED Triage Vitals  Enc Vitals Group     BP 03/17/21 1324 117/71     Pulse Rate 03/17/21 1324 79     Resp 03/17/21 1324 18     Temp 03/17/21 1324 98.6 F (37 C)     Temp Source 03/17/21 1324 Oral     SpO2 03/17/21 1324 98 %     Weight 03/17/21 1321 192 lb 14.4 oz (87.5 kg)     Height 03/17/21 1321 5\' 3"  (1.6 m)     Head Circumference --      Peak Flow --      Pain Score 03/17/21 1321 10     Pain Loc --      Pain Edu? --      Excl. in [place]? --    No data found.  Updated Vital Signs BP 117/71 (BP Location: Right Arm)   Pulse 79   Temp 98.6 F (37 C) (Oral)   Resp 18   Ht 5\' 3"  (1.6 m)   Wt 192 lb 14.4 oz (87.5 kg)   SpO2 98%   BMI 34.17 kg/m   Visual Acuity Right Eye Distance:   Left Eye Distance:   Bilateral Distance:    Right Eye Near:   Left Eye Near:    Bilateral Near:     Physical Exam Physical Exam Vitals signs and nursing note reviewed.  Constitutional:      General: She is not in acute distress.    Appearance: Normal appearance. She is not ill-appearing, toxic-appearing or diaphoretic.  HENT:     Head: Normocephalic.     Right Ear: Tympanic membrane, ear canal and external ear normal.     Left Ear: Tympanic membrane, ear canal and external ear normal.     Nose: with clear mucous    Mouth/Throat: slight irritation on pharynx noted, does not have tonsils.     Mouth: Mucous membranes are moist.  Eyes:     General: No scleral icterus.       Right eye: No discharge.        Left eye: No discharge.     Conjunctiva/sclera: Conjunctivae normal.  Neck:     Musculoskeletal: Neck supple. No neck rigidity.  Cardiovascular:     Rate and Rhythm: Normal rate and regular rhythm.     Heart sounds: No murmur.  Pulmonary:     Effort: Pulmonary effort is normal.  Breath sounds: Normal breath sounds.   Musculoskeletal: Normal range of motion.  Lymphadenopathy:      Cervical: No cervical adenopathy.  Skin:    General: Skin is warm and dry.     Coloration: Skin is not jaundiced.     Findings: No rash.  Neurological:     Mental Status: She is alert and oriented to person, place, and time.     Gait: Gait normal.  Psychiatric:        Mood and Affect: Mood normal.        Behavior: Behavior normal.        Thought Content: Thought content normal.        Judgment: Judgment normal.     UC Treatments / Results  Labs (all labs ordered are listed, but only abnormal results are displayed) Labs Reviewed  GROUP A STREP BY PCR  SARS CORONAVIRUS 2 (TAT 6-24 HRS)    EKG   Radiology No results found.  Procedures Procedures (including critical care time)  Medications Ordered in UC Medications - No data to display  Initial Impression / Assessment and Plan / UC Course  I have reviewed the triage vital signs and the nursing notes. Pertinent labs  results that were available during my care of the patient were reviewed by me and considered in my medical decision making (see chart for details). Has viral URI. Covid test is pending and needs to take precautions as educated.  I placed her on Tessalon for her cough.  Final Clinical Impressions(s) / UC Diagnoses   Final diagnoses:  None   Discharge Instructions   None    ED Prescriptions     None      PDMP not reviewed this encounter.   Shelby Mattocks, PA-C 03/17/21 1411    Rodriguez-Southworth, Sunday Spillers, PA-C 03/25/21 1018

## 2021-03-17 NOTE — ED Triage Notes (Signed)
Pt c/o cough, sore throat and subjective fever. Started about 3 days ago.

## 2021-03-17 NOTE — Discharge Instructions (Addendum)
Your strep test is negative. Stay quarantined until the covid test comes back.  You may use Cepastat throat spray as needed.  Viral respiratory illness last up to 2 weeks, but should not get worse with fever after 10 days. If so you need to be seen

## 2021-03-18 LAB — SARS CORONAVIRUS 2 (TAT 6-24 HRS): SARS Coronavirus 2: NEGATIVE

## 2021-03-27 ENCOUNTER — Other Ambulatory Visit (HOSPITAL_BASED_OUTPATIENT_CLINIC_OR_DEPARTMENT_OTHER): Payer: Self-pay

## 2021-04-02 ENCOUNTER — Other Ambulatory Visit (HOSPITAL_COMMUNITY): Payer: Self-pay

## 2021-04-02 ENCOUNTER — Other Ambulatory Visit (HOSPITAL_BASED_OUTPATIENT_CLINIC_OR_DEPARTMENT_OTHER): Payer: Self-pay

## 2021-04-07 ENCOUNTER — Other Ambulatory Visit (HOSPITAL_BASED_OUTPATIENT_CLINIC_OR_DEPARTMENT_OTHER): Payer: Self-pay

## 2021-04-07 ENCOUNTER — Other Ambulatory Visit (HOSPITAL_COMMUNITY): Payer: Self-pay

## 2021-04-07 DIAGNOSIS — F5105 Insomnia due to other mental disorder: Secondary | ICD-10-CM | POA: Diagnosis not present

## 2021-04-07 DIAGNOSIS — F330 Major depressive disorder, recurrent, mild: Secondary | ICD-10-CM | POA: Diagnosis not present

## 2021-04-07 DIAGNOSIS — F411 Generalized anxiety disorder: Secondary | ICD-10-CM | POA: Diagnosis not present

## 2021-04-07 MED ORDER — ESZOPICLONE 1 MG PO TABS
ORAL_TABLET | ORAL | 0 refills | Status: DC
  Filled 2021-04-07 (×2): qty 30, 30d supply, fill #0

## 2021-04-07 MED ORDER — BUPROPION HCL ER (SR) 150 MG PO TB12: ORAL_TABLET | ORAL | 0 refills | Status: DC

## 2021-04-07 MED ORDER — BUPROPION HCL ER (SR) 150 MG PO TB12
ORAL_TABLET | ORAL | 0 refills | Status: DC
  Filled 2021-04-07: qty 60, 30d supply, fill #0

## 2021-04-08 ENCOUNTER — Encounter: Payer: Self-pay | Admitting: Family Medicine

## 2021-04-08 ENCOUNTER — Other Ambulatory Visit: Payer: Self-pay

## 2021-04-08 ENCOUNTER — Ambulatory Visit: Payer: 59 | Admitting: Family Medicine

## 2021-04-08 ENCOUNTER — Other Ambulatory Visit (HOSPITAL_BASED_OUTPATIENT_CLINIC_OR_DEPARTMENT_OTHER): Payer: Self-pay

## 2021-04-08 VITALS — BP 100/70 | HR 72 | Ht 63.0 in | Wt 198.0 lb

## 2021-04-08 DIAGNOSIS — E663 Overweight: Secondary | ICD-10-CM

## 2021-04-08 DIAGNOSIS — F4323 Adjustment disorder with mixed anxiety and depressed mood: Secondary | ICD-10-CM

## 2021-04-08 NOTE — Patient Instructions (Signed)

## 2021-04-08 NOTE — Progress Notes (Signed)
Date:  04/08/2021   Name:  [name]   DOB:  01/03/1996   MRN:  [id number]   Chief Complaint: Follow-up (Seeing Dr Nicolasa Ducking- getting better since changing her med)  Depression        This is a new problem.  The current episode started more than 1 year ago.   The problem occurs intermittently.  The problem has been gradually improving since onset.  Associated symptoms include fatigue, insomnia and irritable.  Associated symptoms include no helplessness, no hopelessness, no restlessness, no decreased interest, no myalgias, no headaches, not sad and no suicidal ideas.     The symptoms are aggravated by work stress and family issues.  Past treatments include other medications.  Compliance with treatment is variable.  Previous treatment provided moderate relief.  Past medical history includes anxiety.   Anxiety Presents for follow-up visit. Symptoms include excessive worry, insomnia and nervous/anxious behavior. Patient reports no chest pain, compulsions, confusion, depressed mood, dizziness, feeling of choking, irritability, malaise, muscle tension, nausea, palpitations, panic, restlessness, shortness of breath or suicidal ideas. Symptoms occur occasionally.     Lab Results  Component Value Date   CREATININE 0.58 05/01/2020   BUN 10 05/01/2020   NA 139 05/01/2020   K 4.3 05/01/2020   CL 103 05/01/2020   CO2 19 (L) 05/01/2020   Lab Results  Component Value Date   CHOL 156 01/16/2020   HDL 40 01/16/2020   LDLCALC 105 (H) 01/16/2020   TRIG 56 01/16/2020   No results found for: TSH No results found for: HGBA1C Lab Results  Component Value Date   WBC 5.5 07/12/2020   HGB 14.4 07/12/2020   HCT 41.6 07/12/2020   MCV 91 07/12/2020   PLT 201 07/12/2020   Lab Results  Component Value Date   ALT 13 05/01/2020   AST 14 05/01/2020   ALKPHOS 78 05/01/2020   BILITOT 0.6 05/01/2020     Review of Systems  Constitutional:  Positive for fatigue. Negative for chills, fever and  irritability.  HENT:  Negative for drooling, ear discharge, ear pain and sore throat.   Respiratory:  Negative for cough, shortness of breath and wheezing.   Cardiovascular:  Negative for chest pain, palpitations and leg swelling.  Gastrointestinal:  Negative for abdominal pain, blood in stool, constipation, diarrhea and nausea.  Endocrine: Negative for polydipsia.  Genitourinary:  Negative for dysuria, frequency, hematuria and urgency.  Musculoskeletal:  Negative for back pain, myalgias and neck pain.  Skin:  Negative for rash.  Allergic/Immunologic: Negative for environmental allergies.  Neurological:  Negative for dizziness and headaches.  Hematological:  Does not bruise/bleed easily.  Psychiatric/Behavioral:  Positive for depression. Negative for confusion and suicidal ideas. The patient is nervous/anxious and has insomnia.    Patient Active Problem List   Diagnosis Date Noted   Cesarean delivery delivered - Repeat 5/24 03/04/2018   Postpartum care following vaginal delivery 03/04/2018   Seizure-like activity ([place]) 11/30/2017   Impaired glucose tolerance test 09/23/2017   Chronic pelvic pain in female 02/01/2012    Allergies  Allergen Reactions   Hydrocodone Hives and Shortness Of Breath   Aspirin Hives    Pt states that she can tolerate ibuprofen   Pollen Extract Other (See Comments)    Seasonal allergy   Shellfish Allergy Nausea And Vomiting   Zoloft [Sertraline Hcl]    Mold Extract [Trichophyton Mentagrophyte] Itching and Rash    Past Surgical History:  Procedure Laterality Date   CESAREAN SECTION  CESAREAN SECTION N/A 03/04/2018   Procedure: Repeat CESAREAN SECTION;  Surgeon: Charyl Bigger, MD;  Location: [place];  Service: Obstetrics;  Laterality: N/A;  EDD: 03/10/18 Allergy: Aspirin, Shellfish   CHOLECYSTECTOMY     LAPAROSCOPY  2012   diagnose endometriosis   TONSILLECTOMY      Social History   Tobacco Use   Smoking status: Never    Smokeless tobacco: Never  Vaping Use   Vaping Use: Never used  Substance Use Topics   Alcohol use: Yes    Alcohol/week: 2.0 standard drinks    Types: 2 Glasses of wine per week   Drug use: No     Medication list has been reviewed and updated.  Current Meds  Medication Sig   buPROPion (WELLBUTRIN SR) 150 MG 12 hr tablet Take 1 tablet by mouth twice daily at 8am and 1pm (Patient taking differently: Take by mouth. Dr Nicolasa Ducking)   cyanocobalamin (,VITAMIN B-12,) 1000 MCG/ML injection Once injection every 2 weeks for a total of 4 injections (Patient taking differently: Dr Nicolasa Ducking)   eszopiclone Johnnye Sima) 1 MG TABS tablet Take 1 tablet by mouth at bedtime as needed (Patient taking differently: Dr Nicolasa Ducking)    [facility] 2/9 Scores 04/08/2021 01/07/2021 11/22/2020 01/11/2020  PHQ - 2 Score 1 1 6  0  PHQ- 9 Score 7 4 18 1     GAD 7 : Generalized Anxiety Score 04/08/2021 01/07/2021 11/22/2020 01/11/2020  Nervous, Anxious, on Edge 2 1 3  0  Control/stop worrying 2 1 3  0  Worry too much - different things 2 1 3  0  Trouble relaxing 2 1 3  0  Restless 0 0 0 0  Easily annoyed or irritable 2 1 3  0  Afraid - awful might happen 0 0 0 0  Total GAD 7 Score 10 5 15  0  Anxiety Difficulty Somewhat difficult Somewhat difficult Somewhat difficult Not difficult at all    BP Readings from Last 3 Encounters:  04/08/21 100/70  03/17/21 117/71  01/07/21 120/80    Physical Exam Vitals and nursing note reviewed.  Constitutional:      General: She is irritable. She is not in acute distress.    Appearance: She is not diaphoretic.  HENT:     Head: Normocephalic and atraumatic.     Right Ear: Tympanic membrane, ear canal and external ear normal.     Left Ear: Tympanic membrane, ear canal and external ear normal.     Nose: Nose normal. No congestion or rhinorrhea.     Mouth/Throat:     Mouth: Mucous membranes are moist.  Eyes:     General:        Right eye: No discharge.        Left eye: No discharge.      Conjunctiva/sclera: Conjunctivae normal.     Pupils: Pupils are equal, round, and reactive to light.  Neck:     Thyroid: No thyromegaly.     Vascular: No JVD.  Cardiovascular:     Rate and Rhythm: Normal rate and regular rhythm.     Heart sounds: Normal heart sounds. No murmur heard.   No friction rub. No gallop.  Pulmonary:     Effort: Pulmonary effort is normal. No respiratory distress.     Breath sounds: Normal breath sounds. No wheezing, rhonchi or rales.  Abdominal:     General: Bowel sounds are normal. There is no distension.     Palpations: Abdomen is soft. There is no mass.     Tenderness:  There is no abdominal tenderness. There is no guarding.     Hernia: No hernia is present.  Musculoskeletal:        General: Normal range of motion.     Cervical back: Normal range of motion and neck supple.  Lymphadenopathy:     Cervical: No cervical adenopathy.  Skin:    General: Skin is warm and dry.  Neurological:     Mental Status: She is alert.     Deep Tendon Reflexes: Reflexes are normal and symmetric.    Wt Readings from Last 3 Encounters:  04/08/21 198 lb (89.8 kg)  03/17/21 192 lb 14.4 oz (87.5 kg)  01/07/21 193 lb (87.5 kg)    BP 100/70   Pulse 72   Ht 5\' 3"  (1.6 m)   Wt 198 lb (89.8 kg)   BMI 35.07 kg/m   Assessment and Plan:  1. Adjustment reaction with anxiety and depression Patient is currently being followed by Dr. Jake Michaelis and is currently on Wellbutrin.  She is also being followed by the ADHD clinic for evaluation of the possibility of treatment thereafter.  PHQ today is 7 with a gad score of 10 which is significantly improved from previous evaluation.  And patient is also improved since changing medication to Wellbutrin.  2. Overweight Health risks of being over weight were discussed and patient was counseled on weight loss options and exercise.  Patient is given a Mediterranean diet to assist with weight loss.

## 2021-04-10 ENCOUNTER — Ambulatory Visit: Payer: 59 | Admitting: Family Medicine

## 2021-04-21 ENCOUNTER — Ambulatory Visit: Payer: Self-pay

## 2021-04-21 ENCOUNTER — Other Ambulatory Visit: Payer: Self-pay | Admitting: Occupational Medicine

## 2021-04-21 ENCOUNTER — Other Ambulatory Visit: Payer: Self-pay

## 2021-04-21 DIAGNOSIS — M25562 Pain in left knee: Secondary | ICD-10-CM

## 2021-04-28 ENCOUNTER — Encounter: Payer: Self-pay | Admitting: Family Medicine

## 2021-04-28 ENCOUNTER — Ambulatory Visit (INDEPENDENT_AMBULATORY_CARE_PROVIDER_SITE_OTHER): Payer: 59 | Admitting: Family Medicine

## 2021-04-28 ENCOUNTER — Other Ambulatory Visit: Payer: Self-pay | Admitting: Family Medicine

## 2021-04-28 ENCOUNTER — Other Ambulatory Visit: Payer: Self-pay

## 2021-04-28 VITALS — BP 114/72 | HR 92 | Ht 63.0 in | Wt 197.0 lb

## 2021-04-28 DIAGNOSIS — Z0000 Encounter for general adult medical examination without abnormal findings: Secondary | ICD-10-CM

## 2021-04-28 DIAGNOSIS — L0291 Cutaneous abscess, unspecified: Secondary | ICD-10-CM | POA: Diagnosis not present

## 2021-04-28 MED ORDER — AMOXICILLIN-POT CLAVULANATE 875-125 MG PO TABS: 1.0000 | ORAL_TABLET | Freq: Two times a day (BID) | ORAL | 0 refills | Status: DC

## 2021-04-28 NOTE — Progress Notes (Signed)
Date:  04/28/2021   Name:  [name]   DOB:  1996/04/19   MRN:  [id number]   Chief Complaint: Annual Exam (Breast Exam.)  Patient is a 25 year old female who presents for a comprehensive physical exam. The patient reports the following problems: infested cyst. Health maintenance has been reviewed up to date.     Lab Results  Component Value Date   CREATININE 0.58 05/01/2020   BUN 10 05/01/2020   NA 139 05/01/2020   K 4.3 05/01/2020   CL 103 05/01/2020   CO2 19 (L) 05/01/2020   Lab Results  Component Value Date   CHOL 156 01/16/2020   HDL 40 01/16/2020   LDLCALC 105 (H) 01/16/2020   TRIG 56 01/16/2020   No results found for: TSH No results found for: HGBA1C Lab Results  Component Value Date   WBC 5.5 07/12/2020   HGB 14.4 07/12/2020   HCT 41.6 07/12/2020   MCV 91 07/12/2020   PLT 201 07/12/2020   Lab Results  Component Value Date   ALT 13 05/01/2020   AST 14 05/01/2020   ALKPHOS 78 05/01/2020   BILITOT 0.6 05/01/2020     Review of Systems  Constitutional:  Negative for chills and fever.  HENT:  Negative for drooling, ear discharge, ear pain and sore throat.   Respiratory:  Negative for cough, shortness of breath and wheezing.   Cardiovascular:  Negative for chest pain, palpitations and leg swelling.  Gastrointestinal:  Negative for abdominal pain, blood in stool, constipation, diarrhea and nausea.  Endocrine: Negative for polydipsia.  Genitourinary:  Negative for dysuria, frequency, hematuria and urgency.  Musculoskeletal:  Negative for back pain, myalgias and neck pain.  Skin:  Negative for rash.  Allergic/Immunologic: Negative for environmental allergies.  Neurological:  Negative for dizziness and headaches.  Hematological:  Does not bruise/bleed easily.  Psychiatric/Behavioral:  Negative for suicidal ideas. The patient is not nervous/anxious.    Patient Active Problem List   Diagnosis Date Noted   Cesarean delivery delivered - Repeat 5/24  03/04/2018   Postpartum care following vaginal delivery 03/04/2018   Seizure-like activity ([place]) 11/30/2017   Impaired glucose tolerance test 09/23/2017   Chronic pelvic pain in female 02/01/2012    Allergies  Allergen Reactions   Hydrocodone Hives and Shortness Of Breath   Aspirin Hives    Pt states that she can tolerate ibuprofen   Pollen Extract Other (See Comments)    Seasonal allergy   Shellfish Allergy Nausea And Vomiting   Zoloft [Sertraline Hcl]    Mold Extract [Trichophyton Mentagrophyte] Itching and Rash    Past Surgical History:  Procedure Laterality Date   CESAREAN SECTION     CESAREAN SECTION N/A 03/04/2018   Procedure: Repeat CESAREAN SECTION;  Surgeon: Charyl Bigger, MD;  Location: [place];  Service: Obstetrics;  Laterality: N/A;  EDD: 03/10/18 Allergy: Aspirin, Shellfish   CHOLECYSTECTOMY     LAPAROSCOPY  2012   diagnose endometriosis   TONSILLECTOMY      Social History   Tobacco Use   Smoking status: Never   Smokeless tobacco: Never  Vaping Use   Vaping Use: Never used  Substance Use Topics   Alcohol use: Yes    Alcohol/week: 2.0 standard drinks    Types: 2 Glasses of wine per week   Drug use: No     Medication list has been reviewed and updated.  Current Meds  Medication Sig   buPROPion (WELLBUTRIN SR) 150 MG  12 hr tablet Take 1 tablet by mouth twice daily at 8am and 1pm (Patient taking differently: Take by mouth. Dr Nicolasa Ducking)   cyanocobalamin (,VITAMIN B-12,) 1000 MCG/ML injection Once injection every 2 weeks for a total of 4 injections (Patient taking differently: Dr Nicolasa Ducking)   eszopiclone Johnnye Sima) 1 MG TABS tablet Take 1 tablet by mouth at bedtime as needed (Patient taking differently: Dr Nicolasa Ducking)    [facility] 2/9 Scores 04/28/2021 04/08/2021 01/07/2021 11/22/2020  PHQ - 2 Score 2 1 1 6   PHQ- 9 Score 6 7 4 18     GAD 7 : Generalized Anxiety Score 04/28/2021 04/08/2021 01/07/2021 11/22/2020  Nervous, Anxious, on Edge 2 2 1 3   Control/stop  worrying 2 2 1 3   Worry too much - different things 2 2 1 3   Trouble relaxing 2 2 1 3   Restless 0 0 0 0  Easily annoyed or irritable 0 2 1 3   Afraid - awful might happen 0 0 0 0  Total GAD 7 Score 8 10 5 15   Anxiety Difficulty Not difficult at all Somewhat difficult Somewhat difficult Somewhat difficult    BP Readings from Last 3 Encounters:  04/28/21 114/72  04/08/21 100/70  03/17/21 117/71    Physical Exam Vitals and nursing note reviewed.  Constitutional:      General: She is not in acute distress.    Appearance: Normal appearance. She is well-developed, well-groomed and overweight. She is not diaphoretic.  HENT:     Head: Normocephalic and atraumatic.     Jaw: There is normal jaw occlusion.     Right Ear: Hearing, tympanic membrane, ear canal and external ear normal. There is no impacted cerumen.     Left Ear: Hearing, tympanic membrane, ear canal and external ear normal. There is no impacted cerumen.     Nose: Nose normal. No congestion or rhinorrhea.     Mouth/Throat:     Lips: Pink.     Mouth: Mucous membranes are moist.     Dentition: Normal dentition.     Pharynx: Oropharynx is clear. Uvula midline.  Eyes:     General: Lids are normal. Vision grossly intact. Gaze aligned appropriately. No scleral icterus.       Right eye: No discharge.        Left eye: No discharge.     Extraocular Movements: Extraocular movements intact.     Conjunctiva/sclera: Conjunctivae normal.     Pupils: Pupils are equal, round, and reactive to light.  Neck:     Thyroid: No thyroid mass, thyromegaly or thyroid tenderness.     Vascular: Normal carotid pulses. No carotid bruit, hepatojugular reflux or JVD.     Trachea: Trachea and phonation normal.  Cardiovascular:     Rate and Rhythm: Normal rate and regular rhythm.     Chest Wall: PMI is not displaced. No thrill.     Pulses: Normal pulses.          Carotid pulses are 2+ on the right side and 2+ on the left side.      Radial pulses are  2+ on the right side and 2+ on the left side.       Femoral pulses are 2+ on the right side and 2+ on the left side.      Popliteal pulses are 2+ on the right side and 2+ on the left side.       Dorsalis pedis pulses are 2+ on the right side and 2+ on the left side.  Posterior tibial pulses are 2+ on the right side and 2+ on the left side.     Heart sounds: Normal heart sounds, S1 normal and S2 normal. No murmur heard. No systolic murmur is present.  No diastolic murmur is present.    No friction rub. No gallop. No S3 or S4 sounds.  Pulmonary:     Effort: Pulmonary effort is normal. No respiratory distress.     Breath sounds: Normal breath sounds. No stridor. No decreased breath sounds, wheezing, rhonchi or rales.  Chest:     Chest wall: No tenderness.  Breasts:    Breasts are symmetrical.     Right: No supraclavicular adenopathy.     Left: No supraclavicular adenopathy.  Abdominal:     General: Bowel sounds are normal.     Palpations: Abdomen is soft. There is no hepatomegaly, splenomegaly or mass.     Tenderness: There is no abdominal tenderness. There is no guarding.     Hernia: There is no hernia in the umbilical area or ventral area.  Musculoskeletal:        General: Normal range of motion.     Cervical back: Full passive range of motion without pain, normal range of motion and neck supple.     Right lower leg: No edema.     Left lower leg: No edema.  Lymphadenopathy:     Head:     Right side of head: No submental, submandibular or tonsillar adenopathy.     Left side of head: No submental, submandibular or tonsillar adenopathy.     Cervical: No cervical adenopathy.     Right cervical: No superficial, deep or posterior cervical adenopathy.    Left cervical: No superficial, deep or posterior cervical adenopathy.     Upper Body:     Right upper body: No supraclavicular adenopathy.     Left upper body: No supraclavicular adenopathy.  Skin:    General: Skin is warm and  dry.     Capillary Refill: Capillary refill takes less than 2 seconds.     Findings: No abrasion or abscess.  Neurological:     Mental Status: She is alert.     Cranial Nerves: Cranial nerves are intact.     Sensory: Sensation is intact.     Deep Tendon Reflexes: Reflexes are normal and symmetric.     Reflex Scores:      Tricep reflexes are 2+ on the right side and 2+ on the left side.      Bicep reflexes are 2+ on the right side and 2+ on the left side.      Brachioradialis reflexes are 2+ on the right side and 2+ on the left side.      Patellar reflexes are 2+ on the right side and 2+ on the left side.      Achilles reflexes are 2+ on the right side and 2+ on the left side. Psychiatric:        Behavior: Behavior is cooperative.    Wt Readings from Last 3 Encounters:  04/28/21 197 lb (89.4 kg)  04/08/21 198 lb (89.8 kg)  03/17/21 192 lb 14.4 oz (87.5 kg)    BP 114/72 (BP Location: Left Arm, Patient Position: Sitting, Cuff Size: Normal)   Pulse 92   Ht 5\' 3"  (1.6 m)   Wt 197 lb (89.4 kg)   LMP  (Exact Date)   SpO2 99%   BMI 34.90 kg/m   Assessment and Plan: Shayla R Riker is a  25 y.o. female who presents today for her Complete Annual Exam. She feels well. She reports exercising minimal as tolerated with ambulation. She reports she is sleeping well.  Patient's chart was reviewed and there was no concerns with previous encounters most recent labs most recent imaging in [place]. 1. Annual physical exam Immunizations are reviewed and recommendations provided.   Age appropriate screening tests are discussed. Counseling given for risk factor reduction interventions.  No subjective/objective concerns noted with history and physical exam.  Will check CMP, CBC, and lipid panel. - Comprehensive metabolic panel - CBC with Differential/Platelet - Lipid Panel With LDL/HDL Ratio  2. Abscess New onset.  Approximately 2 weeks ago there was a mosquito bite that evolved into an  area of swelling and erythema.  Patient probably has a small abscess and a palpable anterior cervical lymph node consistent with likely infection.  Patient will be started on Augmentin 875 mg twice a day and we will check a CBC.  We have referred to general surgery for I&D on Wednesday. - amoxicillin-clavulanate (AUGMENTIN) 875-125 MG tablet; Take 1 tablet by mouth 2 (two) times daily.  Dispense: 20 tablet; Refill: 0 - CBC with Differential/Platelet - Ambulatory referral to General Surgery

## 2021-04-29 LAB — CBC WITH DIFFERENTIAL/PLATELET
Basophils Absolute: 0 10*3/uL (ref 0.0–0.2)
Basos: 1 %
EOS (ABSOLUTE): 0.1 10*3/uL (ref 0.0–0.4)
Eos: 2 %
Hematocrit: 44.8 % (ref 34.0–46.6)
Hemoglobin: 15.1 g/dL (ref 11.1–15.9)
Immature Grans (Abs): 0 10*3/uL (ref 0.0–0.1)
Immature Granulocytes: 1 %
Lymphocytes Absolute: 1.5 10*3/uL (ref 0.7–3.1)
Lymphs: 32 %
MCH: 30.4 pg (ref 26.6–33.0)
MCHC: 33.7 g/dL (ref 31.5–35.7)
MCV: 90 fL (ref 79–97)
Monocytes Absolute: 0.5 10*3/uL (ref 0.1–0.9)
Monocytes: 11 %
Neutrophils Absolute: 2.5 10*3/uL (ref 1.4–7.0)
Neutrophils: 53 %
Platelets: 173 10*3/uL (ref 150–450)
RBC: 4.97 x10E6/uL (ref 3.77–5.28)
RDW: 12 % (ref 11.7–15.4)
WBC: 4.8 10*3/uL (ref 3.4–10.8)

## 2021-04-29 LAB — COMPREHENSIVE METABOLIC PANEL
ALT: 21 IU/L (ref 0–32)
AST: 14 IU/L (ref 0–40)
Albumin/Globulin Ratio: 2 (ref 1.2–2.2)
Albumin: 4.5 g/dL (ref 3.9–5.0)
Alkaline Phosphatase: 68 IU/L (ref 44–121)
BUN/Creatinine Ratio: 15 (ref 9–23)
BUN: 11 mg/dL (ref 6–20)
Bilirubin Total: 0.5 mg/dL (ref 0.0–1.2)
CO2: 24 mmol/L (ref 20–29)
Calcium: 9.6 mg/dL (ref 8.7–10.2)
Chloride: 102 mmol/L (ref 96–106)
Creatinine, Ser: 0.74 mg/dL (ref 0.57–1.00)
Globulin, Total: 2.3 g/dL (ref 1.5–4.5)
Glucose: 85 mg/dL (ref 65–99)
Potassium: 4.1 mmol/L (ref 3.5–5.2)
Sodium: 138 mmol/L (ref 134–144)
Total Protein: 6.8 g/dL (ref 6.0–8.5)
eGFR: 114 mL/min/{1.73_m2} (ref >59)

## 2021-04-29 LAB — LIPID PANEL WITH LDL/HDL RATIO
Cholesterol, Total: 145 mg/dL (ref 100–199)
HDL: 35 mg/dL — ABNORMAL LOW (ref >39)
LDL Chol Calc (NIH): 95 mg/dL (ref 0–99)
LDL/HDL Ratio: 2.7 ratio (ref 0.0–3.2)
Triglycerides: 75 mg/dL (ref 0–149)
VLDL Cholesterol Cal: 15 mg/dL (ref 5–40)

## 2021-04-30 ENCOUNTER — Ambulatory Visit: Payer: Self-pay | Admitting: Family Medicine

## 2021-05-01 ENCOUNTER — Encounter: Payer: Self-pay | Admitting: Surgery

## 2021-05-01 ENCOUNTER — Other Ambulatory Visit: Payer: Self-pay

## 2021-05-01 ENCOUNTER — Ambulatory Visit (INDEPENDENT_AMBULATORY_CARE_PROVIDER_SITE_OTHER): Payer: 59 | Admitting: Surgery

## 2021-05-01 VITALS — BP 115/76 | HR 80 | Temp 98.1°F | Ht 64.0 in | Wt 201.0 lb

## 2021-05-01 DIAGNOSIS — L723 Sebaceous cyst: Secondary | ICD-10-CM

## 2021-05-01 NOTE — Patient Instructions (Signed)
If you have any concerns or questions, please feel free to call our office. See follow up appointment below.  Incision and Drainage, Care After  This sheet gives you information about how to care for yourself after your procedure. Your health care provider may also give you more specific instructions. If you have problems or questions, contact your health careprovider. What can I expect after the procedure? After the procedure, it is common to have: Pain or discomfort around the incision site. Blood, fluid, or pus (drainage) from the incision. Redness and firm skin around the incision site. Follow these instructions at home: Medicines Take over-the-counter and prescription medicines only as told by your health care provider. If you were prescribed an antibiotic medicine, use or take it as told by your health care provider. Do not stop using the antibiotic even if you start to feel better. Wound care Follow instructions from your health care provider about how to take care of your wound. Make sure you: Wash your hands with soap and water before and after you change your bandage (dressing). If soap and water are not available, use hand sanitizer. Change your dressing and packing as told by your health care provider. If your dressing is dry or stuck when you try to remove it, moisten or wet the dressing with saline or water so that it can be removed without harming your skin or tissues. If your wound is packed, leave it in place until your health care provider tells you to remove it. To remove the packing, moisten or wet the packing with saline or water so that it can be removed without harming your skin or tissues. Leave stitches (sutures), skin glue, or adhesive strips in place. These skin closures may need to stay in place for 2 weeks or longer. If adhesive strip edges start to loosen and curl up, you may trim the loose edges. Do not remove adhesive strips completely unless your health care  provider tells you to do that. Check your wound every day for signs of infection. Check for: More redness, swelling, or pain. More fluid or blood. Warmth. Pus or a bad smell. If you were sent home with a drain tube in place, follow instructions from your health care provider about: How to empty it. How to care for it at home.  General instructions Rest the affected area. Do not take baths, swim, or use a hot tub until your health care provider approves. Ask your health care provider if you may take showers. You may only be allowed to take sponge baths. Return to your normal activities as told by your health care provider. Ask your health care provider what activities are safe for you. Your health care provider may put you on activity or lifting restrictions. The incision will continue to drain. It is normal to have some clear or slightly bloody drainage. The amount of drainage should lessen each day. Do not apply any creams, ointments, or liquids unless you have been told to by your health care provider. Keep all follow-up visits as told by your health care provider. This is important. Contact a health care provider if: Your cyst or abscess returns. You have a fever or chills. You have more redness, swelling, or pain around your incision. You have more fluid or blood coming from your incision. Your incision feels warm to the touch. You have pus or a bad smell coming from your incision. You have red streaks above or below the incision site. Get help right  away if: You have severe pain or bleeding. You cannot eat or drink without vomiting. You have decreased urine output. You become short of breath. You have chest pain. You cough up blood. The affected area becomes numb or starts to tingle. These symptoms may represent a serious problem that is an emergency. Do not wait to see if the symptoms will go away. Get medical help right away. Call your local emergency services (911 in the  [place] Do not drive yourself to the hospital. Summary After this procedure, it is common to have fluid, blood, or pus coming from the surgery site. Follow all home care instructions. You will be told how to take care of your incision, how to check for infection, and how to take medicines. If you were prescribed an antibiotic medicine, take it as told by your health care provider. Do not stop taking the antibiotic even if you start to feel better. Contact a health care provider if you have increased redness, swelling, or pain around your incision. Get help right away if you have chest pain, you vomit, you cough up blood, or you have shortness of breath. Keep all follow-up visits as told by your health care provider. This is important. This information is not intended to replace advice given to you by your health care provider. Make sure you discuss any questions you have with your healthcare provider. Document Revised: 08/29/2018 Document Reviewed: 08/29/2018 Elsevier Patient Education  2022 [employer].

## 2021-05-01 NOTE — Progress Notes (Signed)
Patient ID: [name], female   DOB: [birth year], 25 y.o.   MRN: [id number]  Chief Complaint: Left neck abscess  History of Present Illness Kariel R Jobst is a 25 y.o. female with a cystic lesion of the upper left neck, which became more indurated/inflamed following some manipulation.  Is been going on for 2 weeks.  She attempted lancing with an insulin syringe needle without effect, significant drainage.  Now having 3 days of Augmentin on board, notes some degree of improvement but persistence of the tender fluctuant mass.  She denies fevers and chills. She denies nausea, vomiting.  She is tolerating Augmentin well. Past Medical History Past Medical History:  Diagnosis Date   Endometriosis    Gestational diabetes    diet controlled   Otitis media    Seizures ([place])       Past Surgical History:  Procedure Laterality Date   CESAREAN SECTION     CESAREAN SECTION N/A 03/04/2018   Procedure: Repeat CESAREAN SECTION;  Surgeon: Charyl Bigger, MD;  Location: [place];  Service: Obstetrics;  Laterality: N/A;  EDD: 03/10/18 Allergy: Aspirin, Shellfish   CHOLECYSTECTOMY     LAPAROSCOPY  2012   diagnose endometriosis   TONSILLECTOMY      Allergies  Allergen Reactions   Hydrocodone Hives and Shortness Of Breath   Aspirin Hives    Pt states that she can tolerate ibuprofen   Pollen Extract Other (See Comments)    Seasonal allergy   Shellfish Allergy Nausea And Vomiting   Zoloft [Sertraline Hcl]    Mold Extract [Trichophyton Mentagrophyte] Itching and Rash    Current Outpatient Medications  Medication Sig Dispense Refill   amoxicillin-clavulanate (AUGMENTIN) 875-125 MG tablet Take 1 tablet by mouth 2 (two) times daily. 20 tablet 0   buPROPion (WELLBUTRIN SR) 150 MG 12 hr tablet Take 1 tablet by mouth twice daily at 8am and 1pm (Patient taking differently: Take by mouth. Dr Nicolasa Ducking) 60 tablet 0   cyanocobalamin (,VITAMIN B-12,) 1000 MCG/ML injection Once injection  every 2 weeks for a total of 4 injections (Patient taking differently: Dr Nicolasa Ducking) 4 mL 0   eszopiclone (LUNESTA) 1 MG TABS tablet Take 1 tablet by mouth at bedtime as needed (Patient taking differently: Dr Nicolasa Ducking) 30 tablet 0   No current facility-administered medications for this visit.    Family History Family History  Problem Relation Age of Onset   Healthy Mother    Diabetes Mother    Hearing loss Mother    Healthy Father       Social History Social History   Tobacco Use   Smoking status: Never   Smokeless tobacco: Never  Vaping Use   Vaping Use: Never used  Substance Use Topics   Alcohol use: Yes    Alcohol/week: 2.0 standard drinks    Types: 2 Glasses of wine per week   Drug use: No        Review of Systems  Constitutional:  Negative for chills and fever.  HENT: Negative.    Eyes: Negative.   Respiratory: Negative.    Cardiovascular: Negative.   Gastrointestinal: Negative.   Genitourinary: Negative.   Skin: Negative.   Neurological: Negative.   Psychiatric/Behavioral:  Positive for depression.      Physical Exam Blood pressure 115/76, pulse 80, temperature 98.1 F (36.7 C), temperature source Oral, height 5\' 4"  (1.626 m), weight 201 lb (91.2 kg), SpO2 98 %. Last Weight  Most recent update: 05/01/2021 10:38 AM  Weight  91.2 kg (201 lb)             CONSTITUTIONAL: Well developed, and nourished, appropriately responsive and aware without distress.   EYES: Sclera non-icteric.   EARS, NOSE, MOUTH AND THROAT: Mask worn.   The oropharynx is clear. Oral mucosa is pink and moist.  See skin exam.   Hearing is intact to voice.  NECK: Trachea is midline, and there is no jugular venous distension.  LYMPH NODES:  Lymph nodes in the neck are not enlarged. RESPIRATORY:  Lungs are clear, and breath sounds are equal bilaterally. Normal respiratory effort without pathologic use of accessory muscles. MUSCULOSKELETAL:  Symmetrical muscle tone appreciated in all four  extremities.    SKIN: Skin turgor is normal. No pathologic skin lesions appreciated.  On her left upper neck overlying the upper third of the sternocleidomastoid, there is a fluctuant pink area without significant surrounding induration.  It remains tender, there may be a punctum notable in the center of the area. NEUROLOGIC:  Motor and sensation appear grossly normal.  Cranial nerves are grossly without defect. PSYCH:  Alert and oriented to person, place and time. Affect is appropriate for situation.  Data Reviewed I have personally reviewed what is currently available of the patient's imaging, recent labs and medical records.   Labs:  CBC Latest Ref Rng & Units 04/28/2021 07/12/2020 05/01/2020  WBC 3.4 - 10.8 x10E3/uL 4.8 5.5 6.8  Hemoglobin 11.1 - 15.9 g/dL 15.1 14.4 15.7  Hematocrit 34.0 - 46.6 % 44.8 41.6 45.5  Platelets 150 - 450 x10E3/uL 173 201 183   CMP Latest Ref Rng & Units 04/28/2021 05/01/2020 01/16/2020  Glucose 65 - 99 mg/dL 85 103(H) 106(H)  BUN 6 - 20 mg/dL 11 10 6   Creatinine 0.57 - 1.00 mg/dL 0.74 0.58 0.61  Sodium 134 - 144 mmol/L 138 139 143  Potassium 3.5 - 5.2 mmol/L 4.1 4.3 4.0  Chloride 96 - 106 mmol/L 102 103 106  CO2 20 - 29 mmol/L 24 19(L) 22  Calcium 8.7 - 10.2 mg/dL 9.6 9.2 8.5(L)  Total Protein 6.0 - 8.5 g/dL 6.8 6.9 -  Total Bilirubin 0.0 - 1.2 mg/dL 0.5 0.6 -  Alkaline Phos 44 - 121 IU/L 68 78 -  AST 0 - 40 IU/L 14 14 -  ALT 0 - 32 IU/L 21 13 -      Imaging:  Within last 24 hrs: No results found.  Assessment    Left neck abscess/inflamed sebaceous cyst. Patient Active Problem List   Diagnosis Date Noted   Cesarean delivery delivered - Repeat 5/24 03/04/2018   Postpartum care following vaginal delivery 03/04/2018   Seizure-like activity ([place]) 11/30/2017   Impaired glucose tolerance test 09/23/2017   Chronic pelvic pain in female 02/01/2012    Plan    Incision and drainage.  With informed consent obtained.  The patient was placed in a right  lateral decubitus position with the left portion of her neck available.  The areas were prepped with ChloraPrep and draped in the usual sterile manner.  Local infiltration with 1% lidocaine with epinephrine was performed to adequate effect.  A 1 cm incision was made with 11 blade, and the wound explored to ensure adequate retrieval/removal of cystic contents.  Utilized a Q-tip to ensure adequate cleansing.  Because of the small incision I advise she cleanse the wound daily/twice daily with hydrogen peroxide and Q-tip and cover with a Band-Aid.  Anticipate follow-up in 2 weeks or as needed.  Face-to-face time spent with the patient and accompanying care providers(if present) was 30 minutes, with more than 50% of the time spent counseling, educating, and coordinating care of the patient.    These notes generated with voice recognition software. I apologize for typographical errors.  Ronny Bacon M.D., FACS 05/01/2021, 11:04 AM

## 2021-05-02 ENCOUNTER — Other Ambulatory Visit (HOSPITAL_BASED_OUTPATIENT_CLINIC_OR_DEPARTMENT_OTHER): Payer: Self-pay

## 2021-05-02 DIAGNOSIS — F330 Major depressive disorder, recurrent, mild: Secondary | ICD-10-CM | POA: Diagnosis not present

## 2021-05-02 DIAGNOSIS — F5105 Insomnia due to other mental disorder: Secondary | ICD-10-CM | POA: Diagnosis not present

## 2021-05-02 DIAGNOSIS — F411 Generalized anxiety disorder: Secondary | ICD-10-CM | POA: Diagnosis not present

## 2021-05-02 MED ORDER — BUPROPION HCL ER (SR) 150 MG PO TB12
ORAL_TABLET | ORAL | 0 refills | Status: DC
  Filled 2021-05-02 – 2021-06-02 (×2): qty 180, 90d supply, fill #0

## 2021-05-02 MED ORDER — ESZOPICLONE 1 MG PO TABS
ORAL_TABLET | ORAL | 0 refills | Status: DC
  Filled 2021-05-02 – 2021-06-04 (×2): qty 90, 90d supply, fill #0

## 2021-05-05 ENCOUNTER — Other Ambulatory Visit (HOSPITAL_BASED_OUTPATIENT_CLINIC_OR_DEPARTMENT_OTHER): Payer: Self-pay

## 2021-05-07 DIAGNOSIS — N809 Endometriosis, unspecified: Secondary | ICD-10-CM | POA: Insufficient documentation

## 2021-05-07 DIAGNOSIS — Z862 Personal history of diseases of the blood and blood-forming organs and certain disorders involving the immune mechanism: Secondary | ICD-10-CM | POA: Insufficient documentation

## 2021-05-07 DIAGNOSIS — F32A Depression, unspecified: Secondary | ICD-10-CM | POA: Insufficient documentation

## 2021-05-07 DIAGNOSIS — G40909 Epilepsy, unspecified, not intractable, without status epilepticus: Secondary | ICD-10-CM | POA: Insufficient documentation

## 2021-05-07 DIAGNOSIS — A6000 Herpesviral infection of urogenital system, unspecified: Secondary | ICD-10-CM | POA: Insufficient documentation

## 2021-05-07 DIAGNOSIS — Z8489 Family history of other specified conditions: Secondary | ICD-10-CM | POA: Insufficient documentation

## 2021-05-12 DIAGNOSIS — Z6834 Body mass index (BMI) 34.0-34.9, adult: Secondary | ICD-10-CM | POA: Diagnosis not present

## 2021-05-12 DIAGNOSIS — Z124 Encounter for screening for malignant neoplasm of cervix: Secondary | ICD-10-CM | POA: Diagnosis not present

## 2021-05-12 DIAGNOSIS — F419 Anxiety disorder, unspecified: Secondary | ICD-10-CM | POA: Insufficient documentation

## 2021-05-12 DIAGNOSIS — Z01419 Encounter for gynecological examination (general) (routine) without abnormal findings: Secondary | ICD-10-CM | POA: Diagnosis not present

## 2021-05-12 DIAGNOSIS — Z01411 Encounter for gynecological examination (general) (routine) with abnormal findings: Secondary | ICD-10-CM | POA: Diagnosis not present

## 2021-05-12 DIAGNOSIS — Z113 Encounter for screening for infections with a predominantly sexual mode of transmission: Secondary | ICD-10-CM | POA: Diagnosis not present

## 2021-05-12 LAB — HM PAP SMEAR: HM Pap smear: NEGATIVE

## 2021-05-14 DIAGNOSIS — T8389XD Other specified complication of genitourinary prosthetic devices, implants and grafts, subsequent encounter: Secondary | ICD-10-CM | POA: Diagnosis not present

## 2021-05-15 ENCOUNTER — Encounter: Payer: Self-pay | Admitting: Surgery

## 2021-05-15 ENCOUNTER — Other Ambulatory Visit: Payer: Self-pay

## 2021-05-15 ENCOUNTER — Ambulatory Visit (INDEPENDENT_AMBULATORY_CARE_PROVIDER_SITE_OTHER): Payer: 59 | Admitting: Surgery

## 2021-05-15 VITALS — BP 99/69 | HR 56 | Temp 97.9°F | Ht 63.0 in | Wt 199.2 lb

## 2021-05-15 DIAGNOSIS — L723 Sebaceous cyst: Secondary | ICD-10-CM

## 2021-05-15 NOTE — Progress Notes (Signed)
[facility] POST-OP OFFICE VISIT  05/15/2021  HPI: [name] is a 25 y.o. female 15 days s/p excision of left cervical cyst.  She denies any discharge.  Seems quite pleased with his healing progress.  Vital signs: BP 99/69   Pulse (!) 56   Temp 97.9 F (36.6 C) (Oral)   Ht '5\' 3"'$  (1.6 m)   Wt 199 lb 3.2 oz (90.4 kg)   SpO2 98%   BMI 35.29 kg/m    Physical Exam: Constitutional: She appears well Skin: Slightly raised at excision site, but anticipating continued healing with regression of the prominence.  Assessment/Plan: This is a 25 y.o. female 15 days s/p excision left neck dermal cyst.  Patient Active Problem List   Diagnosis Date Noted   Cesarean delivery delivered - Repeat 5/24 03/04/2018   Postpartum care following vaginal delivery 03/04/2018   Seizure-like activity ([place]) 11/30/2017   Impaired glucose tolerance test 09/23/2017   Chronic pelvic pain in female 02/01/2012    -We will be glad to see again as needed.   Ronny Bacon M.D., FACS 05/15/2021, 11:15 AM

## 2021-05-15 NOTE — Patient Instructions (Signed)
F/u as needed

## 2021-05-22 ENCOUNTER — Other Ambulatory Visit (HOSPITAL_BASED_OUTPATIENT_CLINIC_OR_DEPARTMENT_OTHER): Payer: Self-pay

## 2021-05-29 ENCOUNTER — Other Ambulatory Visit (HOSPITAL_COMMUNITY): Payer: Self-pay

## 2021-06-02 ENCOUNTER — Other Ambulatory Visit (HOSPITAL_COMMUNITY): Payer: Self-pay

## 2021-06-04 ENCOUNTER — Other Ambulatory Visit (HOSPITAL_BASED_OUTPATIENT_CLINIC_OR_DEPARTMENT_OTHER): Payer: Self-pay

## 2021-06-04 ENCOUNTER — Other Ambulatory Visit (HOSPITAL_COMMUNITY): Payer: Self-pay

## 2021-06-04 MED ORDER — ESZOPICLONE 1 MG PO TABS: ORAL_TABLET | ORAL | 0 refills | Status: DC

## 2021-06-05 ENCOUNTER — Other Ambulatory Visit (HOSPITAL_COMMUNITY): Payer: Self-pay

## 2021-06-05 MED ORDER — ESZOPICLONE 1 MG PO TABS
1.0000 mg | ORAL_TABLET | Freq: Every evening | ORAL | 0 refills | Status: DC | PRN
  Filled 2021-06-05: qty 90, 90d supply, fill #0

## 2021-06-10 ENCOUNTER — Other Ambulatory Visit (HOSPITAL_COMMUNITY): Payer: Self-pay

## 2021-06-10 DIAGNOSIS — Z79899 Other long term (current) drug therapy: Secondary | ICD-10-CM | POA: Diagnosis not present

## 2021-06-10 DIAGNOSIS — F419 Anxiety disorder, unspecified: Secondary | ICD-10-CM | POA: Diagnosis not present

## 2021-06-10 DIAGNOSIS — F902 Attention-deficit hyperactivity disorder, combined type: Secondary | ICD-10-CM | POA: Diagnosis not present

## 2021-06-10 DIAGNOSIS — R41840 Attention and concentration deficit: Secondary | ICD-10-CM | POA: Diagnosis not present

## 2021-06-10 MED ORDER — VYVANSE 20 MG PO CAPS
20.0000 mg | ORAL_CAPSULE | Freq: Every day | ORAL | 0 refills | Status: DC
  Filled 2021-06-10: qty 30, 30d supply, fill #0

## 2021-06-19 DIAGNOSIS — R41840 Attention and concentration deficit: Secondary | ICD-10-CM | POA: Diagnosis not present

## 2021-07-11 ENCOUNTER — Other Ambulatory Visit (HOSPITAL_COMMUNITY): Payer: Self-pay

## 2021-07-11 DIAGNOSIS — Z79899 Other long term (current) drug therapy: Secondary | ICD-10-CM | POA: Diagnosis not present

## 2021-07-11 DIAGNOSIS — F902 Attention-deficit hyperactivity disorder, combined type: Secondary | ICD-10-CM | POA: Diagnosis not present

## 2021-07-11 DIAGNOSIS — F419 Anxiety disorder, unspecified: Secondary | ICD-10-CM | POA: Diagnosis not present

## 2021-07-11 MED ORDER — VYVANSE 20 MG PO CAPS
20.0000 mg | ORAL_CAPSULE | Freq: Every day | ORAL | 0 refills | Status: DC
  Filled 2021-07-11: qty 30, 30d supply, fill #0

## 2021-07-16 ENCOUNTER — Other Ambulatory Visit (HOSPITAL_COMMUNITY): Payer: Self-pay

## 2021-07-16 DIAGNOSIS — F330 Major depressive disorder, recurrent, mild: Secondary | ICD-10-CM | POA: Diagnosis not present

## 2021-07-16 DIAGNOSIS — F5105 Insomnia due to other mental disorder: Secondary | ICD-10-CM | POA: Diagnosis not present

## 2021-07-16 DIAGNOSIS — F411 Generalized anxiety disorder: Secondary | ICD-10-CM | POA: Diagnosis not present

## 2021-07-16 DIAGNOSIS — F908 Attention-deficit hyperactivity disorder, other type: Secondary | ICD-10-CM | POA: Diagnosis not present

## 2021-07-16 MED ORDER — BUPROPION HCL ER (SR) 100 MG PO TB12
100.0000 mg | ORAL_TABLET | Freq: Two times a day (BID) | ORAL | 0 refills | Status: DC
  Filled 2021-07-16: qty 60, 30d supply, fill #0

## 2021-07-17 ENCOUNTER — Other Ambulatory Visit (HOSPITAL_COMMUNITY): Payer: Self-pay

## 2021-07-22 ENCOUNTER — Encounter: Payer: Self-pay | Admitting: Family Medicine

## 2021-07-28 ENCOUNTER — Encounter: Payer: Self-pay | Admitting: Family Medicine

## 2021-07-28 ENCOUNTER — Ambulatory Visit: Payer: 59 | Admitting: Family Medicine

## 2021-07-28 ENCOUNTER — Other Ambulatory Visit: Payer: Self-pay

## 2021-07-28 VITALS — BP 110/70 | HR 80 | Ht 63.0 in | Wt 198.0 lb

## 2021-07-28 DIAGNOSIS — Z111 Encounter for screening for respiratory tuberculosis: Secondary | ICD-10-CM | POA: Diagnosis not present

## 2021-07-28 NOTE — Progress Notes (Signed)
Date:  07/28/2021   Name:  [name]   DOB:  Apr 10, 1996   MRN:  [id number]   Chief Complaint: tb gold order  Patient is a 25 year old female who presents for a tb screening exam. The patient reports the following problems: none. Health maintenance has been reviewed up to date.     Lab Results  Component Value Date   CREATININE 0.74 04/28/2021   BUN 11 04/28/2021   NA 138 04/28/2021   K 4.1 04/28/2021   CL 102 04/28/2021   CO2 24 04/28/2021   Lab Results  Component Value Date   CHOL 145 04/28/2021   HDL 35 (L) 04/28/2021   LDLCALC 95 04/28/2021   TRIG 75 04/28/2021   No results found for: TSH No results found for: HGBA1C Lab Results  Component Value Date   WBC 4.8 04/28/2021   HGB 15.1 04/28/2021   HCT 44.8 04/28/2021   MCV 90 04/28/2021   PLT 173 04/28/2021   Lab Results  Component Value Date   ALT 21 04/28/2021   AST 14 04/28/2021   ALKPHOS 68 04/28/2021   BILITOT 0.5 04/28/2021     Review of Systems  Constitutional:  Negative for chills and fever.  HENT:  Negative for drooling, ear discharge, ear pain and sore throat.   Respiratory:  Negative for cough, shortness of breath and wheezing.   Cardiovascular:  Negative for chest pain, palpitations and leg swelling.  Gastrointestinal:  Negative for abdominal pain, blood in stool, constipation, diarrhea and nausea.  Endocrine: Negative for polydipsia.  Genitourinary:  Negative for dysuria, frequency, hematuria and urgency.  Musculoskeletal:  Negative for back pain, myalgias and neck pain.  Skin:  Negative for rash.  Allergic/Immunologic: Negative for environmental allergies.  Neurological:  Negative for dizziness and headaches.  Hematological:  Does not bruise/bleed easily.  Psychiatric/Behavioral:  Negative for suicidal ideas. The patient is not nervous/anxious.    Patient Active Problem List   Diagnosis Date Noted   Cesarean delivery delivered - Repeat 5/24 03/04/2018   Postpartum care  following vaginal delivery 03/04/2018   Seizure-like activity ([place]) 11/30/2017   Impaired glucose tolerance test 09/23/2017   Chronic pelvic pain in female 02/01/2012    Allergies  Allergen Reactions   Hydrocodone Hives and Shortness Of Breath   Aspirin Hives    Pt states that she can tolerate ibuprofen   Pollen Extract Other (See Comments)    Seasonal allergy   Shellfish Allergy Nausea And Vomiting   Zoloft [Sertraline Hcl]    Mold Extract [Trichophyton Mentagrophyte] Itching and Rash    Past Surgical History:  Procedure Laterality Date   CESAREAN SECTION     CESAREAN SECTION N/A 03/04/2018   Procedure: Repeat CESAREAN SECTION;  Surgeon: Charyl Bigger, MD;  Location: [facility];  Service: Obstetrics;  Laterality: N/A;  EDD: 03/10/18 Allergy: Aspirin, Shellfish   CHOLECYSTECTOMY     LAPAROSCOPY  2012   diagnose endometriosis   TONSILLECTOMY      Social History   Tobacco Use   Smoking status: Never   Smokeless tobacco: Never  Vaping Use   Vaping Use: Never used  Substance Use Topics   Alcohol use: Yes    Alcohol/week: 2.0 standard drinks    Types: 2 Glasses of wine per week   Drug use: No     Medication list has been reviewed and updated.  No outpatient medications have been marked as taking for the 07/28/21 encounter (Office Visit) with  Juline Patch, MD.    [facility] 2/9 Scores 04/28/2021 04/08/2021 01/07/2021 11/22/2020  PHQ - 2 Score 2 1 1 6   PHQ- 9 Score 6 7 4 18     GAD 7 : Generalized Anxiety Score 04/28/2021 04/08/2021 01/07/2021 11/22/2020  Nervous, Anxious, on Edge 2 2 1 3   Control/stop worrying 2 2 1 3   Worry too much - different things 2 2 1 3   Trouble relaxing 2 2 1 3   Restless 0 0 0 0  Easily annoyed or irritable 0 2 1 3   Afraid - awful might happen 0 0 0 0  Total GAD 7 Score 8 10 5 15   Anxiety Difficulty Not difficult at all Somewhat difficult Somewhat difficult Somewhat difficult    BP Readings from Last 3 Encounters:  07/28/21 110/70   05/15/21 99/69  05/01/21 115/76    Physical Exam Vitals and nursing note reviewed.  Constitutional:      General: She is not in acute distress.    Appearance: She is not diaphoretic.  HENT:     Head: Normocephalic and atraumatic.     Right Ear: Tympanic membrane, ear canal and external ear normal.     Left Ear: Tympanic membrane, ear canal and external ear normal.  Eyes:     General:        Right eye: No discharge.        Left eye: No discharge.     Conjunctiva/sclera: Conjunctivae normal.     Pupils: Pupils are equal, round, and reactive to light.  Neck:     Thyroid: No thyromegaly.     Vascular: No JVD.  Cardiovascular:     Rate and Rhythm: Normal rate and regular rhythm.     Heart sounds: Normal heart sounds. No murmur heard.   No friction rub. No gallop.  Pulmonary:     Effort: Pulmonary effort is normal.     Breath sounds: Normal breath sounds. No wheezing or rhonchi.  Abdominal:     General: Bowel sounds are normal.     Palpations: Abdomen is soft. There is no mass.     Tenderness: There is no abdominal tenderness. There is no guarding.  Musculoskeletal:     Cervical back: Normal range of motion and neck supple.  Lymphadenopathy:     Cervical: No cervical adenopathy.  Skin:    General: Skin is dry.  Neurological:     Mental Status: She is alert.    Wt Readings from Last 3 Encounters:  07/28/21 198 lb (89.8 kg)  05/15/21 199 lb 3.2 oz (90.4 kg)  05/01/21 201 lb (91.2 kg)    BP 110/70   Pulse 80   Ht 5\' 3"  (1.6 m)   Wt 198 lb (89.8 kg)   BMI 35.07 kg/m   Assessment and Plan:  1. Screening-pulmonary TB Patient with negative history for tuberculin infection.  Patient needs QuantiFERON gold plus evaluation for satisfaction of screening for tuberculosis per nursing program. - QuantiFERON-TB Gold Plus

## 2021-08-02 LAB — QUANTIFERON-TB GOLD PLUS
QuantiFERON Mitogen Value: >10 IU/mL
QuantiFERON Nil Value: 0.04 IU/mL
QuantiFERON TB1 Ag Value: 0.05 IU/mL
QuantiFERON TB2 Ag Value: 0.04 IU/mL
QuantiFERON-TB Gold Plus: NEGATIVE

## 2021-08-06 ENCOUNTER — Other Ambulatory Visit (HOSPITAL_COMMUNITY): Payer: Self-pay

## 2021-08-06 MED ORDER — VENLAFAXINE HCL ER 75 MG PO CP24
75.0000 mg | ORAL_CAPSULE | Freq: Every day | ORAL | 0 refills | Status: DC
  Filled 2021-08-06: qty 30, 30d supply, fill #0

## 2021-08-06 MED ORDER — VYVANSE 30 MG PO CHEW
1.0000 | CHEWABLE_TABLET | Freq: Every day | ORAL | 0 refills | Status: DC
  Filled 2021-08-06: qty 30, 30d supply, fill #0

## 2021-09-01 ENCOUNTER — Other Ambulatory Visit (HOSPITAL_COMMUNITY): Payer: Self-pay

## 2021-09-01 MED ORDER — VENLAFAXINE HCL ER 75 MG PO CP24
75.0000 mg | ORAL_CAPSULE | Freq: Every day | ORAL | 0 refills | Status: DC
  Filled 2021-09-01 – 2021-09-09 (×2): qty 30, 30d supply, fill #0

## 2021-09-01 MED ORDER — VYVANSE 30 MG PO CHEW
1.0000 | CHEWABLE_TABLET | Freq: Every day | ORAL | 0 refills | Status: DC
  Filled 2021-09-01 – 2021-09-09 (×2): qty 30, 30d supply, fill #0

## 2021-09-09 ENCOUNTER — Other Ambulatory Visit (HOSPITAL_COMMUNITY): Payer: Self-pay

## 2021-09-17 ENCOUNTER — Other Ambulatory Visit (HOSPITAL_COMMUNITY): Payer: Self-pay

## 2021-09-17 DIAGNOSIS — F908 Attention-deficit hyperactivity disorder, other type: Secondary | ICD-10-CM | POA: Diagnosis not present

## 2021-09-17 DIAGNOSIS — F5105 Insomnia due to other mental disorder: Secondary | ICD-10-CM | POA: Diagnosis not present

## 2021-09-17 DIAGNOSIS — F411 Generalized anxiety disorder: Secondary | ICD-10-CM | POA: Diagnosis not present

## 2021-09-17 DIAGNOSIS — F330 Major depressive disorder, recurrent, mild: Secondary | ICD-10-CM | POA: Diagnosis not present

## 2021-09-17 MED ORDER — VENLAFAXINE HCL ER 75 MG PO CP24
75.0000 mg | ORAL_CAPSULE | Freq: Every day | ORAL | 0 refills | Status: DC
  Filled 2021-09-17 – 2021-10-10 (×2): qty 90, 90d supply, fill #0

## 2021-10-10 ENCOUNTER — Other Ambulatory Visit (HOSPITAL_COMMUNITY): Payer: Self-pay

## 2021-10-10 MED ORDER — VYVANSE 30 MG PO CAPS
30.0000 mg | ORAL_CAPSULE | Freq: Every day | ORAL | 0 refills | Status: DC
  Filled 2021-10-10: qty 30, 30d supply, fill #0

## 2021-10-20 ENCOUNTER — Other Ambulatory Visit (HOSPITAL_COMMUNITY): Payer: Self-pay

## 2021-11-20 ENCOUNTER — Other Ambulatory Visit (HOSPITAL_COMMUNITY): Payer: Self-pay

## 2021-11-20 DIAGNOSIS — F330 Major depressive disorder, recurrent, mild: Secondary | ICD-10-CM | POA: Diagnosis not present

## 2021-11-20 DIAGNOSIS — F908 Attention-deficit hyperactivity disorder, other type: Secondary | ICD-10-CM | POA: Diagnosis not present

## 2021-11-20 DIAGNOSIS — F5105 Insomnia due to other mental disorder: Secondary | ICD-10-CM | POA: Diagnosis not present

## 2021-11-20 DIAGNOSIS — F411 Generalized anxiety disorder: Secondary | ICD-10-CM | POA: Diagnosis not present

## 2021-11-20 MED ORDER — VENLAFAXINE HCL ER 75 MG PO CP24
75.0000 mg | ORAL_CAPSULE | Freq: Every day | ORAL | 0 refills | Status: DC
  Filled 2021-11-20 – 2022-01-12 (×2): qty 90, 90d supply, fill #0

## 2021-11-20 MED ORDER — VYVANSE 30 MG PO CAPS
30.0000 mg | ORAL_CAPSULE | Freq: Every day | ORAL | 0 refills | Status: DC
Start: 2021-11-20 — End: 2022-10-09
  Filled 2021-11-20: qty 30, 30d supply, fill #0

## 2021-11-21 ENCOUNTER — Other Ambulatory Visit (HOSPITAL_COMMUNITY): Payer: Self-pay

## 2021-11-21 MED ORDER — AMPHETAMINE-DEXTROAMPHET ER 25 MG PO CP24
25.0000 mg | ORAL_CAPSULE | Freq: Every day | ORAL | 0 refills | Status: DC
  Filled 2021-11-21 (×2): qty 30, 30d supply, fill #0

## 2021-12-22 ENCOUNTER — Other Ambulatory Visit (HOSPITAL_COMMUNITY): Payer: Self-pay

## 2021-12-22 MED ORDER — AMPHETAMINE-DEXTROAMPHET ER 25 MG PO CP24
25.0000 mg | ORAL_CAPSULE | Freq: Every day | ORAL | 0 refills | Status: DC
  Filled 2021-12-22 (×2): qty 90, 90d supply, fill #0

## 2022-01-12 ENCOUNTER — Other Ambulatory Visit (HOSPITAL_COMMUNITY): Payer: Self-pay

## 2022-02-12 ENCOUNTER — Other Ambulatory Visit (HOSPITAL_COMMUNITY): Payer: Self-pay

## 2022-02-12 DIAGNOSIS — F330 Major depressive disorder, recurrent, mild: Secondary | ICD-10-CM | POA: Diagnosis not present

## 2022-02-12 DIAGNOSIS — F411 Generalized anxiety disorder: Secondary | ICD-10-CM | POA: Diagnosis not present

## 2022-02-12 DIAGNOSIS — F908 Attention-deficit hyperactivity disorder, other type: Secondary | ICD-10-CM | POA: Diagnosis not present

## 2022-02-12 DIAGNOSIS — F5105 Insomnia due to other mental disorder: Secondary | ICD-10-CM | POA: Diagnosis not present

## 2022-02-12 MED ORDER — VENLAFAXINE HCL ER 75 MG PO CP24
75.0000 mg | ORAL_CAPSULE | Freq: Every day | ORAL | 0 refills | Status: DC
  Filled 2022-02-12 – 2022-04-16 (×2): qty 90, 90d supply, fill #0

## 2022-02-12 MED ORDER — AMPHETAMINE-DEXTROAMPHET ER 20 MG PO CP24
40.0000 mg | ORAL_CAPSULE | Freq: Every morning | ORAL | 0 refills | Status: DC
Start: 2022-02-12 — End: 2022-03-05
  Filled 2022-02-12 – 2022-02-18 (×2): qty 60, 30d supply, fill #0

## 2022-02-12 MED ORDER — ESZOPICLONE 1 MG PO TABS
1.0000 mg | ORAL_TABLET | Freq: Every evening | ORAL | 0 refills | Status: DC | PRN
Start: 2022-02-12 — End: 2023-01-16
  Filled 2022-02-12: qty 90, 90d supply, fill #0

## 2022-02-18 ENCOUNTER — Other Ambulatory Visit (HOSPITAL_COMMUNITY): Payer: Self-pay

## 2022-03-05 ENCOUNTER — Other Ambulatory Visit (HOSPITAL_COMMUNITY): Payer: Self-pay

## 2022-03-05 MED ORDER — AMPHETAMINE-DEXTROAMPHET ER 20 MG PO CP24
40.0000 mg | ORAL_CAPSULE | Freq: Every day | ORAL | 0 refills | Status: DC
  Filled 2022-03-23: qty 60, 30d supply, fill #0
  Filled 2022-05-13: qty 180, 90d supply, fill #0

## 2022-03-23 ENCOUNTER — Other Ambulatory Visit (HOSPITAL_COMMUNITY): Payer: Self-pay

## 2022-04-16 ENCOUNTER — Other Ambulatory Visit (HOSPITAL_COMMUNITY): Payer: Self-pay

## 2022-04-18 ENCOUNTER — Other Ambulatory Visit (HOSPITAL_COMMUNITY): Payer: Self-pay

## 2022-04-30 ENCOUNTER — Encounter: Payer: 59 | Admitting: Family Medicine

## 2022-05-12 ENCOUNTER — Encounter: Payer: Self-pay | Admitting: Family Medicine

## 2022-05-12 ENCOUNTER — Ambulatory Visit (INDEPENDENT_AMBULATORY_CARE_PROVIDER_SITE_OTHER): Payer: 59 | Admitting: Family Medicine

## 2022-05-12 VITALS — BP 110/60 | HR 64 | Ht 63.0 in | Wt 185.0 lb

## 2022-05-12 DIAGNOSIS — R634 Abnormal weight loss: Secondary | ICD-10-CM

## 2022-05-12 DIAGNOSIS — R7309 Other abnormal glucose: Secondary | ICD-10-CM

## 2022-05-12 DIAGNOSIS — Z0000 Encounter for general adult medical examination without abnormal findings: Secondary | ICD-10-CM

## 2022-05-12 NOTE — Progress Notes (Signed)
Date:  05/12/2022   Name:  [name]   DOB:  May 06, 1996   MRN:  [id number]   Chief Complaint: Annual Exam  Patient is a 26 year old female who presents for a comprehensive physical exam. The patient reports the following problems: none. Health maintenance has been reviewed up to date.      Lab Results  Component Value Date   NA 138 04/28/2021   K 4.1 04/28/2021   CO2 24 04/28/2021   GLUCOSE 85 04/28/2021   BUN 11 04/28/2021   CREATININE 0.74 04/28/2021   CALCIUM 9.6 04/28/2021   EGFR 114 04/28/2021   GFRNONAA 129 05/01/2020   Lab Results  Component Value Date   CHOL 145 04/28/2021   HDL 35 (L) 04/28/2021   LDLCALC 95 04/28/2021   TRIG 75 04/28/2021   No results found for: "TSH" No results found for: "HGBA1C" Lab Results  Component Value Date   WBC 4.8 04/28/2021   HGB 15.1 04/28/2021   HCT 44.8 04/28/2021   MCV 90 04/28/2021   PLT 173 04/28/2021   Lab Results  Component Value Date   ALT 21 04/28/2021   AST 14 04/28/2021   ALKPHOS 68 04/28/2021   BILITOT 0.5 04/28/2021   No results found for: "25OHVITD2", "25OHVITD3", "VD25OH"   Review of Systems  Constitutional: Negative.  Negative for chills, fatigue, fever and unexpected weight change.  HENT:  Negative for congestion, ear discharge, ear pain, rhinorrhea, sinus pressure, sneezing and sore throat.   Eyes:  Negative for visual disturbance.  Respiratory:  Negative for cough, choking, chest tightness, shortness of breath, wheezing and stridor.   Cardiovascular:  Negative for chest pain, palpitations and leg swelling.  Gastrointestinal:  Negative for abdominal distention, abdominal pain, anal bleeding, blood in stool, constipation, diarrhea, nausea and rectal pain.  Genitourinary:  Negative for dysuria, flank pain, frequency, hematuria, menstrual problem, urgency, vaginal bleeding and vaginal discharge.  Musculoskeletal:  Negative for arthralgias, back pain, myalgias and neck stiffness.  Skin:   Negative for color change, pallor and rash.  Neurological:  Negative for dizziness, weakness, light-headedness and headaches.  Hematological:  Negative for adenopathy. Does not bruise/bleed easily.  Psychiatric/Behavioral:  Negative for dysphoric mood. The patient is not nervous/anxious.     Patient Active Problem List   Diagnosis Date Noted   Cesarean delivery delivered - Repeat 5/24 03/04/2018   Postpartum care following vaginal delivery 03/04/2018   Seizure-like activity ([place]) 11/30/2017   Impaired glucose tolerance test 09/23/2017   Chronic pelvic pain in female 02/01/2012    Allergies  Allergen Reactions   Hydrocodone Hives and Shortness Of Breath   Aspirin Hives    Pt states that she can tolerate ibuprofen   Pollen Extract Other (See Comments)    Seasonal allergy   Shellfish Allergy Nausea And Vomiting   Zoloft [Sertraline Hcl]    Mold Extract [Trichophyton Mentagrophyte] Itching and Rash    Past Surgical History:  Procedure Laterality Date   CESAREAN SECTION     CESAREAN SECTION N/A 03/04/2018   Procedure: Repeat CESAREAN SECTION;  Surgeon: Charyl Bigger, MD;  Location: [place];  Service: Obstetrics;  Laterality: N/A;  EDD: 03/10/18 Allergy: Aspirin, Shellfish   CHOLECYSTECTOMY     LAPAROSCOPY  2012   diagnose endometriosis   TONSILLECTOMY      Social History   Tobacco Use   Smoking status: Never   Smokeless tobacco: Never  Vaping Use   Vaping Use: Never used  Substance  Use Topics   Alcohol use: Yes    Alcohol/week: 2.0 standard drinks of alcohol    Types: 2 Glasses of wine per week   Drug use: No     Medication list has been reviewed and updated.  Current Meds  Medication Sig   amphetamine-dextroamphetamine (ADDERALL XR) 20 MG 24 hr capsule Take 2 capsules (40 mg total) by mouth daily.   cyanocobalamin 2000 MCG tablet Take 2,000 mcg by mouth daily.   eszopiclone (LUNESTA) 1 MG TABS tablet Take 1 tablet (1 mg total) by mouth at bedtime  as needed.   venlafaxine XR (EFFEXOR XR) 75 MG 24 hr capsule Take 1 capsule (75 mg total) by mouth daily with breakfast.       05/12/2022    9:46 AM 04/28/2021    8:50 AM 04/08/2021   10:16 AM 01/07/2021    8:37 AM  GAD 7 : Generalized Anxiety Score  Nervous, Anxious, on Edge _0 Control/stop worrying _1 Worry too much - different things _2 Trouble relaxing _3 Restless 1 0 0 0  Easily annoyed or irritable 1 0 2 1  Afraid - awful might happen 0 0 0 0  Total GAD 7 Score _4 Anxiety Difficulty Somewhat difficult Not difficult at all Somewhat difficult Somewhat difficult       05/12/2022    9:46 AM 04/28/2021    8:50 AM 04/08/2021   10:15 AM  Depression screen PHQ 2/9  Decreased Interest 1 1 0  Down, Depressed, Hopeless _5 PHQ - 2 Score _6 Altered sleeping 2 0 2  Tired, decreased energy _7 Change in appetite 0 0 0  Feeling bad or failure about yourself  0 0 0  Trouble concentrating _8 Moving slowly or fidgety/restless 1 0 0  Suicidal thoughts 0 0 0  PHQ-9 Score _9 Difficult doing work/chores Not difficult at all Somewhat difficult Not difficult at all    BP Readings from Last 3 Encounters:  05/12/22 110/60  07/28/21 110/70  05/15/21 99/69    Physical Exam Vitals and nursing note reviewed. Exam conducted with a chaperone present.  Constitutional:      General: She is not in acute distress.    Appearance: Normal appearance. She is normal weight. She is not diaphoretic.  HENT:     Head: Normocephalic and atraumatic.     Jaw: There is normal jaw occlusion.     Right Ear: Hearing, tympanic membrane, ear canal and external ear normal.     Left Ear: Hearing, tympanic membrane, ear canal and external ear normal.     Nose: Nose normal.     Mouth/Throat:     Lips: Pink.     Mouth: Mucous membranes are moist.     Dentition: Normal dentition.     Tongue: No lesions.     Palate: No mass.     Pharynx: Oropharynx is clear. Uvula  midline. No pharyngeal swelling, oropharyngeal exudate, posterior oropharyngeal erythema or uvula swelling.  Eyes:     General: Lids are normal. Vision grossly intact. Gaze aligned appropriately.        Right eye: No discharge.        Left eye: No discharge.     Extraocular Movements: Extraocular movements intact.     Conjunctiva/sclera: Conjunctivae normal.     Pupils:  Pupils are equal, round, and reactive to light.     Funduscopic exam:    Right eye: Red reflex present.        Left eye: Red reflex present. Neck:     Thyroid: No thyroid mass, thyromegaly or thyroid tenderness.     Vascular: Normal carotid pulses. No carotid bruit, hepatojugular reflux or JVD.     Trachea: Trachea and phonation normal.  Cardiovascular:     Rate and Rhythm: Normal rate and regular rhythm.     Chest Wall: PMI is not displaced.     Pulses: Normal pulses.          Carotid pulses are 2+ on the right side and 2+ on the left side.      Radial pulses are 2+ on the right side and 2+ on the left side.       Femoral pulses are 2+ on the right side and 2+ on the left side.      Popliteal pulses are 2+ on the right side and 2+ on the left side.       Dorsalis pedis pulses are 2+ on the right side and 2+ on the left side.       Posterior tibial pulses are 2+ on the right side and 2+ on the left side.     Heart sounds: Normal heart sounds, S1 normal and S2 normal. No murmur heard.    No systolic murmur is present.     No diastolic murmur is present.     No friction rub. No gallop. No S3 or S4 sounds.  Pulmonary:     Effort: Pulmonary effort is normal.     Breath sounds: Normal breath sounds. No decreased breath sounds, wheezing, rhonchi or rales.  Abdominal:     General: Bowel sounds are normal.     Palpations: Abdomen is soft. There is no hepatomegaly, splenomegaly or mass.     Tenderness: There is no abdominal tenderness. There is no guarding.  Musculoskeletal:        General: Normal range of motion.      Cervical back: Full passive range of motion without pain, normal range of motion and neck supple.     Right lower leg: No edema.     Left lower leg: No edema.  Lymphadenopathy:     Head:     Right side of head: No submental or submandibular adenopathy.     Left side of head: No submental or submandibular adenopathy.     Cervical: No cervical adenopathy.     Right cervical: No superficial, deep or posterior cervical adenopathy.    Left cervical: No superficial, deep or posterior cervical adenopathy.     Upper Body:     Right upper body: No supraclavicular or axillary adenopathy.     Left upper body: No supraclavicular or axillary adenopathy.     Lower Body: No right inguinal adenopathy. No left inguinal adenopathy.  Skin:    General: Skin is warm and dry.  Neurological:     Mental Status: She is alert.     Cranial Nerves: Cranial nerves 2-12 are intact.     Sensory: Sensation is intact.     Motor: Motor function is intact.     Deep Tendon Reflexes: Reflexes are normal and symmetric.  Psychiatric:        Behavior: Behavior is cooperative.     Wt Readings from Last 3 Encounters:  05/12/22 185 lb (83.9 kg)  07/28/21 198 lb (89.8  kg)  05/15/21 199 lb 3.2 oz (90.4 kg)    BP 110/60   Pulse 64   Ht _0  (1.6 m)   Wt 185 lb (83.9 kg)   BMI 32.77 kg/m   Assessment and Plan:  [name] is a 26 y.o. female who presents today for her Complete Annual Exam. She feels well. She reports exercising gym. She reports she is sleeping fairly well.   1. Annual physical exam No subjective/objective concerns noted during HPI, review of past medical history, review of systems, or physical exam.  We will look to obtain CMP lipid panel and CBC. - Comprehensive Metabolic Panel (CMET) - Lipid Panel With LDL/HDL Ratio - CBC w/Diff/Platelet  2. Weight loss Patient has noted some weight loss but she feels as if this is due to her trying to lose weight.  We will check A1c to rule out any  possibility of diabetes underlying or if there is a thyroid issue. - Hemoglobin A1c - Thyroid Panel With TSH  3. Impaired glucose tolerance test During previous pregnancy patient has impaired glucose tolerance test and we will check A1c for any further evolution to prediabetes or even frank diabetes. - Hemoglobin A1c

## 2022-05-13 ENCOUNTER — Other Ambulatory Visit (HOSPITAL_COMMUNITY): Payer: Self-pay

## 2022-05-13 DIAGNOSIS — F5105 Insomnia due to other mental disorder: Secondary | ICD-10-CM | POA: Diagnosis not present

## 2022-05-13 DIAGNOSIS — F908 Attention-deficit hyperactivity disorder, other type: Secondary | ICD-10-CM | POA: Diagnosis not present

## 2022-05-13 DIAGNOSIS — F411 Generalized anxiety disorder: Secondary | ICD-10-CM | POA: Diagnosis not present

## 2022-05-13 DIAGNOSIS — F330 Major depressive disorder, recurrent, mild: Secondary | ICD-10-CM | POA: Diagnosis not present

## 2022-05-13 LAB — CBC WITH DIFFERENTIAL/PLATELET
Basophils Absolute: 0 10*3/uL (ref 0.0–0.2)
Basos: 1 %
EOS (ABSOLUTE): 0.1 10*3/uL (ref 0.0–0.4)
Eos: 2 %
Hematocrit: 43 % (ref 34.0–46.6)
Hemoglobin: 14.6 g/dL (ref 11.1–15.9)
Immature Grans (Abs): 0 10*3/uL (ref 0.0–0.1)
Immature Granulocytes: 1 %
Lymphocytes Absolute: 1.3 10*3/uL (ref 0.7–3.1)
Lymphs: 30 %
MCH: 30.7 pg (ref 26.6–33.0)
MCHC: 34 g/dL (ref 31.5–35.7)
MCV: 90 fL (ref 79–97)
Monocytes Absolute: 0.4 10*3/uL (ref 0.1–0.9)
Monocytes: 9 %
Neutrophils Absolute: 2.4 10*3/uL (ref 1.4–7.0)
Neutrophils: 57 %
Platelets: 192 10*3/uL (ref 150–450)
RBC: 4.76 x10E6/uL (ref 3.77–5.28)
RDW: 11.8 % (ref 11.7–15.4)
WBC: 4.1 10*3/uL (ref 3.4–10.8)

## 2022-05-13 LAB — COMPREHENSIVE METABOLIC PANEL
ALT: 16 IU/L (ref 0–32)
AST: 15 IU/L (ref 0–40)
Albumin/Globulin Ratio: 1.9 (ref 1.2–2.2)
Albumin: 4.3 g/dL (ref 4.0–5.0)
Alkaline Phosphatase: 70 IU/L (ref 44–121)
BUN/Creatinine Ratio: 20 (ref 9–23)
BUN: 12 mg/dL (ref 6–20)
Bilirubin Total: 0.5 mg/dL (ref 0.0–1.2)
CO2: 22 mmol/L (ref 20–29)
Calcium: 9.1 mg/dL (ref 8.7–10.2)
Chloride: 104 mmol/L (ref 96–106)
Creatinine, Ser: 0.6 mg/dL (ref 0.57–1.00)
Globulin, Total: 2.3 g/dL (ref 1.5–4.5)
Glucose: 84 mg/dL (ref 70–99)
Potassium: 4 mmol/L (ref 3.5–5.2)
Sodium: 139 mmol/L (ref 134–144)
Total Protein: 6.6 g/dL (ref 6.0–8.5)
eGFR: 126 mL/min/{1.73_m2} (ref >59)

## 2022-05-13 LAB — LIPID PANEL WITH LDL/HDL RATIO
Cholesterol, Total: 164 mg/dL (ref 100–199)
HDL: 48 mg/dL (ref >39)
LDL Chol Calc (NIH): 105 mg/dL — ABNORMAL HIGH (ref 0–99)
LDL/HDL Ratio: 2.2 ratio (ref 0.0–3.2)
Triglycerides: 52 mg/dL (ref 0–149)
VLDL Cholesterol Cal: 11 mg/dL (ref 5–40)

## 2022-05-13 LAB — HEMOGLOBIN A1C
Est. average glucose Bld gHb Est-mCnc: 97 mg/dL
Hgb A1c MFr Bld: 5 % (ref 4.8–5.6)

## 2022-05-13 LAB — THYROID PANEL WITH TSH
Free Thyroxine Index: 1.9 (ref 1.2–4.9)
T3 Uptake Ratio: 25 % (ref 24–39)
T4, Total: 7.4 ug/dL (ref 4.5–12.0)
TSH: 2.31 u[IU]/mL (ref 0.450–4.500)

## 2022-05-21 ENCOUNTER — Other Ambulatory Visit: Payer: 59

## 2022-05-21 ENCOUNTER — Ambulatory Visit (INDEPENDENT_AMBULATORY_CARE_PROVIDER_SITE_OTHER): Payer: 59

## 2022-05-21 ENCOUNTER — Ambulatory Visit
Admission: RE | Admit: 2022-05-21 | Discharge: 2022-05-21 | Disposition: A | Discharge status: Home / Self Care | Payer: 59 | Source: Ambulatory Visit | Attending: Emergency Medicine | Admitting: Emergency Medicine

## 2022-05-21 DIAGNOSIS — M79641 Pain in right hand: Secondary | ICD-10-CM | POA: Diagnosis not present

## 2022-05-21 NOTE — ED Provider Notes (Addendum)
[name]    CSN: [id number] Arrival date & time: 05/21/22  1352      History   Chief Complaint Chief Complaint  Patient presents with   Hand Problem    Right hand injury. Pain and swelling - Entered by patient    HPI [name] is a 26 y.o. female.   Patient presents with right hand pain and swelling predominately into the base of the thumb beginning 1 day ago after fall.  Endorses that she was playing kickball recreationally when she fell landing directly over the hand.  Swelling worsened this morning.  Range of motion is intact but elicits pain.  Has attempted use of ice and 100 mg of ibuprofen which have been minimally effective helpful.  Denies numbness, tingling.  Past Medical History:  Diagnosis Date   Endometriosis    Gestational diabetes    diet controlled   Otitis media    Seizures [facility])     Patient Active Problem List   Diagnosis Date Noted   Cesarean delivery delivered - Repeat 5/24 03/04/2018   Postpartum care following vaginal delivery 03/04/2018   Seizure-like activity ([place]) 11/30/2017   Impaired glucose tolerance test 09/23/2017   Chronic pelvic pain in female 02/01/2012    Past Surgical History:  Procedure Laterality Date   CESAREAN SECTION     CESAREAN SECTION N/A 03/04/2018   Procedure: Repeat CESAREAN SECTION;  Surgeon: Charyl Bigger, MD;  Location: [facility];  Service: Obstetrics;  Laterality: N/A;  EDD: 03/10/18 Allergy: Aspirin, Shellfish   CHOLECYSTECTOMY     LAPAROSCOPY  2012   diagnose endometriosis   TONSILLECTOMY      OB History     Gravida  3   Para  2   Term  2   Preterm      AB  1   Living  2      SAB  1   IAB      Ectopic      Multiple  0   Live Births  2            Home Medications    Prior to Admission medications   Medication Sig Start Date End Date Taking? Authorizing Provider  amphetamine-dextroamphetamine (ADDERALL XR) 20 MG 24 hr capsule Take 2 capsules (40 mg  total) by mouth daily. 03/05/22     cyanocobalamin 2000 MCG tablet Take 2,000 mcg by mouth daily.    [provider]  eszopiclone (LUNESTA) 1 MG TABS tablet Take 1 at bedtime as needed 05/02/21     eszopiclone (LUNESTA) 1 MG TABS tablet Take 1 at bedtime as needed 06/04/21     eszopiclone (LUNESTA) 1 MG TABS tablet Take 1 tablet (1 mg total) by mouth at bedtime as needed. 06/05/21     eszopiclone (LUNESTA) 1 MG TABS tablet Take 1 tablet (1 mg total) by mouth at bedtime as needed. 02/12/22     lisdexamfetamine (VYVANSE) 20 MG capsule Take 1 capsule (20 mg total) by mouth daily. 07/11/21     lisdexamfetamine (VYVANSE) 30 MG capsule Take 1 capsule (30 mg total) by mouth daily. 11/20/21     Lisdexamfetamine Dimesylate (VYVANSE) 30 MG CHEW Chew 1 tablet by mouth daily. 09/01/21     venlafaxine XR (EFFEXOR XR) 75 MG 24 hr capsule Take 1 capsule (75 mg total) by mouth daily with breakfast. 02/12/22     FLUoxetine (PROZAC) 20 MG capsule Take 1 capsule (20 mg total) by mouth daily. 01/07/21  03/17/21  Juline Patch, MD  promethazine (PHENERGAN) 12.5 MG tablet Take 12.5 mg by mouth every 6 (six) hours as needed for nausea or vomiting.  04/17/19  [provider]  simethicone (MYLICON) 80 MG chewable tablet Chew 1 tablet (80 mg total) by mouth as needed for flatulence. 03/06/18 04/17/19  Juliene Pina, CNM    Family History Family History  Problem Relation Age of Onset   Healthy Mother    Diabetes Mother    Hearing loss Mother    Healthy Father     Social History Social History   Tobacco Use   Smoking status: Never   Smokeless tobacco: Never  Vaping Use   Vaping Use: Never used  Substance Use Topics   Alcohol use: Yes    Alcohol/week: 2.0 standard drinks of alcohol    Types: 2 Glasses of wine per week   Drug use: No     Allergies   Hydrocodone, Aspirin, Pollen extract, Shellfish allergy, Zoloft [sertraline hcl], and Mold extract [trichophyton mentagrophyte]   Review of  Systems Review of Systems  Constitutional: Negative.   Respiratory: Negative.    Cardiovascular: Negative.   Musculoskeletal:  Positive for joint swelling. Negative for arthralgias, back pain, gait problem, myalgias, neck pain and neck stiffness.  Skin: Negative.      Physical Exam Triage Vital Signs ED Triage Vitals [05/21/22 1410]  Enc Vitals Group     BP      Pulse      Resp      Temp      Temp src      SpO2      Weight      Height      Head Circumference      Peak Flow      Pain Score 8     Pain Loc      Pain Edu?      Excl. in [place]?    No data found.  Updated Vital Signs There were no vitals taken for this visit.  Visual Acuity Right Eye Distance:   Left Eye Distance:   Bilateral Distance:    Right Eye Near:   Left Eye Near:    Bilateral Near:     Physical Exam Constitutional:      Appearance: Normal appearance.  HENT:     Head: Normocephalic.  Eyes:     Extraocular Movements: Extraocular movements intact.  Pulmonary:     Effort: Pulmonary effort is normal.  Skin:    Comments: Tenderness, mild swelling and mild ecchymosis present over the base of the right thumb and the scaphoid bone, range of motion of the thumb is intact but pain is elicited with flexion, sensation is intact, capillary refill less than 3, 2+ radial pulse  Neurological:     Mental Status: She is alert and oriented to person, place, and time.  Psychiatric:        Mood and Affect: Mood normal.        Behavior: Behavior normal.      UC Treatments / Results  Labs (all labs ordered are listed, but only abnormal results are displayed) Labs Reviewed - No data to display  EKG   Radiology DG Hand Complete Right  Result Date: 05/21/2022 CLINICAL DATA:  Right hand pain after falling on hand playing kickball one day ago. First metacarpal pain. EXAM: RIGHT HAND - COMPLETE 3+ VIEW COMPARISON:  None Available. FINDINGS: Normal bone mineralization. Joint spaces are preserved. No acute  fracture  is seen. No dislocation. IMPRESSION: Normal right hand radiographs. Electronically Signed   By: Yvonne Kendall M.D.   On: 05/21/2022 14:26    Procedures Procedures (including critical care time)  Medications Ordered in UC Medications - No data to display  Initial Impression / Assessment and Plan / UC Course  I have reviewed the triage vital signs and the nursing notes.  Pertinent labs & imaging results that were available during my care of the patient were reviewed by me and considered in my medical decision making (see chart for details).  Right hand pain  Patient is stable and in no signs of distress, x-ray negative, discussed findings with patient, recommended continued use of supportive measures with walker referral given to orthopedics if symptoms persist past 2 weeks Final Clinical Impressions(s) / UC Diagnoses   Final diagnoses:  Right hand pain     Discharge Instructions      Your x-ray today did not show injury to the bone of your hand. Your pain is most likely being caused by irritation to the soft tissues, this should improve as time progresses.   You may apply heat or ice, whichever makes you feel better, to affected area in 15 minute intervals  You may continue activity as tolerated, there is no injury therefore, it is important that you continue to move around so you do not loose strength to the area  For the first 2-3 days you may wrap hand with ace wrap for additional support while completing activities, once wrapped if you begin to experience numbness or tingling it is too tight, remove and redo, you should be able to easily fit one finger under wrap   If symptoms persist past 2 weeks, you may follow up at urgent care or with orthopedic specialist for evaluation, an orthopedic doctor specializes in the bone, they may provide  management such as but not limited to imaging, long term medications and physical therapy    ED Prescriptions   None    PDMP  not reviewed this encounter.   Hans Eden, NP 05/21/22 1436    Hans Eden, NP 05/21/22 1445

## 2022-05-21 NOTE — ED Triage Notes (Signed)
Patient presents to Urgent Care with complaints of right hand injury from [place] game. She states she fell and landed on her hand. Took ibuprofen at 1200.

## 2022-05-21 NOTE — Discharge Instructions (Signed)
Your x-ray today did not show injury to the bone of your hand. Your pain is most likely being caused by irritation to the soft tissues, this should improve as time progresses.   You may apply heat or ice, whichever makes you feel better, to affected area in 15 minute intervals  You may continue activity as tolerated, there is no injury therefore, it is important that you continue to move around so you do not loose strength to the area  For the first 2-3 days you may wrap hand with ace wrap for additional support while completing activities, once wrapped if you begin to experience numbness or tingling it is too tight, remove and redo, you should be able to easily fit one finger under wrap   If symptoms persist past 2 weeks, you may follow up at urgent care or with orthopedic specialist for evaluation, an orthopedic doctor specializes in the bone, they may provide  management such as but not limited to imaging, long term medications and physical therapy

## 2022-06-01 DIAGNOSIS — Z01419 Encounter for gynecological examination (general) (routine) without abnormal findings: Secondary | ICD-10-CM | POA: Diagnosis not present

## 2022-06-01 DIAGNOSIS — T8389XD Other specified complication of genitourinary prosthetic devices, implants and grafts, subsequent encounter: Secondary | ICD-10-CM | POA: Diagnosis not present

## 2022-06-01 DIAGNOSIS — Z6831 Body mass index (BMI) 31.0-31.9, adult: Secondary | ICD-10-CM | POA: Diagnosis not present

## 2022-08-11 ENCOUNTER — Other Ambulatory Visit (HOSPITAL_COMMUNITY): Payer: Self-pay

## 2022-08-11 DIAGNOSIS — F411 Generalized anxiety disorder: Secondary | ICD-10-CM | POA: Diagnosis not present

## 2022-08-11 DIAGNOSIS — F908 Attention-deficit hyperactivity disorder, other type: Secondary | ICD-10-CM | POA: Diagnosis not present

## 2022-08-11 DIAGNOSIS — F330 Major depressive disorder, recurrent, mild: Secondary | ICD-10-CM | POA: Diagnosis not present

## 2022-08-11 DIAGNOSIS — F5105 Insomnia due to other mental disorder: Secondary | ICD-10-CM | POA: Diagnosis not present

## 2022-08-11 MED ORDER — VENLAFAXINE HCL ER 75 MG PO CP24
75.0000 mg | ORAL_CAPSULE | Freq: Every day | ORAL | 0 refills | Status: DC
  Filled 2022-08-11 – 2022-10-14 (×2): qty 90, 90d supply, fill #0

## 2022-08-19 ENCOUNTER — Other Ambulatory Visit (HOSPITAL_COMMUNITY): Payer: Self-pay

## 2022-10-09 ENCOUNTER — Ambulatory Visit: Payer: 59 | Admitting: Family Medicine

## 2022-10-09 ENCOUNTER — Encounter: Payer: Self-pay | Admitting: Family Medicine

## 2022-10-09 VITALS — BP 104/60 | HR 92 | Temp 98.3°F | Ht 63.0 in | Wt 179.0 lb

## 2022-10-09 DIAGNOSIS — R509 Fever, unspecified: Secondary | ICD-10-CM

## 2022-10-09 DIAGNOSIS — J101 Influenza due to other identified influenza virus with other respiratory manifestations: Secondary | ICD-10-CM

## 2022-10-09 DIAGNOSIS — R051 Acute cough: Secondary | ICD-10-CM | POA: Diagnosis not present

## 2022-10-09 DIAGNOSIS — R6889 Other general symptoms and signs: Secondary | ICD-10-CM

## 2022-10-09 LAB — POCT INFLUENZA A/B
Influenza A, POC: NEGATIVE
Influenza B, POC: POSITIVE — AB

## 2022-10-09 MED ORDER — OSELTAMIVIR PHOSPHATE 75 MG PO CAPS: 75.0000 mg | ORAL_CAPSULE | Freq: Two times a day (BID) | ORAL | 0 refills | Status: DC

## 2022-10-09 MED ORDER — PROMETHAZINE-DM 6.25-15 MG/5ML PO SYRP: 5.0000 mL | ORAL_SOLUTION | Freq: Four times a day (QID) | ORAL | 0 refills | Status: DC | PRN

## 2022-10-09 NOTE — Progress Notes (Signed)
  Date:  10/09/2022   Name:  [name]   DOB:  05/20/1996   MRN:  [id number]   Chief Complaint: Cough (Fever, started 3 days ago. Body aches, chills )  Cough This is a new problem. The current episode started yesterday. The problem has been gradually improving. The cough is Non-productive. Associated symptoms include chills, a fever, headaches, myalgias, nasal congestion, postnasal drip, rhinorrhea, a sore throat and sweats. Pertinent negatives include no chest pain, ear pain, hemoptysis, rash, shortness of breath or wheezing. The symptoms are aggravated by lying down. She has tried nothing for the symptoms.  Fever  This is a chronic problem. The current episode started in the past 7 days. The problem occurs intermittently. Associated symptoms include congestion, coughing, headaches and a sore throat. Pertinent negatives include no abdominal pain, chest pain, ear pain, muscle aches, rash or wheezing. The treatment provided moderate relief.    Lab Results  Component Value Date   NA 139 05/12/2022   K 4.0 05/12/2022   CO2 22 05/12/2022   GLUCOSE 84 05/12/2022   BUN 12 05/12/2022   CREATININE 0.60 05/12/2022   CALCIUM 9.1 05/12/2022   EGFR 126 05/12/2022   GFRNONAA 129 05/01/2020   Lab Results  Component Value Date   CHOL 164 05/12/2022   HDL 48 05/12/2022   LDLCALC 105 (H) 05/12/2022   TRIG 52 05/12/2022   Lab Results  Component Value Date   TSH 2.310 05/12/2022   Lab Results  Component Value Date   HGBA1C 5.0 05/12/2022   Lab Results  Component Value Date   WBC 4.1 05/12/2022   HGB 14.6 05/12/2022   HCT 43.0 05/12/2022   MCV 90 05/12/2022   PLT 192 05/12/2022   Lab Results  Component Value Date   ALT 16 05/12/2022   AST 15 05/12/2022   ALKPHOS 70 05/12/2022   BILITOT 0.5 05/12/2022   No results found for: "25OHVITD2", "25OHVITD3", "VD25OH"   Review of Systems  Constitutional:  Positive for chills, fatigue and fever.  HENT:  Positive for congestion,  postnasal drip, rhinorrhea, sneezing and sore throat. Negative for ear pain, sinus pressure and sinus pain.   Respiratory:  Positive for cough. Negative for hemoptysis, chest tightness, shortness of breath and wheezing.   Cardiovascular:  Negative for chest pain, palpitations and leg swelling.  Gastrointestinal:  Negative for abdominal pain.  Musculoskeletal:  Positive for myalgias.  Skin:  Negative for rash.  Neurological:  Positive for headaches.    Patient Active Problem List   Diagnosis Date Noted   Cesarean delivery delivered - Repeat 5/24 03/04/2018   Postpartum care following vaginal delivery 03/04/2018   Seizure-like activity (HCC) 11/30/2017   Impaired glucose tolerance test 09/23/2017   Chronic pelvic pain in female 02/01/2012    Allergies  Allergen Reactions   Hydrocodone Hives and Shortness Of Breath   Aspirin Hives    Pt states that she can tolerate ibuprofen   Pollen Extract Other (See Comments)    Seasonal allergy   Shellfish Allergy Nausea And Vomiting   Zoloft [Sertraline Hcl]    Mold Extract [Trichophyton Mentagrophyte] Itching and Rash    Past Surgical History:  Procedure Laterality Date   CESAREAN SECTION     CESAREAN SECTION N/A 03/04/2018   Procedure: Repeat CESAREAN SECTION;  Surgeon: Almquist, Susan E, MD;  Location: [facility];  Service: Obstetrics;  Laterality: N/A;  EDD: 03/10/18 Allergy: Aspirin, Shellfish   CHOLECYSTECTOMY     LAPAROSCOPY  2012     diagnose endometriosis   TONSILLECTOMY      Social History   Tobacco Use   Smoking status: Never   Smokeless tobacco: Never  Vaping Use   Vaping Use: Never used  Substance Use Topics   Alcohol use: Yes    Alcohol/week: 2.0 standard drinks of alcohol    Types: 2 Glasses of wine per week   Drug use: No     Medication list has been reviewed and updated.  Current Meds  Medication Sig   amphetamine-dextroamphetamine (ADDERALL XR) 20 MG 24 hr capsule Take 2 capsules (40 mg total) by  mouth daily.   cyanocobalamin 2000 MCG tablet Take 2,000 mcg by mouth daily.   eszopiclone (LUNESTA) 1 MG TABS tablet Take 1 tablet (1 mg total) by mouth at bedtime as needed.   venlafaxine XR (EFFEXOR XR) 75 MG 24 hr capsule Take 1 capsule (75 mg total) by mouth daily with breakfast       05/12/2022    9:46 AM 04/28/2021    8:50 AM 04/08/2021   10:16 AM 01/07/2021    8:37 AM  GAD 7 : Generalized Anxiety Score  Nervous, Anxious, on Edge 2 2 2 1  Control/stop worrying 2 2 2 1  Worry too much - different things 2 2 2 1  Trouble relaxing 2 2 2 1  Restless 1 0 0 0  Easily annoyed or irritable 1 0 2 1  Afraid - awful might happen 0 0 0 0  Total GAD 7 Score 10 8 10 5  Anxiety Difficulty Somewhat difficult Not difficult at all Somewhat difficult Somewhat difficult       05/12/2022    9:46 AM 04/28/2021    8:50 AM 04/08/2021   10:15 AM  Depression screen PHQ 2/9  Decreased Interest 1 1 0  Down, Depressed, Hopeless 1 1 1  PHQ - 2 Score 2 2 1  Altered sleeping 2 0 2  Tired, decreased energy 1 2 2  Change in appetite 0 0 0  Feeling bad or failure about yourself  0 0 0  Trouble concentrating 2 2 2  Moving slowly or fidgety/restless 1 0 0  Suicidal thoughts 0 0 0  PHQ-9 Score 8 6 7  Difficult doing work/chores Not difficult at all Somewhat difficult Not difficult at all    BP Readings from Last 3 Encounters:  10/09/22 104/60  05/12/22 110/60  07/28/21 110/70    Physical Exam Vitals and nursing note reviewed. Exam conducted with a chaperone present.  Constitutional:      General: She is not in acute distress.    Appearance: She is not diaphoretic.  HENT:     Head: Normocephalic and atraumatic.     Right Ear: Tympanic membrane and external ear normal.     Left Ear: Tympanic membrane and external ear normal.     Nose: No congestion.     Mouth/Throat:     Mouth: Mucous membranes are moist.  Eyes:     General:        Right eye: No discharge.        Left eye: No discharge.      Conjunctiva/sclera: Conjunctivae normal.     Pupils: Pupils are equal, round, and reactive to light.  Neck:     Thyroid: No thyromegaly.     Vascular: No JVD.  Cardiovascular:     Rate and Rhythm: Normal rate and regular rhythm.     Heart sounds: Normal heart sounds. No murmur heard.      No friction rub. No gallop.  Pulmonary:     Effort: Pulmonary effort is normal.     Breath sounds: Normal breath sounds. No wheezing, rhonchi or rales.  Abdominal:     Palpations: Abdomen is soft. There is no mass.     Tenderness: There is no abdominal tenderness.  Musculoskeletal:        General: Normal range of motion.     Cervical back: Neck supple.  Lymphadenopathy:     Cervical: No cervical adenopathy.  Skin:    General: Skin is warm and dry.  Neurological:     Mental Status: She is alert.     Deep Tendon Reflexes: Reflexes are normal and symmetric.     Wt Readings from Last 3 Encounters:  10/09/22 179 lb (81.2 kg)  05/12/22 185 lb (83.9 kg)  07/28/21 198 lb (89.8 kg)    BP 104/60   Pulse 92   Temp 98.3 F (36.8 C) (Oral)   Ht 5' 3" (1.6 m)   Wt 179 lb (81.2 kg)   SpO2 98%   BMI 31.71 kg/m   Assessment and Plan:  1. 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  We will obtain point-of-care influenza AB. - POCT Influenza A/B  2. Influenza B Influenza noted to be positive and we will treat with Tamiflu 75 mg twice a day for 5 days. - oseltamivir (TAMIFLU) 75 MG capsule; Take 1 capsule (75 mg total) by mouth 2 (two) times daily.  Dispense: 10 capsule; Refill: 0  3. Acute cough Patient with an acute cough is now found to be allergic to hydrocodone.  We will call in Promethazine DM teaspoon to 6 hours as needed for cough. - promethazine-dextromethorphan (PROMETHAZINE-DM) 6.25-15 MG/5ML syrup; Take 5 mLs by mouth 4 (four) times daily as needed for cough.  Dispense: 118 mL; Refill: 0     Otilio Miu, MD

## 2022-10-14 ENCOUNTER — Other Ambulatory Visit (HOSPITAL_COMMUNITY): Payer: Self-pay

## 2022-10-14 ENCOUNTER — Other Ambulatory Visit: Payer: Self-pay

## 2022-10-14 MED ORDER — AMPHETAMINE-DEXTROAMPHET ER 20 MG PO CP24
40.0000 mg | ORAL_CAPSULE | Freq: Every day | ORAL | 0 refills | Status: DC
  Filled 2022-10-14 – 2022-10-16 (×2): qty 60, 30d supply, fill #0

## 2022-10-16 ENCOUNTER — Other Ambulatory Visit (HOSPITAL_COMMUNITY): Payer: Self-pay

## 2022-11-12 ENCOUNTER — Other Ambulatory Visit (HOSPITAL_BASED_OUTPATIENT_CLINIC_OR_DEPARTMENT_OTHER): Payer: Self-pay

## 2022-11-12 ENCOUNTER — Encounter (HOSPITAL_BASED_OUTPATIENT_CLINIC_OR_DEPARTMENT_OTHER): Payer: Self-pay | Admitting: Pharmacist

## 2022-11-12 DIAGNOSIS — F330 Major depressive disorder, recurrent, mild: Secondary | ICD-10-CM | POA: Diagnosis not present

## 2022-11-12 DIAGNOSIS — F5105 Insomnia due to other mental disorder: Secondary | ICD-10-CM | POA: Diagnosis not present

## 2022-11-12 DIAGNOSIS — F908 Attention-deficit hyperactivity disorder, other type: Secondary | ICD-10-CM | POA: Diagnosis not present

## 2022-11-12 DIAGNOSIS — F411 Generalized anxiety disorder: Secondary | ICD-10-CM | POA: Diagnosis not present

## 2022-11-12 MED ORDER — VENLAFAXINE HCL ER 75 MG PO CP24: 75.0000 mg | ORAL_CAPSULE | Freq: Every day | ORAL | 0 refills | Status: DC

## 2022-11-12 MED ORDER — AMPHETAMINE-DEXTROAMPHET ER 20 MG PO CP24
40.0000 mg | ORAL_CAPSULE | Freq: Every day | ORAL | 0 refills | Status: DC
  Filled 2022-11-13: qty 180, 90d supply, fill #0

## 2022-11-12 MED ORDER — AMPHETAMINE-DEXTROAMPHETAMINE 10 MG PO TABS
10.0000 mg | ORAL_TABLET | Freq: Every day | ORAL | 0 refills | Status: DC
  Filled 2022-11-12: qty 90, 90d supply, fill #0

## 2022-11-13 ENCOUNTER — Other Ambulatory Visit (HOSPITAL_BASED_OUTPATIENT_CLINIC_OR_DEPARTMENT_OTHER): Payer: Self-pay

## 2022-11-13 ENCOUNTER — Other Ambulatory Visit: Payer: Self-pay

## 2022-11-16 ENCOUNTER — Other Ambulatory Visit (HOSPITAL_COMMUNITY): Payer: Self-pay

## 2022-12-10 DIAGNOSIS — F411 Generalized anxiety disorder: Secondary | ICD-10-CM | POA: Diagnosis not present

## 2022-12-10 DIAGNOSIS — F909 Attention-deficit hyperactivity disorder, unspecified type: Secondary | ICD-10-CM | POA: Diagnosis not present

## 2022-12-21 ENCOUNTER — Encounter: Payer: Self-pay | Admitting: Family Medicine

## 2022-12-21 ENCOUNTER — Ambulatory Visit (INDEPENDENT_AMBULATORY_CARE_PROVIDER_SITE_OTHER): Payer: 59 | Admitting: Family Medicine

## 2022-12-21 ENCOUNTER — Ambulatory Visit
Admission: RE | Admit: 2022-12-21 | Discharge: 2022-12-21 | Disposition: A | Discharge status: Home / Self Care | Payer: 59 | Source: Ambulatory Visit | Attending: Family Medicine | Admitting: Family Medicine

## 2022-12-21 ENCOUNTER — Ambulatory Visit
Admission: RE | Admit: 2022-12-21 | Discharge: 2022-12-21 | Disposition: A | Discharge status: Home / Self Care | Payer: 59 | Attending: Family Medicine | Admitting: Family Medicine

## 2022-12-21 VITALS — BP 128/78 | HR 97 | Ht 63.0 in | Wt 181.0 lb

## 2022-12-21 DIAGNOSIS — F411 Generalized anxiety disorder: Secondary | ICD-10-CM | POA: Diagnosis not present

## 2022-12-21 DIAGNOSIS — G5603 Carpal tunnel syndrome, bilateral upper limbs: Secondary | ICD-10-CM

## 2022-12-21 DIAGNOSIS — M5412 Radiculopathy, cervical region: Secondary | ICD-10-CM

## 2022-12-21 DIAGNOSIS — M25511 Pain in right shoulder: Secondary | ICD-10-CM | POA: Diagnosis not present

## 2022-12-21 DIAGNOSIS — F909 Attention-deficit hyperactivity disorder, unspecified type: Secondary | ICD-10-CM | POA: Diagnosis not present

## 2022-12-21 MED ORDER — PREDNISONE 10 MG PO TABS: 10.0000 mg | ORAL_TABLET | Freq: Every day | ORAL | 0 refills | Status: DC

## 2022-12-21 MED ORDER — MELOXICAM 15 MG PO TABS: 15.0000 mg | ORAL_TABLET | Freq: Every day | ORAL | 0 refills | Status: DC

## 2022-12-21 NOTE — Progress Notes (Signed)
Date:  12/21/2022   Name:  [name]   DOB:  1996-03-04   MRN:  [id number]   Chief Complaint: Shoulder Pain (R) shoulder pain- hurts to rotate or lift arm up at the shoulder- numbness comes more often throughout the day)  Shoulder Pain  The pain is present in the right shoulder (posterior). This is a new problem. The current episode started 1 to 4 weeks ago. The problem occurs constantly. The problem has been waxing and waning. The quality of the pain is described as aching. The pain is at a severity of 8/10. Associated symptoms include numbness and tingling. The symptoms are aggravated by activity. She has tried NSAIDS and acetaminophen for the symptoms. The treatment provided mild relief.  Neck Pain  This is a new problem. The current episode started more than 1 month ago. The problem occurs intermittently. The pain is present in the right side (trapezius). The quality of the pain is described as aching. The pain is mild. The symptoms are aggravated by position. Associated symptoms include numbness and tingling. Pertinent negatives include no chest pain or trouble swallowing. Associated symptoms comments: Difficult to use. She has tried acetaminophen and NSAIDs for the symptoms. The treatment provided moderate relief.    Lab Results  Component Value Date   NA 139 05/12/2022   K 4.0 05/12/2022   CO2 22 05/12/2022   GLUCOSE 84 05/12/2022   BUN 12 05/12/2022   CREATININE 0.60 05/12/2022   CALCIUM 9.1 05/12/2022   EGFR 126 05/12/2022   GFRNONAA 129 05/01/2020   Lab Results  Component Value Date   CHOL 164 05/12/2022   HDL 48 05/12/2022   LDLCALC 105 (H) 05/12/2022   TRIG 52 05/12/2022   Lab Results  Component Value Date   TSH 2.310 05/12/2022   Lab Results  Component Value Date   HGBA1C 5.0 05/12/2022   Lab Results  Component Value Date   WBC 4.1 05/12/2022   HGB 14.6 05/12/2022   HCT 43.0 05/12/2022   MCV 90 05/12/2022   PLT 192 05/12/2022   Lab Results   Component Value Date   ALT 16 05/12/2022   AST 15 05/12/2022   ALKPHOS 70 05/12/2022   BILITOT 0.5 05/12/2022   No results found for: "25OHVITD2", "25OHVITD3", "VD25OH"   Review of Systems  HENT:  Negative for trouble swallowing.   Eyes:  Negative for visual disturbance.  Respiratory:  Negative for chest tightness.   Cardiovascular:  Negative for chest pain, palpitations and leg swelling.  Gastrointestinal:  Negative for abdominal pain.  Endocrine: Negative for polydipsia and polyuria.  Genitourinary:  Negative for difficulty urinating and menstrual problem.  Musculoskeletal:  Positive for neck pain.  Neurological:  Positive for tingling and numbness.    Patient Active Problem List   Diagnosis Date Noted   Cesarean delivery delivered - Repeat 5/24 03/04/2018   Postpartum care following vaginal delivery 03/04/2018   Seizure-like activity ([place]) 11/30/2017   Impaired glucose tolerance test 09/23/2017   Chronic pelvic pain in female 02/01/2012    Allergies  Allergen Reactions   Hydrocodone Hives and Shortness Of Breath   Aspirin Hives    Pt states that she can tolerate ibuprofen   Pollen Extract Other (See Comments)    Seasonal allergy   Shellfish Allergy Nausea And Vomiting   Zoloft [Sertraline Hcl]    Mold Extract [Trichophyton Mentagrophyte] Itching and Rash    Past Surgical History:  Procedure Laterality Date   CESAREAN SECTION  CESAREAN SECTION N/A 03/04/2018   Procedure: Repeat CESAREAN SECTION;  Surgeon: Charyl Bigger, MD;  Location: [facility];  Service: Obstetrics;  Laterality: N/A;  EDD: 03/10/18 Allergy: Aspirin, Shellfish   CHOLECYSTECTOMY     LAPAROSCOPY  2012   diagnose endometriosis   TONSILLECTOMY      Social History   Tobacco Use   Smoking status: Never   Smokeless tobacco: Never  Vaping Use   Vaping Use: Never used  Substance Use Topics   Alcohol use: Yes    Alcohol/week: 2.0 standard drinks of alcohol    Types: 2 Glasses of  wine per week   Drug use: No     Medication list has been reviewed and updated.  Current Meds  Medication Sig   amphetamine-dextroamphetamine (ADDERALL XR) 20 MG 24 hr capsule Take 2 capsules (40 mg total) by mouth daily.   amphetamine-dextroamphetamine (ADDERALL XR) 20 MG 24 hr capsule Take 2 capsules (40 mg total) by mouth daily.   amphetamine-dextroamphetamine (ADDERALL) 10 MG tablet Take 1 tablet (10 mg total) by mouth daily at 5pm.   cyanocobalamin 2000 MCG tablet Take 2,000 mcg by mouth daily.   eszopiclone (LUNESTA) 1 MG TABS tablet Take 1 tablet (1 mg total) by mouth at bedtime as needed.   venlafaxine XR (EFFEXOR XR) 75 MG 24 hr capsule Take 1 capsule (75 mg total) by mouth daily with breakfast.       12/21/2022    7:58 AM 10/09/2022   11:50 AM 05/12/2022    9:46 AM 04/28/2021    8:50 AM  GAD 7 : Generalized Anxiety Score  Nervous, Anxious, on Edge 0 0 2 2  Control/stop worrying 0 0 2 2  Worry too much - different things 0 0 2 2  Trouble relaxing 0 0 2 2  Restless 0 0 1 0  Easily annoyed or irritable 0 0 1 0  Afraid - awful might happen 0 0 0 0  Total GAD 7 Score 0 0 10 8  Anxiety Difficulty Not difficult at all Not difficult at all Somewhat difficult Not difficult at all       12/21/2022    7:58 AM 10/09/2022   11:49 AM 05/12/2022    9:46 AM  Depression screen PHQ 2/9  Decreased Interest 0 0 1  Down, Depressed, Hopeless 0 0 1  PHQ - 2 Score 0 0 2  Altered sleeping 0 0 2  Tired, decreased energy 0 0 1  Change in appetite 0 0 0  Feeling bad or failure about yourself  0 0 0  Trouble concentrating 0 0 2  Moving slowly or fidgety/restless 0 0 1  Suicidal thoughts 0 0 0  PHQ-9 Score 0 0 8  Difficult doing work/chores Not difficult at all Not difficult at all Not difficult at all    BP Readings from Last 3 Encounters:  12/21/22 128/78  10/09/22 104/60  05/12/22 110/60    Physical Exam Vitals reviewed.  HENT:     Head: Normocephalic.     Right Ear: Tympanic  membrane, ear canal and external ear normal. There is no impacted cerumen.     Left Ear: Tympanic membrane, ear canal and external ear normal. There is no impacted cerumen.     Nose: Nose normal. No congestion or rhinorrhea.     Mouth/Throat:     Mouth: Mucous membranes are moist.  Eyes:     Pupils: Pupils are equal, round, and reactive to light.  Cardiovascular:  Heart sounds: No murmur heard.    No friction rub. No gallop.  Pulmonary:     Effort: Pulmonary effort is normal.  Abdominal:     General: Bowel sounds are normal.  Musculoskeletal:     Right shoulder: Tenderness present. No swelling, deformity or bony tenderness. Decreased range of motion. Decreased strength. Normal pulse.     Cervical back: Normal range of motion and neck supple. No rigidity or tenderness.     Comments: Tender right trapezius  Neurological:     Mental Status: She is alert.     Sensory: Sensation is intact.     Motor: Motor function is intact.     Wt Readings from Last 3 Encounters:  12/21/22 181 lb (82.1 kg)  10/09/22 179 lb (81.2 kg)  05/12/22 185 lb (83.9 kg)    BP 128/78   Pulse 97   Ht '5\' 3"'$  (1.6 m)   Wt 181 lb (82.1 kg)   SpO2 99%   BMI 32.06 kg/m   Assessment and Plan: 1. Acute pain of right shoulder New onset.  Persistent.  Stable.  But patient has limited range of motion due to inability to abduct arm and extension and flexion.  Pain is located in the right posterior aspect of the shoulder consistent with possible rotator cuff issue.  Patient will be initiated on meloxicam 15 mg once a day and prednisone is going to be given for cervical radiculopathy alone this may also help.  We will obtain an x-ray of the shoulder and refer to sports medicine for evaluation. - DG Shoulder Right - meloxicam (MOBIC) 15 MG tablet; Take 1 tablet (15 mg total) by mouth daily.  Dispense: 30 tablet; Refill: 0 - predniSONE (DELTASONE) 10 MG tablet; Take 1 tablet (10 mg total) by mouth daily with  breakfast. Taper 4,4,4,3,3,3,2,2,2,1,1,1  Dispense: 30 tablet; Refill: 0  2. Cervical radiculopathy Chronic pain persistent pain pain in the right trapezius but is having.  However is having paresthesias without motor weakness.  This involves the right arm.  In addition to the above anti-inflammatory we will initiate a prednisone taper beginning at 40 mg.  Will hold on doing the cervical spine panel until results of the medications may help with the possible disc irritation.- predniSONE (DELTASONE) 10 MG tablet; Take 1 tablet (10 mg total) by mouth daily with breakfast. Taper 4,4,4,3,3,3,2,2,2,1,1,1  Dispense: 30 tablet; Refill: 0  3. Bilateral carpal tunnel syndrome Patient is also having pins and needle sensation in both hands and has a positive Phalen and positive Tinel's sign.  Will initiate NSAID and prednisone and if continued symptomatology may need to be further evaluation with sports medicine. - meloxicam (MOBIC) 15 MG tablet; Take 1 tablet (15 mg total) by mouth daily.  Dispense: 30 tablet; Refill: 0 - predniSONE (DELTASONE) 10 MG tablet; Take 1 tablet (10 mg total) by mouth daily with breakfast. Taper 4,4,4,3,3,3,2,2,2,1,1,1  Dispense: 30 tablet; Refill: 0     Otilio Miu, MD

## 2022-12-28 ENCOUNTER — Encounter: Payer: Self-pay | Admitting: Family Medicine

## 2022-12-28 ENCOUNTER — Other Ambulatory Visit (INDEPENDENT_AMBULATORY_CARE_PROVIDER_SITE_OTHER): Payer: 59 | Admitting: Radiology

## 2022-12-28 ENCOUNTER — Ambulatory Visit (INDEPENDENT_AMBULATORY_CARE_PROVIDER_SITE_OTHER): Payer: 59 | Admitting: Family Medicine

## 2022-12-28 VITALS — BP 118/78 | HR 78 | Ht 63.0 in | Wt 182.0 lb

## 2022-12-28 DIAGNOSIS — M67911 Unspecified disorder of synovium and tendon, right shoulder: Secondary | ICD-10-CM | POA: Diagnosis not present

## 2022-12-28 DIAGNOSIS — M5412 Radiculopathy, cervical region: Secondary | ICD-10-CM | POA: Diagnosis not present

## 2022-12-28 MED ORDER — GABAPENTIN 100 MG PO CAPS: 100.0000 mg | ORAL_CAPSULE | Freq: Every evening | ORAL | 0 refills | Status: DC | PRN

## 2022-12-28 MED ORDER — CYCLOBENZAPRINE HCL 5 MG PO TABS: 5.0000 mg | ORAL_TABLET | Freq: Every evening | ORAL | 0 refills | Status: DC | PRN

## 2022-12-28 MED ORDER — TRIAMCINOLONE ACETONIDE 40 MG/ML IJ SUSP
40.0000 mg | Freq: Once | INTRAMUSCULAR | Status: AC
  Administered 2022-12-28: 40 mg via INTRAMUSCULAR

## 2022-12-28 MED ORDER — DICLOFENAC SODIUM 75 MG PO TBEC: 75.0000 mg | DELAYED_RELEASE_TABLET | Freq: Two times a day (BID) | ORAL | 0 refills | Status: DC

## 2022-12-28 NOTE — Progress Notes (Signed)
Primary Care / Sports Medicine Office Visit  Patient Information:  Patient ID: [name], female DOB: 1995-10-29 Age: 27 y.o. MRN: [id number]   [name] is a pleasant 27 y.o. female presenting with the following:  Chief Complaint  Patient presents with   Neck Pain    For a couple months, has started to have shoulder pain within last couple weeks    Vitals:   12/28/22 1422  BP: 118/78  Pulse: 78  SpO2: 97%   Vitals:   12/28/22 1422  Weight: 182 lb (82.6 kg)  Height: 5\' 3"  (1.6 m)   Body mass index is 32.24 kg/m.  DG Shoulder Right  Result Date: 12/21/2022 CLINICAL DATA:  Posterior right shoulder/trapezius pain. EXAM: RIGHT SHOULDER - 2+ VIEW COMPARISON:  None Available. FINDINGS: Normal bone mineralization. Joint spaces are preserved and normally aligned. No acute fracture or dislocation. The visualized portion of the right lung is unremarkable. IMPRESSION: Normal radiographs of the right shoulder. Electronically Signed   By: Yvonne Kendall M.D.   On: 12/21/2022 09:45     Independent interpretation of notes and tests performed by another provider:   None  Procedures performed:   Procedure:  Injection of right subacromial space under ultrasound guidance. Ultrasound guidance utilized for in-plane approach to right subacromial space, no sonographic evidence of tendinopathy noted Samsung HS60 device utilized with permanent recording / reporting. Verbal informed consent obtained and verified. Skin prepped in a sterile fashion. Ethyl chloride for topical local analgesia.  Completed without difficulty and tolerated well. Medication: triamcinolone acetonide 40 mg/mL suspension for injection 1 mL total and 2 mL lidocaine 1% without epinephrine utilized for needle placement anesthetic Advised to contact for fevers/chills, erythema, induration, drainage, or persistent bleeding.   Pertinent History, Exam, Impression, and Recommendations:   [name] was seen  today for neck pain.  Disorder of rotator cuff, right Assessment & Plan: See additional assessment(s) for plan details.   Examination focal to right rotator cuff, supraspinatus, positive impingement, no weakness throughout the rotator cuff.  Plan as follows: - Patient like to proceed with subacromial corticosteroid injection given degree of symptomatology - Diclofenac - Follow-up as needed  Orders: -     Diclofenac Sodium; Take 1 tablet (75 mg total) by mouth 2 (two) times daily.  Dispense: 30 tablet; Refill: 0 -     [place] LIMITED JOINT SPACE STRUCTURES UP RIGHT; Future -     Triamcinolone Acetonide  Right cervical radiculopathy Assessment & Plan: Acute on chronic presentation of most recently noted several month history of right neck pain, intermittent paresthesias, past few weeks of focal right shoulder pain.  Denies trauma.  Examination findings demonstrate preserved sensorimotor function bilaterally, there is positive Spurling's on the right.  Plan as follows: - Diclofenac regimen - Gabapentin and cyclobenzaprine as needed - Follow-up as needed  Orders: -     Diclofenac Sodium; Take 1 tablet (75 mg total) by mouth 2 (two) times daily.  Dispense: 30 tablet; Refill: 0 -     Cyclobenzaprine HCl; Take 1-2 tablets (5-10 mg total) by mouth at bedtime as needed for muscle spasms.  Dispense: 28 tablet; Refill: 0 -     Gabapentin; Take 1 capsule (100 mg total) by mouth at bedtime as needed.  Dispense: 14 capsule; Refill: 0 -     [place] LIMITED JOINT SPACE STRUCTURES UP RIGHT; Future -     Triamcinolone Acetonide     Orders & Medications Meds ordered this encounter  Medications  diclofenac (VOLTAREN) 75 MG EC tablet    Sig: Take 1 tablet (75 mg total) by mouth 2 (two) times daily.    Dispense:  30 tablet    Refill:  0   cyclobenzaprine (FLEXERIL) 5 MG tablet    Sig: Take 1-2 tablets (5-10 mg total) by mouth at bedtime as needed for muscle spasms.    Dispense:  28 tablet     Refill:  0   gabapentin (NEURONTIN) 100 MG capsule    Sig: Take 1 capsule (100 mg total) by mouth at bedtime as needed.    Dispense:  14 capsule    Refill:  0   triamcinolone acetonide (KENALOG-40) injection 40 mg   Orders Placed This Encounter  Procedures   [place] LIMITED JOINT SPACE STRUCTURES UP RIGHT     No follow-ups on file.     Montel Culver, MD, [facility]   Primary Care Sports Medicine Primary Care and Sports Medicine at [facility]

## 2023-01-06 DIAGNOSIS — F411 Generalized anxiety disorder: Secondary | ICD-10-CM | POA: Diagnosis not present

## 2023-01-06 DIAGNOSIS — F909 Attention-deficit hyperactivity disorder, unspecified type: Secondary | ICD-10-CM | POA: Diagnosis not present

## 2023-01-11 DIAGNOSIS — M5412 Radiculopathy, cervical region: Secondary | ICD-10-CM | POA: Insufficient documentation

## 2023-01-11 DIAGNOSIS — M67911 Unspecified disorder of synovium and tendon, right shoulder: Secondary | ICD-10-CM | POA: Insufficient documentation

## 2023-01-11 NOTE — Assessment & Plan Note (Signed)
See additional assessment(s) for plan details.   Examination focal to right rotator cuff, supraspinatus, positive impingement, no weakness throughout the rotator cuff.  Plan as follows: - Patient like to proceed with subacromial corticosteroid injection given degree of symptomatology - Diclofenac - Follow-up as needed

## 2023-01-11 NOTE — Assessment & Plan Note (Signed)
Acute on chronic presentation of most recently noted several month history of right neck pain, intermittent paresthesias, past few weeks of focal right shoulder pain.  Denies trauma.  Examination findings demonstrate preserved sensorimotor function bilaterally, there is positive Spurling's on the right.  Plan as follows: - Diclofenac regimen - Gabapentin and cyclobenzaprine as needed - Follow-up as needed

## 2023-01-14 DIAGNOSIS — F909 Attention-deficit hyperactivity disorder, unspecified type: Secondary | ICD-10-CM | POA: Diagnosis not present

## 2023-01-14 DIAGNOSIS — F411 Generalized anxiety disorder: Secondary | ICD-10-CM | POA: Diagnosis not present

## 2023-01-15 ENCOUNTER — Encounter: Payer: Self-pay | Admitting: Family Medicine

## 2023-01-15 ENCOUNTER — Ambulatory Visit: Payer: 59 | Admitting: Family Medicine

## 2023-01-15 ENCOUNTER — Ambulatory Visit
Admission: RE | Admit: 2023-01-15 | Discharge: 2023-01-15 | Disposition: A | Discharge status: Home / Self Care | Payer: 59 | Source: Ambulatory Visit | Attending: Family Medicine | Admitting: Family Medicine

## 2023-01-15 ENCOUNTER — Ambulatory Visit
Admission: RE | Admit: 2023-01-15 | Discharge: 2023-01-15 | Disposition: A | Discharge status: Home / Self Care | Payer: 59 | Attending: Family Medicine | Admitting: Family Medicine

## 2023-01-15 ENCOUNTER — Ambulatory Visit (INDEPENDENT_AMBULATORY_CARE_PROVIDER_SITE_OTHER): Payer: 59 | Admitting: Family Medicine

## 2023-01-15 VITALS — BP 118/78 | HR 78 | Ht 63.0 in | Wt 175.0 lb

## 2023-01-15 DIAGNOSIS — M5412 Radiculopathy, cervical region: Secondary | ICD-10-CM | POA: Diagnosis not present

## 2023-01-15 DIAGNOSIS — M67911 Unspecified disorder of synovium and tendon, right shoulder: Secondary | ICD-10-CM | POA: Diagnosis not present

## 2023-01-15 MED ORDER — DICLOFENAC SODIUM 75 MG PO TBEC: 75.0000 mg | DELAYED_RELEASE_TABLET | Freq: Two times a day (BID) | ORAL | 0 refills | Status: DC

## 2023-01-15 MED ORDER — GABAPENTIN 100 MG PO CAPS: 100.0000 mg | ORAL_CAPSULE | Freq: Every day | ORAL | 0 refills | Status: DC

## 2023-01-15 NOTE — Progress Notes (Signed)
     Primary Care / Sports Medicine Office Visit  Patient Information:  Patient ID: [name], female DOB: 05/06/1996 Age: 27 y.o. MRN: [id number]   [name] is a pleasant 27 y.o. female presenting with the following:  Chief Complaint  Patient presents with   Shoulder Pain    Pt states it felt better for a short period now pain back    Vitals:   01/15/23 0915  BP: 118/78  Pulse: 78  SpO2: 99%   Vitals:   01/15/23 0915  Weight: 175 lb (79.4 kg)  Height: 5\' 3"  (1.6 m)   Body mass index is 31 kg/m.     Independent interpretation of notes and tests performed by another provider:   None  Procedures performed:   None  Pertinent History, Exam, Impression, and Recommendations:   [name] was seen today for shoulder pain.  Right cervical radiculopathy Assessment & Plan: Persistent recurrence of symptoms after 1-2 weeks despite subacromial corticosteroid injection.  Prior concern for cervical etiology now more likely.  Plan as follows: - Cervical spine x-rays - MRI cervical spine - Referral to pain and spine group - Utilize diclofenac twice daily scheduled - Maintain as needed dosing of cyclobenzaprine - Dose gabapentin 100-300 mg nightly - Pain and spine group will assume management once she is seen, patient advised to contact us for any issues between now and then  Orders: -     Ambulatory referral to Pain Clinic -     DG Cervical Spine Complete; Future -     MR CERVICAL SPINE WO CONTRAST; Future -     Diclofenac Sodium; Take 1 tablet (75 mg total) by mouth 2 (two) times daily.  Dispense: 60 tablet; Refill: 0 -     Gabapentin; Take 1-3 capsules (100-300 mg total) by mouth at bedtime.  Dispense: 60 capsule; Refill: 0  Disorder of rotator cuff, right Assessment & Plan: Patient received subacromial injection at last visit, did afford significant improvement in her symptoms though with relatively rapid recurrence of symptomatology.  Exam shows  now negative empty can, minimally equivocal impingement, 5/5 RC strength, interval improved exam.  Given her findings objectively and reported symptoms, concern for primary cervical etiology. See additional assessment(s) for plan details.  Orders: -     Diclofenac Sodium; Take 1 tablet (75 mg total) by mouth 2 (two) times daily.  Dispense: 60 tablet; Refill: 0     Orders & Medications Meds ordered this encounter  Medications   diclofenac (VOLTAREN) 75 MG EC tablet    Sig: Take 1 tablet (75 mg total) by mouth 2 (two) times daily.    Dispense:  60 tablet    Refill:  0   gabapentin (NEURONTIN) 100 MG capsule    Sig: Take 1-3 capsules (100-300 mg total) by mouth at bedtime.    Dispense:  60 capsule    Refill:  0   Orders Placed This Encounter  Procedures   DG Cervical Spine Complete   MR Cervical Spine Wo Contrast   Ambulatory referral to Pain Clinic     No follow-ups on file.     Jerrol Banana, MD, [facility]   Primary Care Sports Medicine Primary Care and Sports Medicine at [facility]

## 2023-01-15 NOTE — Assessment & Plan Note (Signed)
Persistent recurrence of symptoms after 1-2 weeks despite subacromial corticosteroid injection.  Prior concern for cervical etiology now more likely.  Plan as follows: - Cervical spine x-rays - MRI cervical spine - Referral to pain and spine group - Utilize diclofenac twice daily scheduled - Maintain as needed dosing of cyclobenzaprine - Dose gabapentin 100-300 mg nightly - Pain and spine group will assume management once she is seen, patient advised to contact us for any issues between now and then

## 2023-01-15 NOTE — Patient Instructions (Signed)
-   Cervical spine x-rays today - MRI cervical spine, coordinator will contact you to schedule - Referral to pain and spine group, coordinator will contact you to schedule - Utilize diclofenac twice daily scheduled, take with food - Maintain as needed dosing of cyclobenzaprine - Dose gabapentin 100-300 mg nightly - Contact us for any questions/concerns

## 2023-01-15 NOTE — Assessment & Plan Note (Signed)
Patient received subacromial injection at last visit, did afford significant improvement in her symptoms though with relatively rapid recurrence of symptomatology.  Exam shows now negative empty can, minimally equivocal impingement, 5/5 RC strength, interval improved exam.  Given her findings objectively and reported symptoms, concern for primary cervical etiology. See additional assessment(s) for plan details.

## 2023-01-16 ENCOUNTER — Ambulatory Visit
Admission: EM | Admit: 2023-01-16 | Discharge: 2023-01-16 | Disposition: A | Discharge status: Home / Self Care | Payer: 59 | Attending: Emergency Medicine | Admitting: Emergency Medicine

## 2023-01-16 ENCOUNTER — Ambulatory Visit (INDEPENDENT_AMBULATORY_CARE_PROVIDER_SITE_OTHER): Payer: 59

## 2023-01-16 DIAGNOSIS — S99911A Unspecified injury of right ankle, initial encounter: Secondary | ICD-10-CM | POA: Diagnosis not present

## 2023-01-16 DIAGNOSIS — S93401A Sprain of unspecified ligament of right ankle, initial encounter: Secondary | ICD-10-CM

## 2023-01-16 NOTE — ED Triage Notes (Signed)
Pt states that she fell playing softball. Hurt right ankle. She's taken gabapentin and diclofenac today. No relief.

## 2023-01-16 NOTE — ED Provider Notes (Signed)
[facility]    CSN: [id number] Arrival date & time: 01/16/23  1352      History   Chief Complaint Chief Complaint  Patient presents with   Ankle Pain    Entered by patient    HPI [name] is a 27 y.o. female.   HPI  27 year old female with a past medical history significant for seizures, endometriosis, otitis media, and gestational diabetes presents for evaluation of pain on the outside of her right ankle that started today.  She reports that she was playing softball in the outfield and while running she stepped in a hole, rolled her right ankle, and then wind up landing on her ankle.  She felt and heard a pop on the outside of her ankle.  She does have numbness and tingling in her toes.  She is able to weight-bear with pain.  Past Medical History:  Diagnosis Date   Endometriosis    Gestational diabetes    diet controlled   Otitis media    Seizures     Patient Active Problem List   Diagnosis Date Noted   Disorder of rotator cuff, right 01/11/2023   Right cervical radiculopathy 01/11/2023   Cesarean delivery delivered - Repeat 5/24 03/04/2018   Postpartum care following vaginal delivery 03/04/2018   Seizure-like activity 11/30/2017   Impaired glucose tolerance test 09/23/2017   Chronic pelvic pain in female 02/01/2012    Past Surgical History:  Procedure Laterality Date   CESAREAN SECTION     CESAREAN SECTION N/A 03/04/2018   Procedure: Repeat CESAREAN SECTION;  Surgeon: Vick FreesAlmquist, Susan E, MD;  [facility];  Service: Obstetrics;  Laterality: N/A;  EDD: 03/10/18 Allergy: Aspirin, Shellfish   CHOLECYSTECTOMY     LAPAROSCOPY  2012   diagnose endometriosis   TONSILLECTOMY      OB History     Gravida  3   Para  2   Term  2   Preterm      AB  1   Living  2      SAB  1   IAB      Ectopic      Multiple  0   Live Births  2            Home Medications    Prior to Admission medications   Medication Sig  Start Date End Date Taking? Authorizing Provider  amphetamine-dextroamphetamine (ADDERALL XR) 20 MG 24 hr capsule Take 2 capsules (40 mg total) by mouth daily. 03/05/22  Yes   amphetamine-dextroamphetamine (ADDERALL) 10 MG tablet Take 1 tablet (10 mg total) by mouth daily at 5pm. 11/12/22  Yes   cyanocobalamin 2000 MCG tablet Take 2,000 mcg by mouth daily.   Yes [provider]  diclofenac (VOLTAREN) 75 MG EC tablet Take 1 tablet (75 mg total) by mouth 2 (two) times daily. 01/15/23  Yes Jerrol BananaMatthews, Jason J, MD  gabapentin (NEURONTIN) 100 MG capsule Take 1-3 capsules (100-300 mg total) by mouth at bedtime. 01/15/23  Yes Jerrol BananaMatthews, Jason J, MD  venlafaxine XR (EFFEXOR XR) 75 MG 24 hr capsule Take 1 capsule (75 mg total) by mouth daily with breakfast. 11/12/22  Yes   FLUoxetine (PROZAC) 20 MG capsule Take 1 capsule (20 mg total) by mouth daily. 01/07/21 03/17/21  Duanne LimerickJones, Deanna C, MD  promethazine (PHENERGAN) 12.5 MG tablet Take 12.5 mg by mouth every 6 (six) hours as needed for nausea or vomiting.  04/17/19  [provider]  simethicone (MYLICON) 80  MG chewable tablet Chew 1 tablet (80 mg total) by mouth as needed for flatulence. 03/06/18 04/17/19  Neta Mends, CNM    Family History Family History  Problem Relation Age of Onset   Healthy Mother    Diabetes Mother    Hearing loss Mother    Healthy Father     Social History Social History   Tobacco Use   Smoking status: Never   Smokeless tobacco: Never  Vaping Use   Vaping Use: Never used  Substance Use Topics   Alcohol use: Yes    Alcohol/week: 2.0 standard drinks of alcohol    Types: 2 Glasses of wine per week   Drug use: No     Allergies   Hydrocodone, Aspirin, Pollen extract, Shellfish allergy, Zoloft [sertraline hcl], and Mold extract [trichophyton mentagrophyte]   Review of Systems Review of Systems  Musculoskeletal:  Positive for arthralgias and joint swelling.  Skin:  Negative for color change.  Neurological:   Positive for numbness. Negative for weakness.     Physical Exam Triage Vital Signs ED Triage Vitals [01/16/23 1403]  Enc Vitals Group     BP 125/77     Pulse Rate 99     Resp 20     Temp 98.9 F (37.2 C)     Temp Source Oral     SpO2 94 %     Weight      Height      Head Circumference      Peak Flow      Pain Score      Pain Loc      Pain Edu?      Excl. in GC?    No data found.  Updated Vital Signs BP 125/77 (BP Location: Left Arm)   Pulse 99   Temp 98.9 F (37.2 C) (Oral)   Resp 20   SpO2 94%   Visual Acuity Right Eye Distance:   Left Eye Distance:   Bilateral Distance:    Right Eye Near:   Left Eye Near:    Bilateral Near:     Physical Exam Vitals and nursing note reviewed.  Constitutional:      Appearance: Normal appearance. She is not ill-appearing.  HENT:     Head: Normocephalic and atraumatic.  Musculoskeletal:        General: Swelling, tenderness and signs of injury present. No deformity. Normal range of motion.  Skin:    General: Skin is warm and dry.     Capillary Refill: Capillary refill takes less than 2 seconds.     Findings: Bruising present.  Neurological:     General: No focal deficit present.     Mental Status: She is alert and oriented to person, place, and time.      UC Treatments / Results  Labs (all labs ordered are listed, but only abnormal results are displayed) Labs Reviewed - No data to display  EKG   Radiology DG Ankle Complete Right  Result Date: 01/16/2023 CLINICAL DATA:  Ankle injury EXAM: RIGHT ANKLE - COMPLETE 3 VIEW COMPARISON:  None Available. FINDINGS: There is no evidence of fracture, dislocation, or joint effusion. There is no evidence of arthropathy or other focal bone abnormality. Soft tissues are unremarkable. IMPRESSION: Negative. Electronically Signed   By: Allegra Lai M.D.   On: 01/16/2023 14:30    Procedures Procedures (including critical care time)  Medications Ordered in UC Medications - No  data to display  Initial Impression / Assessment and Plan /  UC Course  I have reviewed the triage vital signs and the nursing notes.  Pertinent labs & imaging results that were available during my care of the patient were reviewed by me and considered in my medical decision making (see chart for details).   Patient is a pleasant, nontoxic-appearing 27 year old female presenting for evaluation of right ankle pain after stepping in a hole, rolling her ankle, and landed on top of her ankle and foot.  She is able to bear weight but it does cause pain.  She does complain of numbness in her toes.  The patient has full range of motion of her ankles and full sensation in her toes.  DP and PT pulses are 2+.  There is tenderness with palpation of the distal third of the fibula and lateral malleolus.  Also some mild tenderness with palpation of the lateral aspect of the calcaneus and the base of the fifth metatarsal.  Very mild swelling present.  There is also some erythema to the outside ankle but this is secondary to patient applying ice.  Patient does take diclofenac twice daily and she took that this morning along with gabapentin for her neck.    I will obtain a radiograph of the right ankle to look for any bony abnormality.  Radiology impression of right ankle films states no evidence of fracture, dislocation, or joint effusion.  Negative exam.  I will discharge patient on the diagnosis of right ankle sprain and have her fitted with an Aircast.  She can continue her diclofenac and gabapentin that she has previously been prescribed.  I will also prescribe home physical therapy.  Ice, elevation to help with pain and swelling.  Return precautions reviewed.   Final Clinical Impressions(s) / UC Diagnoses   Final diagnoses:  Sprain of right ankle, unspecified ligament, initial encounter     Discharge Instructions      Keep your ankle elevated is much as possible to help decrease swelling and aid in  healing.  Apply moist heat to your ankle for 20 minutes at a time to help improve blood flow which will bring fresh oxygen and nutrients to the ligaments and help facilitate the removal of metabolic byproducts from inflammation.  Continue using your diclofenac and gabapentin as needed for pain.  Wear the Air cast ankle brace when up and moving.  You may take it off at nighttime, when bathing, and when not walking on her ankle.  Follow the rehabilitation exercises given your discharge instructions.  Wait to start the phase 1 exercises until 48 hours after your injury to give time for the inflammation to go down.  Progress to phase 2 after you can complete phase 1 with out any significant pain.      ED Prescriptions   None    PDMP not reviewed this encounter.   Becky Augustayan, Aneesa Romey, NP 01/16/23 1453

## 2023-01-16 NOTE — Discharge Instructions (Addendum)
Keep your ankle elevated is much as possible to help decrease swelling and aid in healing.  Apply moist heat to your ankle for 20 minutes at a time to help improve blood flow which will bring fresh oxygen and nutrients to the ligaments and help facilitate the removal of metabolic byproducts from inflammation.  Continue using your diclofenac and gabapentin as needed for pain.  Wear the Air cast ankle brace when up and moving.  You may take it off at nighttime, when bathing, and when not walking on her ankle.  Follow the rehabilitation exercises given your discharge instructions.  Wait to start the phase 1 exercises until 48 hours after your injury to give time for the inflammation to go down.  Progress to phase 2 after you can complete phase 1 with out any significant pain.

## 2023-01-18 ENCOUNTER — Encounter: Payer: Self-pay | Admitting: Family Medicine

## 2023-01-18 DIAGNOSIS — S93401A Sprain of unspecified ligament of right ankle, initial encounter: Secondary | ICD-10-CM | POA: Insufficient documentation

## 2023-01-18 DIAGNOSIS — M79671 Pain in right foot: Secondary | ICD-10-CM | POA: Diagnosis not present

## 2023-01-18 DIAGNOSIS — S93601A Unspecified sprain of right foot, initial encounter: Secondary | ICD-10-CM | POA: Diagnosis not present

## 2023-01-18 DIAGNOSIS — M25571 Pain in right ankle and joints of right foot: Secondary | ICD-10-CM | POA: Insufficient documentation

## 2023-01-20 ENCOUNTER — Ambulatory Visit
Admission: RE | Admit: 2023-01-20 | Discharge: 2023-01-20 | Disposition: A | Discharge status: Home / Self Care | Payer: 59 | Source: Ambulatory Visit | Attending: Family Medicine | Admitting: Family Medicine

## 2023-01-20 DIAGNOSIS — M542 Cervicalgia: Secondary | ICD-10-CM | POA: Diagnosis not present

## 2023-01-20 DIAGNOSIS — M5412 Radiculopathy, cervical region: Secondary | ICD-10-CM | POA: Diagnosis not present

## 2023-01-25 ENCOUNTER — Encounter: Payer: Self-pay | Admitting: Family Medicine

## 2023-01-25 DIAGNOSIS — S93601A Unspecified sprain of right foot, initial encounter: Secondary | ICD-10-CM | POA: Diagnosis not present

## 2023-01-25 DIAGNOSIS — S93401A Sprain of unspecified ligament of right ankle, initial encounter: Secondary | ICD-10-CM | POA: Diagnosis not present

## 2023-01-26 NOTE — Telephone Encounter (Signed)
Please advise, referral placed 11 days ago, they are a little behind on calling.

## 2023-01-29 ENCOUNTER — Ambulatory Visit: Payer: Self-pay | Admitting: *Deleted

## 2023-01-29 NOTE — Telephone Encounter (Signed)
  Chief Complaint: right ankle pain , swelling  Symptoms: injured right ankle, now swelling outer ankle, shooting pain warmth in heel when not wearing ortho boot. Awakens at night from sleep and limping Frequency: 2 weeks ago  Pertinent Negatives: Patient denies fever, no redness or streaks reported  Disposition: ED /[] Urgent Care (no appt availability in office) / Appointment(In office/virtual)/  [place] [facility]/ Home Care/ Refused Recommended Disposition /[] [place] [facility]/  Follow-up with PCP Additional Notes:   Patient has been seen at [facility]. Appt schedule 02/03/23 . Please advise if earlier appt available . On waitlist.     Reason for Disposition  [1] MODERATE pain (e.g., interferes with normal activities, limping) AND [2] present > 3 days  Answer Assessment - Initial Assessment Questions 1. ONSET: "When did the pain start?"      2 weeks ago  2. LOCATION: "Where is the pain located?"      Outer ankle and shooting warmth pain into heel when not wearing boot 3. PAIN: "How bad is the pain?"    (Scale 1-10; or mild, moderate, severe)  - MILD (1-3): doesn't interfere with normal activities.   - MODERATE (4-7): interferes with normal activities (e.g., work or school) or awakens from sleep, limping.   - SEVERE (8-10): excruciating pain, unable to do any normal activities, unable to walk.      Moderate awakens from sleep limping 4. WORK OR EXERCISE: "Has there been any recent work or exercise that involved this part of the body?"      na 5. CAUSE: "What do you think is causing the ankle pain?"     na 6. OTHER SYMPTOMS: "Do you have any other symptoms?" (e.g., calf pain, rash, fever, swelling)     No  7. PREGNANCY: "Is there any chance you are pregnant?" "When was your last menstrual period?"     na  Protocols used: Ankle Pain-A-AH

## 2023-02-01 DIAGNOSIS — F411 Generalized anxiety disorder: Secondary | ICD-10-CM | POA: Diagnosis not present

## 2023-02-01 DIAGNOSIS — F909 Attention-deficit hyperactivity disorder, unspecified type: Secondary | ICD-10-CM | POA: Diagnosis not present

## 2023-02-03 ENCOUNTER — Encounter: Payer: 59 | Admitting: Family Medicine
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
## 2023-02-08 ENCOUNTER — Other Ambulatory Visit (HOSPITAL_COMMUNITY): Payer: Self-pay

## 2023-02-08 DIAGNOSIS — F908 Attention-deficit hyperactivity disorder, other type: Secondary | ICD-10-CM | POA: Diagnosis not present

## 2023-02-08 DIAGNOSIS — F5105 Insomnia due to other mental disorder: Secondary | ICD-10-CM | POA: Diagnosis not present

## 2023-02-08 DIAGNOSIS — F411 Generalized anxiety disorder: Secondary | ICD-10-CM | POA: Diagnosis not present

## 2023-02-08 DIAGNOSIS — D519 Vitamin B12 deficiency anemia, unspecified: Secondary | ICD-10-CM | POA: Diagnosis not present

## 2023-02-08 DIAGNOSIS — F330 Major depressive disorder, recurrent, mild: Secondary | ICD-10-CM | POA: Diagnosis not present

## 2023-02-08 MED ORDER — AMPHETAMINE-DEXTROAMPHET ER 20 MG PO CP24
40.0000 mg | ORAL_CAPSULE | Freq: Every day | ORAL | 0 refills | Status: DC
  Filled 2023-02-08: qty 180, 90d supply, fill #0
  Filled 2023-03-01: qty 110, 55d supply, fill #0
  Filled 2023-03-01: qty 70, 35d supply, fill #0

## 2023-02-08 MED ORDER — VENLAFAXINE HCL ER 75 MG PO CP24
75.0000 mg | ORAL_CAPSULE | Freq: Every day | ORAL | 0 refills | Status: DC
  Filled 2023-02-08 – 2023-03-01 (×2): qty 90, 90d supply, fill #0

## 2023-02-10 DIAGNOSIS — F411 Generalized anxiety disorder: Secondary | ICD-10-CM | POA: Diagnosis not present

## 2023-02-10 DIAGNOSIS — F909 Attention-deficit hyperactivity disorder, unspecified type: Secondary | ICD-10-CM | POA: Diagnosis not present

## 2023-02-17 DIAGNOSIS — F909 Attention-deficit hyperactivity disorder, unspecified type: Secondary | ICD-10-CM | POA: Diagnosis not present

## 2023-02-17 DIAGNOSIS — F411 Generalized anxiety disorder: Secondary | ICD-10-CM | POA: Diagnosis not present

## 2023-02-19 ENCOUNTER — Other Ambulatory Visit (HOSPITAL_COMMUNITY): Payer: Self-pay

## 2023-03-01 ENCOUNTER — Other Ambulatory Visit (HOSPITAL_COMMUNITY): Payer: Self-pay

## 2023-03-03 DIAGNOSIS — D519 Vitamin B12 deficiency anemia, unspecified: Secondary | ICD-10-CM | POA: Diagnosis not present

## 2023-03-03 DIAGNOSIS — F411 Generalized anxiety disorder: Secondary | ICD-10-CM | POA: Diagnosis not present

## 2023-03-03 DIAGNOSIS — F909 Attention-deficit hyperactivity disorder, unspecified type: Secondary | ICD-10-CM | POA: Diagnosis not present

## 2023-03-11 DIAGNOSIS — F909 Attention-deficit hyperactivity disorder, unspecified type: Secondary | ICD-10-CM | POA: Diagnosis not present

## 2023-03-11 DIAGNOSIS — F411 Generalized anxiety disorder: Secondary | ICD-10-CM | POA: Diagnosis not present

## 2023-03-18 ENCOUNTER — Ambulatory Visit
Admission: RE | Admit: 2023-03-18 | Discharge: 2023-03-18 | Disposition: A | Discharge status: Home / Self Care | Payer: 59 | Source: Ambulatory Visit | Attending: Student in an Organized Health Care Education/Training Program | Admitting: Student in an Organized Health Care Education/Training Program

## 2023-03-18 ENCOUNTER — Ambulatory Visit (HOSPITAL_BASED_OUTPATIENT_CLINIC_OR_DEPARTMENT_OTHER): Payer: 59 | Admitting: Student in an Organized Health Care Education/Training Program

## 2023-03-18 ENCOUNTER — Other Ambulatory Visit: Payer: Self-pay | Admitting: Student in an Organized Health Care Education/Training Program

## 2023-03-18 ENCOUNTER — Other Ambulatory Visit (HOSPITAL_COMMUNITY): Payer: Self-pay

## 2023-03-18 ENCOUNTER — Encounter: Payer: Self-pay | Admitting: Student in an Organized Health Care Education/Training Program

## 2023-03-18 ENCOUNTER — Other Ambulatory Visit: Admit: 2023-03-18 | Payer: 59

## 2023-03-18 VITALS — BP 113/76 | HR 70 | Temp 98.4°F | Resp 16 | Ht 63.0 in | Wt 176.0 lb

## 2023-03-18 DIAGNOSIS — M5030 Other cervical disc degeneration, unspecified cervical region: Secondary | ICD-10-CM

## 2023-03-18 DIAGNOSIS — M546 Pain in thoracic spine: Secondary | ICD-10-CM

## 2023-03-18 DIAGNOSIS — G8929 Other chronic pain: Secondary | ICD-10-CM | POA: Insufficient documentation

## 2023-03-18 DIAGNOSIS — M545 Low back pain, unspecified: Secondary | ICD-10-CM | POA: Insufficient documentation

## 2023-03-18 DIAGNOSIS — M5481 Occipital neuralgia: Secondary | ICD-10-CM | POA: Insufficient documentation

## 2023-03-18 DIAGNOSIS — M5136 Other intervertebral disc degeneration, lumbar region: Secondary | ICD-10-CM | POA: Diagnosis not present

## 2023-03-18 DIAGNOSIS — M5134 Other intervertebral disc degeneration, thoracic region: Secondary | ICD-10-CM | POA: Diagnosis not present

## 2023-03-18 MED ORDER — PREGABALIN 25 MG PO CAPS
ORAL_CAPSULE | ORAL | 0 refills | Status: DC
  Filled 2023-03-18: qty 75, 45d supply, fill #0

## 2023-03-18 MED ORDER — GABAPENTIN 100 MG PO CAPS
100.0000 mg | ORAL_CAPSULE | Freq: Every day | ORAL | 0 refills | Status: DC
  Filled 2023-03-18: qty 60, 20d supply, fill #0

## 2023-03-18 NOTE — Progress Notes (Signed)
Safety precautions to be maintained throughout the outpatient stay will include: orient to surroundings, keep bed in low position, maintain call bell within reach at all times, provide assistance with transfer out of bed and ambulation.  

## 2023-03-18 NOTE — Patient Instructions (Addendum)
You will have imaging done prior to next appt. With pain clinic.  You have been referred to physical therapy.

## 2023-03-18 NOTE — Progress Notes (Signed)
Patient: [name]  Service Category: E/M  Provider: Edward Jolly, MD  DOB: [birth year]  DOS: 03/18/2023  Referring Provider: Jerrol Banana, MD  MRN: [id number]  Setting: Ambulatory outpatient  PCP: Duanne Limerick, MD  Type: New Patient  Specialty: Interventional Pain Management    Location: Office  Delivery: Face-to-face     Primary Reason(s) for Visit: Encounter for initial evaluation of one or more chronic problems (new to examiner) potentially causing chronic pain, and posing a threat to normal musculoskeletal function. (Level of risk: High) CC: Neck Pain and Back Pain (mid)  HPI  [name] is a 27 y.o. year old, female patient, who comes for the first time to our practice referred by Jerrol Banana, MD for our initial evaluation of her chronic pain. She has Chronic pelvic pain in female; Impaired glucose tolerance test; Seizure-like activity (HCC); Cesarean delivery delivered - Repeat 5/24; Postpartum care following vaginal delivery; Disorder of rotator cuff, right; Right cervical radiculopathy; Bilateral occipital neuralgia; Chronic bilateral low back pain without sciatica; and Degenerative cervical disc on their problem list. Today she comes in for evaluation of her Neck Pain and Back Pain (mid)  Pain Assessment: Location: Posterior Neck Radiating: up to forehead, "like a nagging headache" Onset: More than a month ago Duration: Chronic pain Quality: Aching, Dull (Back pain="stabbing") Severity: 2 /10 (subjective, self-reported pain score)  Effect on ADL: limits daily activities Timing: Constant Modifying factors: sometimes heat and anti-inflamm. help BP: 113/76  HR: 70  Onset and Duration: Gradual Cause of pain:  gym/sports Severity: No change since onset, NAS-11 at its worse: 10/10, NAS-11 at its best: 0/10, NAS-11 now: 6/10, and NAS-11 on the average: 4/10 Timing: Afternoon, Night, During activity or exercise, After activity or exercise, and After a period of  immobility Aggravating Factors: Lifiting, Motion, Prolonged sitting, Prolonged standing, Twisting, Walking, and Working Alleviating Factors: Stretching, Hot packs, Lying down, Medications, Sitting, Standing, Warm showers or baths, and Walking Associated Problems: Fatigue, Inability to concentrate, Spasms, and Pain that wakes patient up Quality of Pain: Intermittent, Nagging, Pressure-like, Sharp, Shooting, Stabbing, Throbbing, and Uncomfortable Previous Examinations or Tests: CT scan, MRI scan, Nerve block, and X-rays Previous Treatments: Narcotic medications, Relaxation therapy, and Stretching exercises  [name] is being evaluated for possible interventional pain management therapies for the treatment of her chronic pain.   [name] is a pleasant 27 year old female who presents with cervical spine pain as well as bilateral occipital pain that radiates from her posterior neck superiorly to her parietal region.  She has been seen by Dr. Ashley Royalty with sports medicine.  They have done a right subacromial bursa injection which did help out with her right shoulder pain however she continues to endorse cervical spine pain that is superior in location as well as occipital predominant headaches.  She is also endorsing mid thoracic and lower back pain.  No inciting or traumatic event.  No falls, no trauma.  She is currently on gabapentin which is not helpful.  She is also tried diclofenac did not really see much benefit from it.  Meds   Current Outpatient Medications:    amphetamine-dextroamphetamine (ADDERALL XR) 20 MG 24 hr capsule, Take 2 capsules (40 mg total) by mouth daily., Disp: 180 capsule, Rfl: 0   amphetamine-dextroamphetamine (ADDERALL) 10 MG tablet, Take 1 tablet (10 mg total) by mouth daily at 5pm., Disp: 90 tablet, Rfl: 0   cyclobenzaprine (FLEXERIL) 5 MG tablet, Take 5 mg by mouth 3 (three) times daily as needed  for muscle spasms., Disp: , Rfl:    diclofenac (VOLTAREN) 75 MG EC tablet,  Take 1 tablet (75 mg total) by mouth 2 (two) times daily., Disp: 60 tablet, Rfl: 0   pregabalin (LYRICA) 25 MG capsule, Take 1 capsule (25 mg total) by mouth daily for 15 days, THEN 1 capsule (25 mg total) 2 (two) times daily., Disp: 75 capsule, Rfl: 0   venlafaxine XR (EFFEXOR XR) 75 MG 24 hr capsule, Take 1 capsule (75 mg total) by mouth daily with breakfast., Disp: 90 capsule, Rfl: 0  Imaging Review  Cervical Imaging: Cervical MR wo contrast: Results for orders placed during the hospital encounter of 01/20/23  MR Cervical Spine Wo Contrast  Narrative CLINICAL DATA:  Provided history: Cervical radiculopathy, no red flags. Right cervical radiculopathy. Additional history provided by the scanning technologist: The patient reports ongoing neck pain (base of neck). Pain radiating into right shoulder/arm.  EXAM: MRI CERVICAL SPINE WITHOUT CONTRAST  TECHNIQUE: Multiplanar, multisequence MR imaging of the cervical spine was performed. No intravenous contrast was administered.  COMPARISON:  Cervical spine radiographs 01/15/2023.  FINDINGS: Alignment: No significant spondylolisthesis.  Vertebrae: Vertebral body height is maintained. No significant marrow edema or focal suspicious osseous lesion.  Cord: No signal abnormality identified within the cervical spinal cord.  Posterior Fossa, vertebral arteries, paraspinal tissues: No abnormality identified within included portions of the posterior fossa. Flow voids preserved within the imaged cervical vertebral arteries. No paraspinal mass or collection.  Disc levels:  Mild multilevel disc desiccation without significant disc height loss. No significant disc herniation, spinal canal stenosis or neural foraminal narrowing.  IMPRESSION: 1. Mild multilevel disc desiccation without significant disc height loss. 2. No significant disc herniation, spinal canal stenosis or neural foraminal narrowing.   Electronically Signed By: Jackey Loge D.O. On: 01/20/2023 19:18   Narrative CLINICAL DATA:  Radiculopathy  EXAM: CERVICAL SPINE - COMPLETE 5 VIEW  COMPARISON:  05/09/2011  FINDINGS: There is no evidence of cervical spine fracture or prevertebral soft tissue swelling. Alignment is normal. No other significant bone abnormalities are identified.  IMPRESSION: Negative cervical spine radiographs.   Electronically Signed By: Layla Maw M.D. On: 01/18/2023 00:40  DG Shoulder Right  Narrative CLINICAL DATA:  Posterior right shoulder/trapezius pain.  EXAM: RIGHT SHOULDER - 2+ VIEW  COMPARISON:  None Available.  FINDINGS: Normal bone mineralization. Joint spaces are preserved and normally aligned. No acute fracture or dislocation. The visualized portion of the right lung is unremarkable.  IMPRESSION: Normal radiographs of the right shoulder.   Electronically Signed By: Neita Garnet M.D. On: 12/21/2022 09:45   DG Knee Complete 4 Views Left  Narrative CLINICAL DATA:  Kicked in the left knee, initial encounter.  EXAM: LEFT KNEE - COMPLETE 4+ VIEW  COMPARISON:  None.  FINDINGS: No joint effusion or fracture.  IMPRESSION: No acute findings.   Electronically Signed By: Leanna Battles M.D. On: 04/21/2021 09:57   DG Ankle Complete Right  Narrative CLINICAL DATA:  Ankle injury  EXAM: RIGHT ANKLE - COMPLETE 3 VIEW  COMPARISON:  None Available.  FINDINGS: There is no evidence of fracture, dislocation, or joint effusion. There is no evidence of arthropathy or other focal bone abnormality. Soft tissues are unremarkable.  IMPRESSION: Negative.   Electronically Signed By: Allegra Lai M.D. On: 01/16/2023 14:30   Foot-L DG Complete: Results for orders placed during the hospital encounter of 02/24/09  DG Foot Complete Left  Narrative Clinical Data: Fall with left foot pain.  LEFT  FOOT - COMPLETE 3+ VIEW  Comparison: None  Findings: No acute fracture or  dislocation.  The soft tissues are unremarkable.  IMPRESSION: No acute findings.  The  Provider: Jari Pigg   DG Hand Complete Right  Narrative CLINICAL DATA:  Right hand pain after falling on hand playing kickball one day ago. First metacarpal pain.  EXAM: RIGHT HAND - COMPLETE 3+ VIEW  COMPARISON:  None Available.  FINDINGS: Normal bone mineralization. Joint spaces are preserved. No acute fracture is seen. No dislocation.  IMPRESSION: Normal right hand radiographs.   Electronically Signed By: Neita Garnet M.D. On: 05/21/2022 14:26  Hand-L DG Complete: No results found for this or any previous visit.   Complexity Note: Imaging results reviewed.                         ROS  Cardiovascular: No reported cardiovascular signs or symptoms such as High blood pressure, coronary artery disease, abnormal heart rate or rhythm, heart attack, blood thinner therapy or heart weakness and/or failure Pulmonary or Respiratory: No reported pulmonary signs or symptoms such as wheezing and difficulty taking a deep full breath (Asthma), difficulty blowing air out (Emphysema), coughing up mucus (Bronchitis), persistent dry cough, or temporary stoppage of breathing during sleep Neurological: No reported neurological signs or symptoms such as seizures, abnormal skin sensations, urinary and/or fecal incontinence, being born with an abnormal open spine and/or a tethered spinal cord Psychological-Psychiatric: Anxiousness and Depressed Gastrointestinal: No reported gastrointestinal signs or symptoms such as vomiting or evacuating blood, reflux, heartburn, alternating episodes of diarrhea and constipation, inflamed or scarred liver, or pancreas or irrregular and/or infrequent bowel movements Genitourinary: No reported renal or genitourinary signs or symptoms such as difficulty voiding or producing urine, peeing blood, non-functioning kidney, kidney stones, difficulty emptying the bladder, difficulty  controlling the flow of urine, or chronic kidney disease Hematological: Brusing easily and Bleeding easily Endocrine: No reported endocrine signs or symptoms such as high or low blood sugar, rapid heart rate due to high thyroid levels, obesity or weight gain due to slow thyroid or thyroid disease Rheumatologic: No reported rheumatological signs and symptoms such as fatigue, joint pain, tenderness, swelling, redness, heat, stiffness, decreased range of motion, with or without associated rash Musculoskeletal: Negative for myasthenia gravis, muscular dystrophy, multiple sclerosis or malignant hyperthermia Work History: Working full time  Allergies  [name] is allergic to hydrocodone, aspirin, pollen extract, shellfish allergy, zoloft [sertraline hcl], and mold extract [trichophyton mentagrophyte].  Laboratory Chemistry Profile   Renal Lab Results  Component Value Date   BUN 12 05/12/2022   CREATININE 0.60 05/12/2022   BCR 20 05/12/2022   GFRAA 148 05/01/2020   GFRNONAA 129 05/01/2020   SPECGRAV 1.020 05/01/2020   PHUR 5.5 05/01/2020   PROTEINUR negative 05/01/2020     Electrolytes Lab Results  Component Value Date   NA 139 05/12/2022   K 4.0 05/12/2022   CL 104 05/12/2022   CALCIUM 9.1 05/12/2022   PHOS 3.7 01/16/2020     Hepatic Lab Results  Component Value Date   AST 15 05/12/2022   ALT 16 05/12/2022   ALBUMIN 4.3 05/12/2022   ALKPHOS 70 05/12/2022   LIPASE 143 09/19/2013     ID Lab Results  Component Value Date   HIV Non-reactive 08/10/2017   SARSCOV2NAA NEGATIVE 03/17/2021   PREGTESTUR Negative 05/01/2020     Bone No results found for: "VD25OH", "VD125OH2TOT", "ZO1096EA5", "WU9811BJ4", "25OHVITD1", "25OHVITD2", "25OHVITD3", "TESTOFREE", "TESTOSTERONE"   Endocrine  Lab Results  Component Value Date   GLUCOSE 84 05/12/2022   GLUCOSEU NEGATIVE 10/26/2017   HGBA1C 5.0 05/12/2022   TSH 2.310 05/12/2022     Neuropathy Lab Results  Component Value Date    HGBA1C 5.0 05/12/2022   HIV Non-reactive 08/10/2017     CNS No results found for: "COLORCSF", "APPEARCSF", "RBCCOUNTCSF", "WBCCSF", "POLYSCSF", "LYMPHSCSF", "EOSCSF", "PROTEINCSF", "GLUCCSF", "JCVIRUS", "CSFOLI", "IGGCSF", "LABACHR", "ACETBL"   Inflammation (CRP: Acute  ESR: Chronic) No results found for: "CRP", "ESRSEDRATE", "LATICACIDVEN"   Rheumatology No results found for: "RF", "ANA", "LABURIC", "URICUR", "LYMEIGGIGMAB", "LYMEABIGMQN", "HLAB27"   Coagulation Lab Results  Component Value Date   PLT 192 05/12/2022     Cardiovascular Lab Results  Component Value Date   HGB 14.6 05/12/2022   HCT 43.0 05/12/2022     Screening Lab Results  Component Value Date   SARSCOV2NAA NEGATIVE 03/17/2021   HIV Non-reactive 08/10/2017   PREGTESTUR Negative 05/01/2020     Cancer No results found for: "CEA", "CA125", "LABCA2"   Allergens No results found for: "ALMOND", "APPLE", "ASPARAGUS", "AVOCADO", "BANANA", "BARLEY", "BASIL", "BAYLEAF", "GREENBEAN", "LIMABEAN", "WHITEBEAN", "BEEFIGE", "REDBEET", "BLUEBERRY", "BROCCOLI", "CABBAGE", "MELON", "CARROT", "CASEIN", "CASHEWNUT", "CAULIFLOWER", "CELERY"     Note: Lab results reviewed.  PFSH  Drug: [name]  reports no history of drug use. Alcohol:  reports current alcohol use of about 2.0 standard drinks of alcohol per week. Tobacco:  reports that she has never smoked. She has never used smokeless tobacco. Medical:  has a past medical history of Endometriosis, Gestational diabetes, Otitis media, and Seizures (HCC). Family: family history includes Diabetes in her mother; Healthy in her father and mother; Hearing loss in her mother.  Past Surgical History:  Procedure Laterality Date   CESAREAN SECTION     CESAREAN SECTION N/A 03/04/2018   Procedure: Repeat CESAREAN SECTION;  Surgeon: Vick Frees, MD;  Location: [facility];  Service: Obstetrics;  Laterality: N/A;  EDD: 03/10/18 Allergy: Aspirin, Shellfish   CHOLECYSTECTOMY      LAPAROSCOPY  2012   diagnose endometriosis   TONSILLECTOMY     Active Ambulatory Problems    Diagnosis Date Noted   Chronic pelvic pain in female 02/01/2012   Impaired glucose tolerance test 09/23/2017   Seizure-like activity (HCC) 11/30/2017   Cesarean delivery delivered - Repeat 5/24 03/04/2018   Postpartum care following vaginal delivery 03/04/2018   Disorder of rotator cuff, right 01/11/2023   Right cervical radiculopathy 01/11/2023   Bilateral occipital neuralgia 03/18/2023   Chronic bilateral low back pain without sciatica 03/18/2023   Degenerative cervical disc 03/18/2023   Resolved Ambulatory Problems    Diagnosis Date Noted   History of C-section 03/04/2018   Past Medical History:  Diagnosis Date   Endometriosis    Gestational diabetes    Otitis media    Seizures (HCC)    Constitutional Exam  General appearance: Well nourished, well developed, and well hydrated. In no apparent acute distress Vitals:   03/18/23 0906  BP: 113/76  Pulse: 70  Resp: 16  Temp: 98.4 F (36.9 C)  TempSrc: Temporal  SpO2: 100%  Weight: 176 lb (79.8 kg)  Height: 5\' 3"  (1.6 m)   BMI Assessment: Estimated body mass index is 31.18 kg/m as calculated from the following:   Height as of this encounter: 5\' 3"  (1.6 m).   Weight as of this encounter: 176 lb (79.8 kg).  BMI interpretation table: BMI level Category Range association with higher incidence of chronic pain  <18 kg/m2 Underweight  18.5-24.9 kg/m2 Ideal body weight   25-29.9 kg/m2 Overweight Increased incidence by 20%  30-34.9 kg/m2 Obese (Class I) Increased incidence by 68%  35-39.9 kg/m2 Severe obesity (Class II) Increased incidence by 136%  >40 kg/m2 Extreme obesity (Class III) Increased incidence by 254%   Patient's current BMI Ideal Body weight  Body mass index is 31.18 kg/m. Ideal body weight: 52.4 kg (115 lb 8.3 oz) Adjusted ideal body weight: 63.4 kg (139 lb 11.4 oz)   BMI Readings from Last 4 Encounters:   03/18/23 31.18 kg/m  01/15/23 31.00 kg/m  12/28/22 32.24 kg/m  12/21/22 32.06 kg/m   Wt Readings from Last 4 Encounters:  03/18/23 176 lb (79.8 kg)  01/15/23 175 lb (79.4 kg)  12/28/22 182 lb (82.6 kg)  12/21/22 181 lb (82.1 kg)    Psych/Mental status: Alert, oriented x 3 (person, place, & time)       Eyes: PERLA Respiratory: No evidence of acute respiratory distress  Cervical Spine Area Exam  Skin & Axial Inspection: No masses, redness, edema, swelling, or associated skin lesions Alignment: Symmetrical Functional ROM: Unrestricted ROM      Stability: No instability detected Muscle Tone/Strength: Functionally intact. No obvious neuro-muscular anomalies detected. Sensory (Neurological): Neurogenic pain pattern consistent with occipital neuralgia Palpation: No palpable anomalies             Upper Extremity (UE) Exam    Side: Right upper extremity  Side: Left upper extremity  Skin & Extremity Inspection: Skin color, temperature, and hair growth are WNL. No peripheral edema or cyanosis. No masses, redness, swelling, asymmetry, or associated skin lesions. No contractures.  Skin & Extremity Inspection: Skin color, temperature, and hair growth are WNL. No peripheral edema or cyanosis. No masses, redness, swelling, asymmetry, or associated skin lesions. No contractures.  Functional ROM: Unrestricted ROM          Functional ROM: Unrestricted ROM          Muscle Tone/Strength: Functionally intact. No obvious neuro-muscular anomalies detected.  Muscle Tone/Strength: Functionally intact. No obvious neuro-muscular anomalies detected.  Sensory (Neurological): Unimpaired          Sensory (Neurological): Unimpaired          Palpation: No palpable anomalies              Palpation: No palpable anomalies              Provocative Test(s):  Phalen's test: deferred Tinel's test: deferred Apley's scratch test (touch opposite shoulder):  Action 1 (Across chest): deferred Action 2 (Overhead):  deferred Action 3 (LB reach): deferred   Provocative Test(s):  Phalen's test: deferred Tinel's test: deferred Apley's scratch test (touch opposite shoulder):  Action 1 (Across chest): deferred Action 2 (Overhead): deferred Action 3 (LB reach): deferred    Thoracic Spine Area Exam  Skin & Axial Inspection: No masses, redness, or swelling Alignment: Symmetrical Functional ROM: Unrestricted ROM Stability: No instability detected Muscle Tone/Strength: Functionally intact. No obvious neuro-muscular anomalies detected. Sensory (Neurological): Unimpaired Muscle strength & Tone: No palpable anomalies Lumbar Spine Area Exam  Skin & Axial Inspection: No masses, redness, or swelling Alignment: Symmetrical Functional ROM: Pain restricted ROM affecting both sides Stability: No instability detected Muscle Tone/Strength: Functionally intact. No obvious neuro-muscular anomalies detected. Sensory (Neurological): Musculoskeletal pain pattern  Gait & Posture Assessment  Ambulation: Unassisted Gait: Relatively normal for age and body habitus Posture: WNL  Lower Extremity Exam    Side: Right lower extremity  Side: Left lower extremity  Stability: No  instability observed          Stability: No instability observed          Skin & Extremity Inspection: Skin color, temperature, and hair growth are WNL. No peripheral edema or cyanosis. No masses, redness, swelling, asymmetry, or associated skin lesions. No contractures.  Skin & Extremity Inspection: Skin color, temperature, and hair growth are WNL. No peripheral edema or cyanosis. No masses, redness, swelling, asymmetry, or associated skin lesions. No contractures.  Functional ROM: Unrestricted ROM                  Functional ROM: Unrestricted ROM                  Muscle Tone/Strength: Functionally intact. No obvious neuro-muscular anomalies detected.  Muscle Tone/Strength: Functionally intact. No obvious neuro-muscular anomalies detected.  Sensory  (Neurological): Unimpaired        Sensory (Neurological): Unimpaired        DTR: Patellar: deferred today Achilles: deferred today Plantar: deferred today  DTR: Patellar: deferred today Achilles: deferred today Plantar: deferred today  Palpation: No palpable anomalies  Palpation: No palpable anomalies    Assessment  Primary Diagnosis & Pertinent Problem List: The primary encounter diagnosis was Bilateral occipital neuralgia. Diagnoses of Chronic bilateral low back pain without sciatica, Degenerative cervical disc, and Chronic midline thoracic back pain were also pertinent to this visit.  Visit Diagnosis (New problems to examiner): 1. Bilateral occipital neuralgia   2. Chronic bilateral low back pain without sciatica   3. Degenerative cervical disc   4. Chronic midline thoracic back pain    Patient cervical MRI is largely unremarkable.  No evidence of canal or foraminal stenosis or advanced facet arthropathy or degenerative disc disease.  Patient may have occipital neuralgia.  Differential also includes migraines.  I recommend a trial of Lyrica as below.  Future considerations could include occipital nerve block.  For midthoracic and low back pain, recommend physical therapy and imaging studies as below.  Patient will follow back up with me in approximately 4 to 6 weeks to assess response and update treatment plan.   Imaging Orders         DG Lumbar Spine Complete W/Bend         DG Thoracic Spine 4V     Referral Orders         Ambulatory referral to Physical Therapy     Pharmacotherapy (current): Medications ordered:  Meds ordered this encounter  Medications   DISCONTD: gabapentin (NEURONTIN) 100 MG capsule    Sig: Take 1-3 capsules (100-300 mg total) by mouth at bedtime.    Dispense:  60 capsule    Refill:  0   pregabalin (LYRICA) 25 MG capsule    Sig: Take 1 capsule (25 mg total) by mouth daily for 15 days, THEN 1 capsule (25 mg total) 2 (two) times daily.    Dispense:  75  capsule    Refill:  0    Fill one day early if pharmacy is closed on scheduled refill date. May substitute for generic if available.   Medications administered during this visit: [name] had no medications administered during this visit.    Interventional management options: [name] was informed that there is no guarantee that she would be a candidate for interventional therapies. The decision will be based on the results of diagnostic studies, as well as [name]'s risk profile.  Procedure(s) under consideration:  Occipital nerve block    Provider-requested  follow-up: Return in about 6 weeks (around 04/29/2023) for Medication Management, in person.  Future Appointments  Date Time Provider Department Center  04/20/2023 10:40 AM Edward Jolly, MD [facility] None  05/17/2023  9:40 AM Duanne Limerick, MD [facility]    Duration of encounter: .  Total time on encounter, as per AMA guidelines included both the face-to-face and non-face-to-face time personally spent by the physician and/or other qualified health care professional(s) on the day of the encounter (includes time in activities that require the physician or other qualified health care professional and does not include time in activities normally performed by clinical staff). Physician's time may include the following activities when performed: Preparing to see the patient (e.g., pre-charting review of records, searching for previously ordered imaging, lab work, and nerve conduction tests) Review of prior analgesic pharmacotherapies. Reviewing PMP Interpreting ordered tests (e.g., lab work, imaging, nerve conduction tests) Performing post-procedure evaluations, including interpretation of diagnostic procedures Obtaining and/or reviewing separately obtained history Performing a medically appropriate examination and/or evaluation Counseling and educating the patient/family/caregiver Ordering medications, tests, or  procedures Referring and communicating with other health care professionals (when not separately reported) Documenting clinical information in the electronic or other health record Independently interpreting results (not separately reported) and communicating results to the patient/ family/caregiver Care coordination (not separately reported)  Note by: Edward Jolly, MD (TTS technology used. I apologize for any typographical errors that were not detected and corrected.) Date: 03/18/2023; Time: 10:51 AM

## 2023-03-29 DIAGNOSIS — F411 Generalized anxiety disorder: Secondary | ICD-10-CM | POA: Diagnosis not present

## 2023-03-29 DIAGNOSIS — F909 Attention-deficit hyperactivity disorder, unspecified type: Secondary | ICD-10-CM | POA: Diagnosis not present

## 2023-04-06 DIAGNOSIS — F411 Generalized anxiety disorder: Secondary | ICD-10-CM | POA: Diagnosis not present

## 2023-04-06 DIAGNOSIS — F909 Attention-deficit hyperactivity disorder, unspecified type: Secondary | ICD-10-CM | POA: Diagnosis not present

## 2023-04-20 ENCOUNTER — Encounter: Payer: Self-pay | Admitting: Student in an Organized Health Care Education/Training Program

## 2023-04-20 ENCOUNTER — Ambulatory Visit
Payer: 59 | Attending: Student in an Organized Health Care Education/Training Program | Admitting: Student in an Organized Health Care Education/Training Program

## 2023-04-20 VITALS — BP 119/50 | HR 89 | Temp 97.6°F | Resp 17 | Ht 63.0 in | Wt 185.0 lb

## 2023-04-20 DIAGNOSIS — M546 Pain in thoracic spine: Secondary | ICD-10-CM | POA: Diagnosis not present

## 2023-04-20 DIAGNOSIS — M545 Low back pain, unspecified: Secondary | ICD-10-CM | POA: Insufficient documentation

## 2023-04-20 DIAGNOSIS — M5030 Other cervical disc degeneration, unspecified cervical region: Secondary | ICD-10-CM

## 2023-04-20 DIAGNOSIS — M5481 Occipital neuralgia: Secondary | ICD-10-CM | POA: Diagnosis not present

## 2023-04-20 DIAGNOSIS — G8929 Other chronic pain: Secondary | ICD-10-CM | POA: Diagnosis not present

## 2023-04-20 MED ORDER — PREGABALIN 25 MG PO CAPS
50.0000 mg | ORAL_CAPSULE | Freq: Two times a day (BID) | ORAL | 2 refills | Status: DC
  Filled 2023-06-11: qty 120, 30d supply, fill #0

## 2023-04-20 NOTE — Progress Notes (Signed)
Safety precautions to be maintained throughout the outpatient stay will include: orient to surroundings, keep bed in low position, maintain call bell within reach at all times, provide assistance with transfer out of bed and ambulation.  

## 2023-04-20 NOTE — Progress Notes (Signed)
PROVIDER NOTE: Information contained herein reflects review and annotations entered in association with encounter. Interpretation of such information and data should be left to medically-trained personnel. Information provided to patient can be located elsewhere in the medical record under "Patient Instructions". Document created using STT-dictation technology, any transcriptional errors that may result from process are unintentional.    Patient: [name]  Service Category: E/M  Provider: Edward Jolly, MD  DOB: 1996-05-20  DOS: 04/20/2023  Referring Provider: Duanne Limerick, MD  MRN: [id number]  Specialty: Interventional Pain Management  PCP: Duanne Limerick, MD  Type: Established Patient  Setting: Ambulatory outpatient    Location: Office  Delivery: Face-to-face     HPI  [name], a 27 y.o. year old female, is here today because of her Bilateral occipital neuralgia [M54.81]. [name] primary complain today is Back Pain (mid) Pain Assessment: Severity of Chronic pain is reported as a 4 /10. Location: Back  /up to forehead "like a nagging headache". Onset: More than a month ago. Quality: Aching, Dull, Stabbing. Timing: Constant. Modifying factor(s): ice, heat, anti inflam, lyrica. Vitals:  height is 5\' 3"  (1.6 m) and weight is 185 lb (83.9 kg). Her temporal temperature is 97.6 F (36.4 C). Her blood pressure is 119/50 (abnormal) and her pulse is 89. Her respiration is 17 and oxygen saturation is 100%.  BMI: Estimated body mass index is 32.77 kg/m as calculated from the following:   Height as of this encounter: 5\' 3"  (1.6 m).   Weight as of this encounter: 185 lb (83.9 kg). Last encounter: 03/18/2023. Last procedure: Visit date not found.  Reason for encounter: follow-up evaluation.   Second patient visit, finding benefit with Lyrica, no side effects noted Lumbar, thoracic spine x-rays largely unremarkable other than mild degenerative disc disease., see below Occipital  headaches have also improved somewhat with Lyrica.    ROS  Constitutional: Denies any fever or chills Gastrointestinal: No reported hemesis, hematochezia, vomiting, or acute GI distress Musculoskeletal:  Midthoracic pain Neurological: No reported episodes of acute onset apraxia, aphasia, dysarthria, agnosia, amnesia, paralysis, loss of coordination, or loss of consciousness  Medication Review  FLUoxetine, amphetamine-dextroamphetamine, diclofenac, pregabalin, promethazine, simethicone, and venlafaxine XR  History Review  Allergy: [name] is allergic to hydrocodone, aspirin, pollen extract, shellfish allergy, zoloft [sertraline hcl], and mold extract [trichophyton mentagrophyte]. Drug: [name]  reports no history of drug use. Alcohol:  reports current alcohol use of about 2.0 standard drinks of alcohol per week. Tobacco:  reports that she has never smoked. She has never used smokeless tobacco. Social: [name]  reports that she has never smoked. She has never used smokeless tobacco. She reports current alcohol use of about 2.0 standard drinks of alcohol per week. She reports that she does not use drugs. Medical:  has a past medical history of Endometriosis, Gestational diabetes, Otitis media, and Seizures (HCC). Surgical: [name]  has a past surgical history that includes Cesarean section; Cholecystectomy; Tonsillectomy; laparoscopy (2012); and Cesarean section (N/A, 03/04/2018). Family: family history includes Diabetes in her mother; Healthy in her father and mother; Hearing loss in her mother.  Laboratory Chemistry Profile   Renal Lab Results  Component Value Date   BUN 12 05/12/2022   CREATININE 0.60 05/12/2022   BCR 20 05/12/2022   GFRAA 148 05/01/2020   GFRNONAA 129 05/01/2020    Hepatic Lab Results  Component Value Date   AST 15 05/12/2022   ALT 16 05/12/2022   ALBUMIN 4.3 05/12/2022  ALKPHOS 70 05/12/2022   LIPASE 143 09/19/2013    Electrolytes Lab Results   Component Value Date   NA 139 05/12/2022   K 4.0 05/12/2022   CL 104 05/12/2022   CALCIUM 9.1 05/12/2022   PHOS 3.7 01/16/2020    Bone No results found for: "VD25OH", "VD125OH2TOT", "ZO1096EA5", "WU9811BJ4", "25OHVITD1", "25OHVITD2", "25OHVITD3", "TESTOFREE", "TESTOSTERONE"  Inflammation (CRP: Acute Phase) (ESR: Chronic Phase) No results found for: "CRP", "ESRSEDRATE", "LATICACIDVEN"       Note: Above Lab results reviewed.  Recent Imaging Review  DG Lumbar Spine Complete W/Bend CLINICAL DATA:  Pain  EXAM: LUMBAR SPINE - COMPLETE WITH BENDING VIEWS  COMPARISON:  None Available.  FINDINGS: An IUD is identified in the pelvis. 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 No loss of disc height. No fracture or malalignment. No other bony or soft tissue abnormalities identified.  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  Electronically Signed   By: Gerome Sam III M.D.   On: 03/18/2023 13:51 DG Thoracic Spine 2 View CLINICAL DATA:  Pain  EXAM: THORACIC SPINE 2 VIEWS  COMPARISON:  None Available.  FINDINGS: Mild multilevel degenerative disc disease with small anterior osteophytes but no loss of disc height. No fracture or significant malalignment.  IMPRESSION: Mild multilevel degenerative disc disease.  Electronically Signed   By: Gerome Sam III M.D.   On: 03/18/2023 13:50 Note: Reviewed        Physical Exam  General appearance: Well nourished, well developed, and well hydrated. In no apparent acute distress Mental status: Alert, oriented x 3 (person, place, & time)       Respiratory: No evidence of acute respiratory distress Eyes: PERLA Vitals: BP (!) 119/50   Pulse 89   Temp 97.6 F (36.4 C) (Temporal)   Resp 17   Ht 5\' 3"  (1.6 m)   Wt 185 lb (83.9 kg)   LMP  (LMP Unknown)   SpO2 100%   BMI 32.77 kg/m  BMI: Estimated body mass index is 32.77 kg/m as calculated  from the following:   Height as of this encounter: 5\' 3"  (1.6 m).   Weight as of this encounter: 185 lb (83.9 kg). Ideal: Ideal body weight: 52.4 kg (115 lb 8.3 oz) Adjusted ideal body weight: 65 kg (143 lb 5 oz)   Thoracic Spine Area Exam  Skin & Axial Inspection: No masses, redness, or swelling Alignment: Symmetrical Functional ROM: Unrestricted ROM Stability: No instability detected Muscle Tone/Strength: Functionally intact. No obvious neuro-muscular anomalies detected. Sensory (Neurological): Musculoskeletal pain pattern Muscle strength & Tone: No palpable anomalies Lumbar Spine Area Exam  Skin & Axial Inspection: No masses, redness, or swelling Alignment: Symmetrical Functional ROM: Unrestricted ROM       Stability: No instability detected Muscle Tone/Strength: Functionally intact. No obvious neuro-muscular anomalies detected. Sensory (Neurological): Unimpaired  Gait & Posture Assessment  Ambulation: Unassisted Gait: Relatively normal for age and body habitus Posture: WNL  Lower Extremity Exam    Side: Right lower extremity  Side: Left lower extremity  Stability: No instability observed          Stability: No instability observed          Skin & Extremity Inspection: Skin color, temperature, and hair growth are WNL. No peripheral edema or cyanosis. No masses, redness, swelling, asymmetry, or associated skin lesions. No contractures.  Skin & Extremity Inspection: Skin color, temperature, and hair growth are WNL. No peripheral edema or cyanosis. No  masses, redness, swelling, asymmetry, or associated skin lesions. No contractures.  Functional ROM: Unrestricted ROM                  Functional ROM: Unrestricted ROM                  Muscle Tone/Strength: Functionally intact. No obvious neuro-muscular anomalies detected.  Muscle Tone/Strength: Functionally intact. No obvious neuro-muscular anomalies detected.  Sensory (Neurological): Unimpaired        Sensory (Neurological):  Unimpaired        DTR: Patellar: deferred today Achilles: deferred today Plantar: deferred today  DTR: Patellar: deferred today Achilles: deferred today Plantar: deferred today  Palpation: No palpable anomalies  Palpation: No palpable anomalies   Assessment   Diagnosis Status  1. Bilateral occipital neuralgia   2. Chronic bilateral low back pain without sciatica   3. Degenerative cervical disc   4. Chronic midline thoracic back pain    Controlled Controlled Controlled   Updated Problems: No problems updated.  Plan of Care  Problem-specific:  No problem-specific Assessment & Plan notes found for this encounter.  Ms. CYBELLE GARVIS has a current medication list which includes the following long-term medication(s): amphetamine-dextroamphetamine, venlafaxine xr, pregabalin, [DISCONTINUED] fluoxetine, [DISCONTINUED] promethazine, and [DISCONTINUED] simethicone.  Pharmacotherapy (Medications Ordered): Meds ordered this encounter  Medications   pregabalin (LYRICA) 25 MG capsule    Sig: Take 2 capsules (50 mg total) by mouth 2 (two) times daily.    Dispense:  120 capsule    Refill:  2    Fill one day early if pharmacy is closed on scheduled refill date. May substitute for generic if available.   Future considerations include occipital nerve block  Follow-up plan:   Return in about 3 months (around 07/21/2023) for Medication Management, in person.    Recent Visits No visits were found meeting these conditions. Showing recent visits within past 90 days and meeting all other requirements Today's Visits Date Type Provider Dept  04/20/23 Office Visit Edward Jolly, MD [facility]  Showing today's visits and meeting all other requirements Future Appointments Date Type Provider Dept  07/15/23 Appointment Edward Jolly, MD [facility]  Showing future appointments within next 90 days and meeting all other requirements  I discussed the assessment and  treatment plan with the patient. The patient was provided an opportunity to ask questions and all were answered. The patient agreed with the plan and demonstrated an understanding of the instructions.  Patient advised to call back or seek an in-person evaluation if the symptoms or condition worsens.  Duration of encounter: .  Total time on encounter, as per AMA guidelines included both the face-to-face and non-face-to-face time personally spent by the physician and/or other qualified health care professional(s) on the day of the encounter (includes time in activities that require the physician or other qualified health care professional and does not include time in activities normally performed by clinical staff). Physician's time may include the following activities when performed: Preparing to see the patient (e.g., pre-charting review of records, searching for previously ordered imaging, lab work, and nerve conduction tests) Review of prior analgesic pharmacotherapies. Reviewing PMP Interpreting ordered tests (e.g., lab work, imaging, nerve conduction tests) Performing post-procedure evaluations, including interpretation of diagnostic procedures Obtaining and/or reviewing separately obtained history Performing a medically appropriate examination and/or evaluation Counseling and educating the patient/family/caregiver Ordering medications, tests, or procedures Referring and communicating with other health care professionals (when not separately reported) Documenting clinical information in  the electronic or other health record Independently interpreting results (not separately reported) and communicating results to the patient/ family/caregiver Care coordination (not separately reported)  Note by: Edward Jolly, MD Date: 04/20/2023; Time: 11:21 AM

## 2023-05-05 ENCOUNTER — Other Ambulatory Visit (HOSPITAL_COMMUNITY): Payer: Self-pay

## 2023-05-05 DIAGNOSIS — F411 Generalized anxiety disorder: Secondary | ICD-10-CM | POA: Diagnosis not present

## 2023-05-05 DIAGNOSIS — D519 Vitamin B12 deficiency anemia, unspecified: Secondary | ICD-10-CM | POA: Diagnosis not present

## 2023-05-05 DIAGNOSIS — F5105 Insomnia due to other mental disorder: Secondary | ICD-10-CM | POA: Diagnosis not present

## 2023-05-05 DIAGNOSIS — F330 Major depressive disorder, recurrent, mild: Secondary | ICD-10-CM | POA: Diagnosis not present

## 2023-05-05 DIAGNOSIS — F908 Attention-deficit hyperactivity disorder, other type: Secondary | ICD-10-CM | POA: Diagnosis not present

## 2023-05-05 MED ORDER — AMPHETAMINE-DEXTROAMPHET ER 30 MG PO CP24
30.0000 mg | ORAL_CAPSULE | Freq: Every day | ORAL | 0 refills | Status: DC
  Filled 2023-05-05: qty 30, 30d supply, fill #0

## 2023-05-05 MED ORDER — VENLAFAXINE HCL ER 75 MG PO CP24
75.0000 mg | ORAL_CAPSULE | Freq: Every day | ORAL | 0 refills | Status: DC
  Filled 2023-05-05 – 2023-06-10 (×2): qty 30, 30d supply, fill #0

## 2023-05-17 ENCOUNTER — Encounter: Payer: Self-pay | Admitting: Family Medicine

## 2023-05-17 ENCOUNTER — Ambulatory Visit (INDEPENDENT_AMBULATORY_CARE_PROVIDER_SITE_OTHER): Payer: 59 | Admitting: Family Medicine

## 2023-05-17 ENCOUNTER — Other Ambulatory Visit: Payer: Self-pay | Admitting: Oncology

## 2023-05-17 ENCOUNTER — Other Ambulatory Visit (HOSPITAL_COMMUNITY): Payer: Self-pay

## 2023-05-17 VITALS — BP 120/70 | HR 95 | Ht 63.0 in | Wt 185.0 lb

## 2023-05-17 DIAGNOSIS — R5383 Other fatigue: Secondary | ICD-10-CM | POA: Diagnosis not present

## 2023-05-17 DIAGNOSIS — D519 Vitamin B12 deficiency anemia, unspecified: Secondary | ICD-10-CM

## 2023-05-17 DIAGNOSIS — Z0000 Encounter for general adult medical examination without abnormal findings: Secondary | ICD-10-CM | POA: Diagnosis not present

## 2023-05-17 DIAGNOSIS — Z006 Encounter for examination for normal comparison and control in clinical research program: Secondary | ICD-10-CM

## 2023-05-17 NOTE — Progress Notes (Signed)
Date:  05/17/2023   Name:  WESTLYNN VOWELS   DOB:  22-Feb-1996   MRN:  [id number]   Chief Complaint: Annual Exam  Patient is a 27 year old female who presents for a comprehensive physical exam. The patient reports the following problems: none. Health maintenance has been reviewed up to date.      Lab Results  Component Value Date   NA 139 05/12/2022   K 4.0 05/12/2022   CO2 22 05/12/2022   GLUCOSE 84 05/12/2022   BUN 12 05/12/2022   CREATININE 0.60 05/12/2022   CALCIUM 9.1 05/12/2022   EGFR 126 05/12/2022   GFRNONAA 129 05/01/2020   Lab Results  Component Value Date   CHOL 164 05/12/2022   HDL 48 05/12/2022   LDLCALC 105 (H) 05/12/2022   TRIG 52 05/12/2022   Lab Results  Component Value Date   TSH 2.310 05/12/2022   Lab Results  Component Value Date   HGBA1C 5.0 05/12/2022   Lab Results  Component Value Date   WBC 4.1 05/12/2022   HGB 14.6 05/12/2022   HCT 43.0 05/12/2022   MCV 90 05/12/2022   PLT 192 05/12/2022   Lab Results  Component Value Date   ALT 16 05/12/2022   AST 15 05/12/2022   ALKPHOS 70 05/12/2022   BILITOT 0.5 05/12/2022   No results found for: "25OHVITD2", "25OHVITD3", "VD25OH"   Review of Systems  Constitutional: Negative.  Negative for chills, fatigue, fever and unexpected weight change.  HENT:  Negative for congestion, ear discharge, ear pain, rhinorrhea, sinus pressure, sneezing, sore throat and trouble swallowing.   Eyes:  Negative for visual disturbance.  Respiratory:  Negative for cough, chest tightness, shortness of breath, wheezing and stridor.   Cardiovascular:  Negative for chest pain and palpitations.  Gastrointestinal:  Negative for abdominal pain, blood in stool, constipation, diarrhea and nausea.  Genitourinary:  Negative for dysuria, flank pain, frequency, hematuria, urgency and vaginal discharge.  Musculoskeletal:  Negative for arthralgias, back pain and myalgias.  Skin:  Negative for rash.  Neurological:  Negative  for dizziness, weakness and headaches.  Hematological:  Negative for adenopathy. Does not bruise/bleed easily.  Psychiatric/Behavioral:  Negative for dysphoric mood. The patient is not nervous/anxious.     Patient Active Problem List   Diagnosis Date Noted   Bilateral occipital neuralgia 03/18/2023   Chronic bilateral low back pain without sciatica 03/18/2023   Degenerative cervical disc 03/18/2023   Disorder of rotator cuff, right 01/11/2023   Right cervical radiculopathy 01/11/2023   Cesarean delivery delivered - Repeat 5/24 03/04/2018   Postpartum care following vaginal delivery 03/04/2018   Seizure-like activity (HCC) 11/30/2017   Impaired glucose tolerance test 09/23/2017   Chronic pelvic pain in female 02/01/2012    Allergies  Allergen Reactions   Hydrocodone Hives and Shortness Of Breath   Aspirin Hives    Pt states that she can tolerate ibuprofen   Pollen Extract Other (See Comments)    Seasonal allergy   Shellfish Allergy Nausea And Vomiting   Zoloft [Sertraline Hcl]    Mold Extract [Trichophyton Mentagrophyte] Itching and Rash    Past Surgical History:  Procedure Laterality Date   CESAREAN SECTION     CESAREAN SECTION N/A 03/04/2018   Procedure: Repeat CESAREAN SECTION;  Surgeon: Vick Frees, MD;  Location: [facility];  Service: Obstetrics;  Laterality: N/A;  EDD: 03/10/18 Allergy: Aspirin, Shellfish   CHOLECYSTECTOMY     LAPAROSCOPY  2012   diagnose endometriosis  TONSILLECTOMY      Social History   Tobacco Use   Smoking status: Never   Smokeless tobacco: Never  Vaping Use   Vaping status: Never Used  Substance Use Topics   Alcohol use: Yes    Alcohol/week: 2.0 standard drinks of alcohol    Types: 2 Glasses of wine per week   Drug use: No     Medication list has been reviewed and updated.  Current Meds  Medication Sig   amphetamine-dextroamphetamine (ADDERALL XR) 20 MG 24 hr capsule Take 2 capsules (40 mg total) by mouth daily.    pregabalin (LYRICA) 25 MG capsule Take 2 capsules (50 mg total) by mouth 2 (two) times daily.   venlafaxine XR (EFFEXOR XR) 75 MG 24 hr capsule Take 1 capsule (75 mg total) by mouth daily with breakfast.       05/17/2023    9:47 AM 05/17/2023    9:45 AM 12/21/2022    7:58 AM 10/09/2022   11:50 AM  GAD 7 : Generalized Anxiety Score  Nervous, Anxious, on Edge 0 0 0 0  Control/stop worrying 0 0 0 0  Worry too much - different things 0 0 0 0  Trouble relaxing 0 0 0 0  Restless 0 0 0 0  Easily annoyed or irritable 0 0 0 0  Afraid - awful might happen 0 0 0 0  Total GAD 7 Score 0 0 0 0  Anxiety Difficulty Not difficult at all Not difficult at all Not difficult at all Not difficult at all       05/17/2023    9:46 AM 05/17/2023    9:45 AM 12/21/2022    7:58 AM  Depression screen PHQ 2/9  Decreased Interest 0 0 0  Down, Depressed, Hopeless 0 0 0  PHQ - 2 Score 0 0 0  Altered sleeping 0 0 0  Tired, decreased energy 0 0 0  Change in appetite 0 0 0  Feeling bad or failure about yourself  0 0 0  Trouble concentrating 0 0 0  Moving slowly or fidgety/restless 0 0 0  Suicidal thoughts 0 0 0  PHQ-9 Score 0 0 0  Difficult doing work/chores Not difficult at all Not difficult at all Not difficult at all    BP Readings from Last 3 Encounters:  05/17/23 120/70  04/20/23 (!) 119/50  03/18/23 113/76    Physical Exam Vitals and nursing note reviewed. Exam conducted with a chaperone present.  Constitutional:      General: She is not in acute distress.    Appearance: She is not diaphoretic.  HENT:     Head: Normocephalic and atraumatic.     Right Ear: Tympanic membrane and external ear normal.     Left Ear: Tympanic membrane and external ear normal.     Nose: Nose normal. No congestion or rhinorrhea.     Mouth/Throat:     Mouth: Mucous membranes are moist.     Pharynx: No oropharyngeal exudate or posterior oropharyngeal erythema.  Eyes:     General:        Right eye: No discharge.         Left eye: No discharge.     Conjunctiva/sclera: Conjunctivae normal.     Pupils: Pupils are equal, round, and reactive to light.  Neck:     Thyroid: No thyromegaly.     Vascular: No carotid bruit or JVD.  Cardiovascular:     Rate and Rhythm: Normal rate and regular rhythm.  Pulses:          Carotid pulses are 2+ on the right side and 2+ on the left side.      Radial pulses are 2+ on the right side and 2+ on the left side.       Femoral pulses are 2+ on the right side and 2+ on the left side.      Popliteal pulses are 2+ on the right side and 2+ on the left side.       Dorsalis pedis pulses are 2+ on the right side and 2+ on the left side.       Posterior tibial pulses are 2+ on the right side and 2+ on the left side.     Heart sounds: Normal heart sounds. No murmur heard.    No friction rub. No gallop.  Pulmonary:     Effort: Pulmonary effort is normal.     Breath sounds: Normal breath sounds. No decreased breath sounds, wheezing, rhonchi or rales.  Abdominal:     General: Bowel sounds are normal.     Palpations: Abdomen is soft. There is no mass.     Tenderness: There is no abdominal tenderness. There is no guarding or rebound.  Musculoskeletal:        General: Normal range of motion.     Cervical back: Normal range of motion and neck supple.     Right lower leg: No edema.     Left lower leg: No edema.  Lymphadenopathy:     Cervical: No cervical adenopathy.  Skin:    General: Skin is warm and dry.  Neurological:     Mental Status: She is alert.     Motor: No weakness.     Deep Tendon Reflexes: Reflexes are normal and symmetric.     Wt Readings from Last 3 Encounters:  05/17/23 185 lb (83.9 kg)  04/20/23 185 lb (83.9 kg)  03/18/23 176 lb (79.8 kg)    BP 120/70   Pulse 95   Ht 5\' 3"  (1.6 m)   Wt 185 lb (83.9 kg)   LMP  (LMP Unknown)   SpO2 99%   BMI 32.77 kg/m   Assessment and Plan:  Dahiana R Simmering is a 27 y.o. female who presents today for her Complete  Annual Exam. She feels fairly well. She reports exercising . She reports she is sleeping fairly well.  Immunizations are reviewed and recommendations provided.   Age appropriate screening tests are discussed. Counseling given for risk factor reduction interventions.  1. Annual physical exam No subjective/objective concerns noted during HPI, review of past medical history and meds, review of systems and physical exam.  Will check CMP CBC lipid panel for baseline's. - Comprehensive metabolic panel - CBC with Differential/Platelet - Lipid Panel With LDL/HDL Ratio - Thyroid Panel With TSH  2. Fatigue, unspecified type Chronic.  Episodic.  Stable.  Patient has increasing fatigue and we will check thyroid panel with TSH to see if in early stages of thyroid concerns given the family history. - Thyroid Panel With TSH  3. Anemia due to vitamin B12 deficiency, unspecified B12 deficiency type Patient with history of B12 deficiency we will check CBC since she is no longer taking supplemental B12 to see if there is any resulting anemic reflected by her and will compensate as needed. - CBC with Differential/Platelet - Thyroid Panel With TSH   Elizabeth Sauer, MD

## 2023-06-02 ENCOUNTER — Other Ambulatory Visit (HOSPITAL_COMMUNITY)
Admission: RE | Admit: 2023-06-02 | Discharge: 2023-06-02 | Disposition: A | Discharge status: Home / Self Care | Payer: 59 | Source: Ambulatory Visit | Attending: Oncology | Admitting: Oncology

## 2023-06-02 DIAGNOSIS — Z006 Encounter for examination for normal comparison and control in clinical research program: Secondary | ICD-10-CM | POA: Insufficient documentation

## 2023-06-09 DIAGNOSIS — Z01411 Encounter for gynecological examination (general) (routine) with abnormal findings: Secondary | ICD-10-CM | POA: Diagnosis not present

## 2023-06-09 DIAGNOSIS — T8332XD Displacement of intrauterine contraceptive device, subsequent encounter: Secondary | ICD-10-CM | POA: Diagnosis not present

## 2023-06-10 ENCOUNTER — Other Ambulatory Visit (HOSPITAL_COMMUNITY): Payer: Self-pay

## 2023-06-11 ENCOUNTER — Other Ambulatory Visit (HOSPITAL_COMMUNITY): Payer: Self-pay

## 2023-06-11 ENCOUNTER — Other Ambulatory Visit: Payer: Self-pay

## 2023-06-11 MED ORDER — AMPHETAMINE-DEXTROAMPHETAMINE 10 MG PO TABS
10.0000 mg | ORAL_TABLET | Freq: Every day | ORAL | 0 refills | Status: DC
Start: 2023-06-10 — End: 2023-07-27
  Filled 2023-06-11: qty 90, 90d supply, fill #0

## 2023-06-11 MED ORDER — AMPHETAMINE-DEXTROAMPHET ER 30 MG PO CP24
30.0000 mg | ORAL_CAPSULE | Freq: Every day | ORAL | 0 refills | Status: DC
Start: 2023-06-10 — End: 2023-07-29
  Filled 2023-06-11: qty 90, 90d supply, fill #0

## 2023-06-12 LAB — GENECONNECT MOLECULAR SCREEN: Genetic Analysis Overall Interpretation: NEGATIVE

## 2023-06-15 ENCOUNTER — Other Ambulatory Visit (HOSPITAL_COMMUNITY): Payer: Self-pay

## 2023-07-15 ENCOUNTER — Encounter: Payer: Self-pay | Admitting: Student in an Organized Health Care Education/Training Program

## 2023-07-15 ENCOUNTER — Other Ambulatory Visit (HOSPITAL_COMMUNITY): Payer: Self-pay

## 2023-07-15 ENCOUNTER — Ambulatory Visit
Payer: 59 | Attending: Student in an Organized Health Care Education/Training Program | Admitting: Student in an Organized Health Care Education/Training Program

## 2023-07-15 VITALS — BP 108/64 | HR 57 | Temp 98.2°F | Resp 16 | Ht 63.0 in | Wt 180.0 lb

## 2023-07-15 DIAGNOSIS — G8929 Other chronic pain: Secondary | ICD-10-CM

## 2023-07-15 DIAGNOSIS — M5481 Occipital neuralgia: Secondary | ICD-10-CM

## 2023-07-15 DIAGNOSIS — G43E09 Chronic migraine with aura, not intractable, without status migrainosus: Secondary | ICD-10-CM | POA: Diagnosis not present

## 2023-07-15 DIAGNOSIS — M546 Pain in thoracic spine: Secondary | ICD-10-CM | POA: Diagnosis not present

## 2023-07-15 MED ORDER — SUMATRIPTAN SUCCINATE 50 MG PO TABS
50.0000 mg | ORAL_TABLET | ORAL | 1 refills | Status: DC | PRN
  Filled 2023-07-15: qty 9, 30d supply, fill #0

## 2023-07-15 MED ORDER — PREGABALIN 50 MG PO CAPS
50.0000 mg | ORAL_CAPSULE | Freq: Three times a day (TID) | ORAL | 2 refills | Status: DC
Start: 2023-07-15 — End: 2023-09-07
  Filled 2023-07-15: qty 90, 30d supply, fill #0

## 2023-07-15 NOTE — Progress Notes (Signed)
Safety precautions to be maintained throughout the outpatient stay will include: orient to surroundings, keep bed in low position, maintain call bell within reach at all times, provide assistance with transfer out of bed and ambulation.  

## 2023-07-15 NOTE — Progress Notes (Signed)
PROVIDER NOTE: Information contained herein reflects review and annotations entered in association with encounter. Interpretation of such information and data should be left to medically-trained personnel. Information provided to patient can be located elsewhere in the medical record under "Patient Instructions". Document created using STT-dictation technology, any transcriptional errors that may result from process are unintentional.    Patient: [name]  Service Category: E/M  Provider: Edward Jolly, MD  DOB: [birth year]  DOS: 07/15/2023  Referring Provider: Duanne Limerick, MD  MRN: [id number]  Specialty: Interventional Pain Management  PCP: Duanne Limerick, MD  Type: Established Patient  Setting: Ambulatory outpatient    Location: Office  Delivery: Face-to-face     HPI  [name], a 27 y.o. year old female, is here today because of her Bilateral occipital neuralgia [M54.81]. Ms. Hunsberger primary complain today is Back Pain Pain Assessment: Severity of Chronic pain is reported as a 6 /10. Location: Back Mid, Upper/NEW up to neck, now migraines lasting hours each that are becoming more frequent. Onset: More than a month ago. Quality: Sharp. Timing: Intermittent. Modifying factor(s): still taking meds, "not sure if meds are helping". Vitals:  height is 5\' 3"  (1.6 m) and weight is 180 lb (81.6 kg). Her temperature is 98.2 F (36.8 C). Her blood pressure is 108/64 and her pulse is 57 (abnormal). Her respiration is 16 and oxygen saturation is 100%.  BMI: Estimated body mass index is 31.89 kg/m as calculated from the following:   Height as of this encounter: 5\' 3"  (1.6 m).   Weight as of this encounter: 180 lb (81.6 kg). Last encounter: 04/20/23 Last procedure: Visit date not found.  Reason for encounter: follow-up evaluation.   Patient continues to struggle with occipital neuralgia and states that she has been having more migraines per week.  She is endorsing approximately 3-4  migraine days a week. She states that the Lyrica has become less effective.  She is not endorsing any side effects with Lyrica    ROS  Constitutional: Denies any fever or chills Gastrointestinal: No reported hemesis, hematochezia, vomiting, or acute GI distress Musculoskeletal: Midthoracic pain Neurological:  Occipital neuralgia and migraines  Medication Review  FLUoxetine, SUMAtriptan, amphetamine-dextroamphetamine, pregabalin, promethazine, simethicone, and venlafaxine XR  History Review  Allergy: Ms. Schweers is allergic to hydrocodone, aspirin, pollen extract, shellfish allergy, zoloft [sertraline hcl], and mold extract [trichophyton mentagrophyte]. Drug: Ms. Bonet  reports no history of drug use. Alcohol:  reports current alcohol use of about 2.0 standard drinks of alcohol per week. Tobacco:  reports that she has never smoked. She has never used smokeless tobacco. Social: Ms. Armas  reports that she has never smoked. She has never used smokeless tobacco. She reports current alcohol use of about 2.0 standard drinks of alcohol per week. She reports that she does not use drugs. Medical:  has a past medical history of Endometriosis, Gestational diabetes, Otitis media, and Seizures (HCC). Surgical: Ms. Stenberg  has a past surgical history that includes Cesarean section; Cholecystectomy; Tonsillectomy; laparoscopy (2012); and Cesarean section (N/A, 03/04/2018). Family: family history includes Diabetes in her mother; Healthy in her father and mother; Hearing loss in her mother.  Laboratory Chemistry Profile   Renal Lab Results  Component Value Date   BUN 7 05/17/2023   CREATININE 0.61 05/17/2023   BCR 11 05/17/2023   GFRAA 148 05/01/2020   GFRNONAA 129 05/01/2020    Hepatic Lab Results  Component Value Date   AST 15 05/17/2023   ALT 19  05/17/2023   ALBUMIN 4.3 05/17/2023   ALKPHOS 71 05/17/2023   LIPASE 143 09/19/2013    Electrolytes Lab Results  Component Value Date   NA 140  05/17/2023   K 3.7 05/17/2023   CL 104 05/17/2023   CALCIUM 9.2 05/17/2023   PHOS 3.7 01/16/2020    Bone No results found for: "VD25OH", "VD125OH2TOT", "HY8657QI6", "NG2952WU1", "25OHVITD1", "25OHVITD2", "25OHVITD3", "TESTOFREE", "TESTOSTERONE"  Inflammation (CRP: Acute Phase) (ESR: Chronic Phase) No results found for: "CRP", "ESRSEDRATE", "LATICACIDVEN"       Note: Above Lab results reviewed.  Recent Imaging Review  DG Lumbar Spine Complete W/Bend CLINICAL DATA:  Pain  EXAM: LUMBAR SPINE - COMPLETE WITH BENDING VIEWS  COMPARISON:  None Available.  FINDINGS: An IUD is identified in the pelvis. 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 No loss of disc height. No fracture or malalignment. No other bony or soft tissue abnormalities identified.  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  Electronically Signed   By: Gerome Sam III M.D.   On: 03/18/2023 13:51 DG Thoracic Spine 2 View CLINICAL DATA:  Pain  EXAM: THORACIC SPINE 2 VIEWS  COMPARISON:  None Available.  FINDINGS: Mild multilevel degenerative disc disease with small anterior osteophytes but no loss of disc height. No fracture or significant malalignment.  IMPRESSION: Mild multilevel degenerative disc disease.  Electronically Signed   By: Gerome Sam III M.D.   On: 03/18/2023 13:50 Note: Reviewed        Physical Exam  General appearance: Well nourished, well developed, and well hydrated. In no apparent acute distress Mental status: Alert, oriented x 3 (person, place, & time)       Respiratory: No evidence of acute respiratory distress Eyes: PERLA Vitals: BP 108/64   Pulse (!) 57   Temp 98.2 F (36.8 C)   Resp 16   Ht 5\' 3"  (1.6 m)   Wt 180 lb (81.6 kg)   SpO2 100%   BMI 31.89 kg/m  BMI: Estimated body mass index is 31.89 kg/m as calculated from the following:   Height as of this encounter: 5\' 3"  (1.6  m).   Weight as of this encounter: 180 lb (81.6 kg). Ideal: Ideal body weight: 52.4 kg (115 lb 8.3 oz) Adjusted ideal body weight: 64.1 kg (141 lb 5 oz)  Occipital neuralgia  Thoracic Spine Area Exam  Skin & Axial Inspection: No masses, redness, or swelling Alignment: Symmetrical Functional ROM: Unrestricted ROM Stability: No instability detected Muscle Tone/Strength: Functionally intact. No obvious neuro-muscular anomalies detected. Sensory (Neurological): Musculoskeletal pain pattern Muscle strength & Tone: No palpable anomalies Lumbar Spine Area Exam  Skin & Axial Inspection: No masses, redness, or swelling Alignment: Symmetrical Functional ROM: Unrestricted ROM       Stability: No instability detected Muscle Tone/Strength: Functionally intact. No obvious neuro-muscular anomalies detected. Sensory (Neurological): Unimpaired  Gait & Posture Assessment  Ambulation: Unassisted Gait: Relatively normal for age and body habitus Posture: WNL  Lower Extremity Exam    Side: Right lower extremity  Side: Left lower extremity  Stability: No instability observed          Stability: No instability observed          Skin & Extremity Inspection: Skin color, temperature, and hair growth are WNL. No peripheral edema or cyanosis. No masses, redness, swelling, asymmetry, or associated skin lesions. No contractures.  Skin & Extremity Inspection: Skin color, temperature, and hair growth are WNL. No peripheral edema  or cyanosis. No masses, redness, swelling, asymmetry, or associated skin lesions. No contractures.  Functional ROM: Unrestricted ROM                  Functional ROM: Unrestricted ROM                  Muscle Tone/Strength: Functionally intact. No obvious neuro-muscular anomalies detected.  Muscle Tone/Strength: Functionally intact. No obvious neuro-muscular anomalies detected.  Sensory (Neurological): Unimpaired        Sensory (Neurological): Unimpaired        DTR: Patellar: deferred  today Achilles: deferred today Plantar: deferred today  DTR: Patellar: deferred today Achilles: deferred today Plantar: deferred today  Palpation: No palpable anomalies  Palpation: No palpable anomalies   Assessment   Diagnosis Status  1. Bilateral occipital neuralgia   2. Chronic midline thoracic back pain   3. Chronic migraine with aura without status migrainosus, not intractable     Having a Flare-up Controlled Having a Flare-up   Updated Problems: No problems updated.  Plan of Care  Problem-specific:  No problem-specific Assessment & Plan notes found for this encounter.  Ms. AIREONNA BAUER has a current medication list which includes the following long-term medication(s): amphetamine-dextroamphetamine, amphetamine-dextroamphetamine, amphetamine-dextroamphetamine, pregabalin, sumatriptan, venlafaxine xr, [DISCONTINUED] fluoxetine, [DISCONTINUED] promethazine, and [DISCONTINUED] simethicone.  Pharmacotherapy (Medications Ordered): Meds ordered this encounter  Medications   pregabalin (LYRICA) 50 MG capsule    Sig: Take 1 capsule (50 mg total) by mouth 3 (three) times daily.    Dispense:  90 capsule    Refill:  2    Fill one day early if pharmacy is closed on scheduled refill date. May substitute for generic if available.   SUMAtriptan (IMITREX) 50 MG tablet    Sig: Take 1 tablet (50 mg total) by mouth at onset of migraine. May repeat in 2 hours if headache persists or recurs.    Dispense:  30 tablet    Refill:  1   Future considerations include occipital nerve block  Follow-up plan:   Return in about 8 weeks (around 09/09/2023) for MM, F2F.    Recent Visits Date Type Provider Dept  04/20/23 Office Visit Edward Jolly, MD [facility]  Showing recent visits within past 90 days and meeting all other requirements Today's Visits Date Type Provider Dept  07/15/23 Office Visit Edward Jolly, MD [facility]  Showing today's visits and meeting all  other requirements Future Appointments Date Type Provider Dept  09/07/23 Appointment Edward Jolly, MD [facility]  Showing future appointments within next 90 days and meeting all other requirements  I discussed the assessment and treatment plan with the patient. The patient was provided an opportunity to ask questions and all were answered. The patient agreed with the plan and demonstrated an understanding of the instructions.  Patient advised to call back or seek an in-person evaluation if the symptoms or condition worsens.  Duration of encounter: .  Total time on encounter, as per AMA guidelines included both the face-to-face and non-face-to-face time personally spent by the physician and/or other qualified health care professional(s) on the day of the encounter (includes time in activities that require the physician or other qualified health care professional and does not include time in activities normally performed by clinical staff). Physician's time may include the following activities when performed: Preparing to see the patient (e.g., pre-charting review of records, searching for previously ordered imaging, lab work, and nerve conduction tests) Review of prior analgesic pharmacotherapies.  Reviewing PMP Interpreting ordered tests (e.g., lab work, imaging, nerve conduction tests) Performing post-procedure evaluations, including interpretation of diagnostic procedures Obtaining and/or reviewing separately obtained history Performing a medically appropriate examination and/or evaluation Counseling and educating the patient/family/caregiver Ordering medications, tests, or procedures Referring and communicating with other health care professionals (when not separately reported) Documenting clinical information in the electronic or other health record Independently interpreting results (not separately reported) and communicating results to the patient/  family/caregiver Care coordination (not separately reported)  Note by: Edward Jolly, MD Date: 07/15/2023; Time: 9:23 AM

## 2023-07-21 ENCOUNTER — Ambulatory Visit: Payer: 59 | Admitting: Family Medicine

## 2023-07-27 ENCOUNTER — Other Ambulatory Visit (INDEPENDENT_AMBULATORY_CARE_PROVIDER_SITE_OTHER): Payer: 59 | Admitting: Radiology

## 2023-07-27 ENCOUNTER — Encounter: Payer: Self-pay | Admitting: Family Medicine

## 2023-07-27 ENCOUNTER — Ambulatory Visit (INDEPENDENT_AMBULATORY_CARE_PROVIDER_SITE_OTHER): Payer: 59 | Admitting: Family Medicine

## 2023-07-27 VITALS — BP 100/62 | Temp 87.0°F | Ht 63.0 in | Wt 188.0 lb

## 2023-07-27 DIAGNOSIS — M5442 Lumbago with sciatica, left side: Secondary | ICD-10-CM | POA: Diagnosis not present

## 2023-07-27 DIAGNOSIS — M5441 Lumbago with sciatica, right side: Secondary | ICD-10-CM | POA: Diagnosis not present

## 2023-07-27 DIAGNOSIS — G8929 Other chronic pain: Secondary | ICD-10-CM | POA: Diagnosis not present

## 2023-07-27 DIAGNOSIS — M67911 Unspecified disorder of synovium and tendon, right shoulder: Secondary | ICD-10-CM | POA: Diagnosis not present

## 2023-07-27 DIAGNOSIS — M47814 Spondylosis without myelopathy or radiculopathy, thoracic region: Secondary | ICD-10-CM | POA: Diagnosis not present

## 2023-07-27 MED ORDER — TRIAMCINOLONE ACETONIDE 40 MG/ML IJ SUSP
40.0000 mg | Freq: Once | INTRAMUSCULAR | Status: AC
Start: 2023-07-27 — End: 2023-07-27
  Administered 2023-07-27: 40 mg via INTRAMUSCULAR

## 2023-07-29 ENCOUNTER — Other Ambulatory Visit: Payer: Self-pay

## 2023-07-29 ENCOUNTER — Other Ambulatory Visit (HOSPITAL_COMMUNITY): Payer: Self-pay

## 2023-07-29 DIAGNOSIS — D519 Vitamin B12 deficiency anemia, unspecified: Secondary | ICD-10-CM | POA: Diagnosis not present

## 2023-07-29 DIAGNOSIS — F908 Attention-deficit hyperactivity disorder, other type: Secondary | ICD-10-CM | POA: Diagnosis not present

## 2023-07-29 DIAGNOSIS — F411 Generalized anxiety disorder: Secondary | ICD-10-CM | POA: Diagnosis not present

## 2023-07-29 DIAGNOSIS — F5105 Insomnia due to other mental disorder: Secondary | ICD-10-CM | POA: Diagnosis not present

## 2023-07-29 DIAGNOSIS — F330 Major depressive disorder, recurrent, mild: Secondary | ICD-10-CM | POA: Diagnosis not present

## 2023-07-29 MED ORDER — DULOXETINE HCL 30 MG PO CPEP
ORAL_CAPSULE | ORAL | 0 refills | Status: DC
Start: 2023-07-29 — End: 2023-10-26
  Filled 2023-07-29: qty 30, 17d supply, fill #0

## 2023-07-29 MED ORDER — AMPHETAMINE-DEXTROAMPHET ER 30 MG PO CP24
30.0000 mg | ORAL_CAPSULE | Freq: Every day | ORAL | 0 refills | Status: DC
Start: 2023-07-29 — End: 2023-12-03
  Filled 2023-11-16: qty 90, 90d supply, fill #0

## 2023-07-29 MED ORDER — AMPHETAMINE-DEXTROAMPHETAMINE 10 MG PO TABS: 10.0000 mg | ORAL_TABLET | Freq: Every day | ORAL | 0 refills | Status: DC

## 2023-07-31 MED ORDER — MELOXICAM 15 MG PO TABS
15.0000 mg | ORAL_TABLET | Freq: Every day | ORAL | 0 refills | Status: DC
Start: 2023-07-31 — End: 2023-09-01

## 2023-07-31 NOTE — Assessment & Plan Note (Signed)
Return for follow-up to [facility] management given progress. Describes recurrence despite CSI months prior. Has performed home exercises with limited response.

## 2023-08-02 DIAGNOSIS — M5134 Other intervertebral disc degeneration, thoracic region: Secondary | ICD-10-CM | POA: Insufficient documentation

## 2023-08-02 DIAGNOSIS — M47814 Spondylosis without myelopathy or radiculopathy, thoracic region: Secondary | ICD-10-CM | POA: Insufficient documentation

## 2023-08-02 NOTE — Progress Notes (Signed)
Primary Care / Sports Medicine Office Visit  Patient Information:  Patient ID: [name], female DOB: 1996/04/28 Age: 27 y.o. MRN: [id number]   [name] is a pleasant 27 y.o. female presenting with the following:  Chief Complaint  Patient presents with   Shoulder Pain    X 1 weeks, unchanged, right, aching, 4, ibuprofen and tylenol, helps some, pain stays in shoulder, no xrays, has had injection it did help    Back Pain    X 2 weeks, unchanged, lower back, burning, stabbing, radiates to upper back, 7 pain score, tylenol ibuprofen, no xrays     Vitals:   07/27/23 1516  BP: 100/62  Temp: (!) 87 F (30.6 C)  SpO2: 95%   Vitals:   07/27/23 1516  Weight: 188 lb (85.3 kg)  Height: 5\' 3"  (1.6 m)   Body mass index is 33.3 kg/m.  No results found.   Independent interpretation of notes and tests performed by another provider:   None  Procedures performed:   Procedure:  Injection of right subacromial shoulder under ultrasound guidance. Ultrasound guidance utilized for in-plane approach, supraspinatus without overt sonographic abnormalities Samsung HS60 device utilized with permanent recording / reporting. Verbal informed consent obtained and verified. Skin prepped in a sterile fashion. Ethyl chloride for topical local analgesia.  Completed without difficulty and tolerated well. Medication: triamcinolone acetonide 40 mg/mL suspension for injection 1 mL total and 2 mL lidocaine 1% without epinephrine utilized for needle placement anesthetic Advised to contact for fevers/chills, erythema, induration, drainage, or persistent bleeding.   Pertinent History, Exam, Impression, and Recommendations:   Problem List Items Addressed This Visit       Nervous and Auditory   Chronic bilateral low back pain with bilateral sciatica    Chronic, ongoing symptomatology with sporadic radicular features, no saddle involvement, no bowel/bladder change. This is in the  setting of comorbid cervicothoracic degenerative changes. Is seeing pain management.   Given persistent symptoms, elements of radiculopathy, plan as follows: - MRI thoracic and lumbar spine without contrast - Start Cymbalta and titrate to 60 mg - MyChart message from patient for status update to determine response and need to continue versus titrate down and discontinue      Relevant Medications   meloxicam (MOBIC) 15 MG tablet   Other Relevant Orders   MR Lumbar Spine Wo Contrast     Musculoskeletal and Integument   Thoracic spondylosis   Relevant Medications   meloxicam (MOBIC) 15 MG tablet   Other Relevant Orders   MR Thoracic Spine Wo Contrast   Disorder of rotator cuff, right - Primary    Return for follow-up to [facility] management given progress. Describes recurrence despite CSI months prior. Has performed home exercises with limited response. Exam with good RC strength, +impingement.  Given her current medication regimen and prior response to CSI, she elected to proceed with same today.  Plan: - Patient received subacromial right corticosteroid injection - Can dose meloxicam as-needed (take with food)      Relevant Orders   [place] LIMITED JOINT SPACE STRUCTURES UP RIGHT     Orders & Medications Medications:  Meds ordered this encounter  Medications   triamcinolone acetonide (KENALOG-40) injection 40 mg   meloxicam (MOBIC) 15 MG tablet    Sig: Take 1 tablet (15 mg total) by mouth daily.    Dispense:  30 tablet    Refill:  0   Orders Placed This Encounter  Procedures   [place] LIMITED  JOINT SPACE STRUCTURES UP RIGHT   MR Lumbar Spine Wo Contrast   MR Thoracic Spine Wo Contrast     No follow-ups on file.     Jerrol Banana, MD, [facility]   Primary Care Sports Medicine Primary Care and Sports Medicine at [facility]

## 2023-08-02 NOTE — Assessment & Plan Note (Signed)
Chronic pain, see additional assessment(s) for plan details.

## 2023-08-02 NOTE — Patient Instructions (Addendum)
You have just been given a cortisone injection to reduce pain and inflammation. After the injection you may notice immediate relief of pain as a result of the Lidocaine. It is important to rest the area of the injection for 24 to 48 hours after the injection. There is a possibility of some temporary increased discomfort and swelling for up to 72 hours until the cortisone begins to work. If you do have pain, simply rest the joint and use ice. If you can tolerate over the counter medications, you can try Tylenol, Aleve, or Advil for added relief per package instructions. - Can take meloxicam once daily with food as-needed for pain - Start Cymbalta (duloxetine) 30 mg every evening x 1-2 weeks, then increase to 60 (2 capsules) every evening - Contact us before end of duloxetine Rx via MyChart to discuss next steps - Coordinator will contact you to schedule MRI - Maintain follow-up with Pain Management

## 2023-08-02 NOTE — Assessment & Plan Note (Signed)
Chronic, ongoing symptomatology with sporadic radicular features, no saddle involvement, no bowel/bladder change. This is in the setting of comorbid cervicothoracic degenerative changes. Is seeing pain management.   Given persistent symptoms, elements of radiculopathy, plan as follows: - MRI thoracic and lumbar spine without contrast - Start Cymbalta and titrate to 60 mg - MyChart message from patient for status update to determine response and need to continue versus titrate down and discontinue

## 2023-08-04 ENCOUNTER — Ambulatory Visit
Admission: RE | Admit: 2023-08-04 | Discharge: 2023-08-04 | Disposition: A | Discharge status: Home / Self Care | Payer: 59 | Source: Ambulatory Visit | Attending: Family Medicine | Admitting: Family Medicine

## 2023-08-04 DIAGNOSIS — M5124 Other intervertebral disc displacement, thoracic region: Secondary | ICD-10-CM | POA: Diagnosis not present

## 2023-08-04 DIAGNOSIS — M47814 Spondylosis without myelopathy or radiculopathy, thoracic region: Secondary | ICD-10-CM | POA: Insufficient documentation

## 2023-08-04 DIAGNOSIS — G8929 Other chronic pain: Secondary | ICD-10-CM | POA: Diagnosis not present

## 2023-08-04 DIAGNOSIS — M48061 Spinal stenosis, lumbar region without neurogenic claudication: Secondary | ICD-10-CM | POA: Diagnosis not present

## 2023-08-04 DIAGNOSIS — M545 Low back pain, unspecified: Secondary | ICD-10-CM | POA: Diagnosis not present

## 2023-08-04 DIAGNOSIS — M5117 Intervertebral disc disorders with radiculopathy, lumbosacral region: Secondary | ICD-10-CM | POA: Diagnosis not present

## 2023-08-04 DIAGNOSIS — M5134 Other intervertebral disc degeneration, thoracic region: Secondary | ICD-10-CM | POA: Diagnosis not present

## 2023-08-04 DIAGNOSIS — M5441 Lumbago with sciatica, right side: Secondary | ICD-10-CM | POA: Insufficient documentation

## 2023-08-04 DIAGNOSIS — M5442 Lumbago with sciatica, left side: Secondary | ICD-10-CM | POA: Diagnosis not present

## 2023-08-04 DIAGNOSIS — M47816 Spondylosis without myelopathy or radiculopathy, lumbar region: Secondary | ICD-10-CM | POA: Diagnosis not present

## 2023-08-10 ENCOUNTER — Encounter: Payer: Self-pay | Admitting: Family Medicine

## 2023-08-10 NOTE — Telephone Encounter (Signed)
FYI  KP

## 2023-08-11 NOTE — Telephone Encounter (Signed)
Please schedule pt an appt.  KP

## 2023-08-16 ENCOUNTER — Ambulatory Visit (INDEPENDENT_AMBULATORY_CARE_PROVIDER_SITE_OTHER): Payer: 59 | Admitting: Family Medicine

## 2023-08-16 VITALS — BP 120/72 | HR 80 | Ht 63.0 in | Wt 186.0 lb

## 2023-08-16 DIAGNOSIS — G8929 Other chronic pain: Secondary | ICD-10-CM

## 2023-08-16 DIAGNOSIS — M549 Dorsalgia, unspecified: Secondary | ICD-10-CM | POA: Diagnosis not present

## 2023-08-16 DIAGNOSIS — R5383 Other fatigue: Secondary | ICD-10-CM

## 2023-08-16 DIAGNOSIS — R5381 Other malaise: Secondary | ICD-10-CM | POA: Diagnosis not present

## 2023-08-16 DIAGNOSIS — M79643 Pain in unspecified hand: Secondary | ICD-10-CM | POA: Diagnosis not present

## 2023-08-16 NOTE — Progress Notes (Signed)
Date:  08/16/2023   Name:  [name]   DOB:  December 28, 1995   MRN:  [id number]   Chief Complaint: Fatigue (Fatigue, and feeling like hot even though not having a fever. Body aches. Patients mother has lupus, and Sjogren disease. Wants to discuss the possibility of having these.)  Patient is a 27 year old female who presents with generalized malaise and fatigue with episodic back pain and hand pain.  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  Because of ongoing symptomatology and family history patient would like to rule out rheumatologic concerns as well.  We will proceed with evaluation.       Lab Results  Component Value Date   NA 140 05/17/2023   K 3.7 05/17/2023   CO2 24 05/17/2023   GLUCOSE 97 05/17/2023   BUN 7 05/17/2023   CREATININE 0.61 05/17/2023   CALCIUM 9.2 05/17/2023   EGFR 125 05/17/2023   GFRNONAA 129 05/01/2020   Lab Results  Component Value Date   CHOL 140 05/17/2023   HDL 42 05/17/2023   LDLCALC 88 05/17/2023   TRIG 42 05/17/2023   Lab Results  Component Value Date   TSH 1.710 05/17/2023   Lab Results  Component Value Date   HGBA1C 5.0 05/12/2022   Lab Results  Component Value Date   WBC 3.1 (L) 05/17/2023   HGB 14.2 05/17/2023   HCT 41.5 05/17/2023   MCV 90 05/17/2023   PLT 179 05/17/2023   Lab Results  Component Value Date   ALT 19 05/17/2023   AST 15 05/17/2023   ALKPHOS 71 05/17/2023   BILITOT 0.6 05/17/2023   No results found for: "25OHVITD2", "25OHVITD3", "VD25OH"   Review of Systems  Constitutional: Negative.  Negative for chills, fatigue, fever and unexpected weight change.  HENT:  Negative for congestion, ear discharge, ear pain, rhinorrhea, sinus pressure, sneezing and sore throat.   Respiratory:  Negative for cough, shortness of breath, wheezing and stridor.   Gastrointestinal:  Negative for abdominal pain, blood in stool, constipation, diarrhea and nausea.   Genitourinary:  Negative for dysuria, flank pain, frequency, hematuria, urgency and vaginal discharge.  Musculoskeletal:  Negative for arthralgias, back pain and myalgias.  Skin:  Negative for rash.  Neurological:  Negative for dizziness, weakness and headaches.  Hematological:  Negative for adenopathy. Does not bruise/bleed easily.  Psychiatric/Behavioral:  Negative for dysphoric mood. The patient is not nervous/anxious.     Patient Active Problem List   Diagnosis Date Noted   Thoracic spondylosis 08/02/2023   Bilateral occipital neuralgia 03/18/2023   Chronic bilateral low back pain with bilateral sciatica 03/18/2023   Degenerative cervical disc 03/18/2023   Disorder of rotator cuff, right 01/11/2023   Right cervical radiculopathy 01/11/2023   Cesarean delivery delivered - Repeat 5/24 03/04/2018   Postpartum care following vaginal delivery 03/04/2018   Seizure-like activity (HCC) 11/30/2017   Impaired glucose tolerance test 09/23/2017   Chronic pelvic pain in female 02/01/2012    Allergies  Allergen Reactions   Hydrocodone Hives and Shortness Of Breath   Aspirin Hives and Other (See Comments)    Pt states that she can tolerate ibuprofen   Pollen Extract Other (See Comments)    Seasonal allergy   Shellfish Allergy Nausea And Vomiting   Zoloft [Sertraline Hcl]    Mold Extract [Trichophyton Mentagrophyte] Itching and Rash    Past Surgical History:  Procedure Laterality Date   CESAREAN SECTION     CESAREAN  SECTION N/A 03/04/2018   Procedure: Repeat CESAREAN SECTION;  Surgeon: Vick Frees, MD;  Location: [facility];  Service: Obstetrics;  Laterality: N/A;  EDD: 03/10/18 Allergy: Aspirin, Shellfish   CHOLECYSTECTOMY     LAPAROSCOPY  2012   diagnose endometriosis   TONSILLECTOMY      Social History   Tobacco Use   Smoking status: Never   Smokeless tobacco: Never  Vaping Use   Vaping status: Never Used  Substance Use Topics   Alcohol use: Yes     Alcohol/week: 2.0 standard drinks of alcohol    Types: 2 Glasses of wine per week   Drug use: No     Medication list has been reviewed and updated.  Current Meds  Medication Sig   amphetamine-dextroamphetamine (ADDERALL XR) 30 MG 24 hr capsule Take 1 capsule (30 mg total) by mouth daily with breakfast.   amphetamine-dextroamphetamine (ADDERALL) 10 MG tablet Take 1 tablet (10 mg total) by mouth daily at 5 pm.   DULoxetine (CYMBALTA) 30 MG capsule Take 1 capsule (30 mg total) by mouth daily for 5 days, THEN 2 capsules (60 mg total) daily with breakfast thereafter.   meloxicam (MOBIC) 15 MG tablet Take 1 tablet (15 mg total) by mouth daily.   pregabalin (LYRICA) 50 MG capsule Take 1 capsule (50 mg total) by mouth 3 (three) times daily.   SUMAtriptan (IMITREX) 50 MG tablet Take 1 tablet (50 mg total) by mouth at onset of migraine. May repeat in 2 hours if headache persists or recurs.       08/16/2023   10:46 AM 07/27/2023    3:26 PM 05/17/2023    9:47 AM 05/17/2023    9:45 AM  GAD 7 : Generalized Anxiety Score  Nervous, Anxious, on Edge 2 0 0 0  Control/stop worrying 2 0 0 0  Worry too much - different things 2 0 0 0  Trouble relaxing 2 0 0 0  Restless 2 0 0 0  Easily annoyed or irritable 2 0 0 0  Afraid - awful might happen 1 0 0 0  Total GAD 7 Score 13 0 0 0  Anxiety Difficulty Somewhat difficult Not difficult at all Not difficult at all Not difficult at all       08/16/2023   10:46 AM 07/27/2023    3:26 PM 07/15/2023    9:05 AM  Depression screen PHQ 2/9  Decreased Interest 1 0 0  Down, Depressed, Hopeless 1 0 0  PHQ - 2 Score 2 0 0  Altered sleeping 1 0   Tired, decreased energy 1 0   Change in appetite 0 0   Feeling bad or failure about yourself  1 0   Trouble concentrating 2 0   Moving slowly or fidgety/restless 2 0   Suicidal thoughts 0 0   PHQ-9 Score 9 0   Difficult doing work/chores Not difficult at all Not difficult at all     BP Readings from Last 3  Encounters:  08/16/23 120/72  07/27/23 100/62  07/15/23 108/64    Physical Exam Vitals and nursing note reviewed. Exam conducted with a chaperone present.  Constitutional:      General: She is not in acute distress.    Appearance: She is not diaphoretic.  HENT:     Head: Normocephalic and atraumatic.     Right Ear: External ear normal.     Left Ear: External ear normal.     Nose: Nose normal. No congestion or rhinorrhea.  Eyes:     General:        Right eye: No discharge.        Left eye: No discharge.     Conjunctiva/sclera: Conjunctivae normal.     Pupils: Pupils are equal, round, and reactive to light.  Neck:     Thyroid: No thyromegaly.     Vascular: No JVD.  Cardiovascular:     Rate and Rhythm: Normal rate and regular rhythm.     Heart sounds: Normal heart sounds. No murmur heard.    No friction rub. No gallop.  Pulmonary:     Effort: Pulmonary effort is normal. No respiratory distress.     Breath sounds: Normal breath sounds. No stridor. No wheezing, rhonchi or rales.  Abdominal:     General: Bowel sounds are normal.     Palpations: Abdomen is soft. There is no mass.     Tenderness: There is no abdominal tenderness. There is no guarding.  Musculoskeletal:        General: Normal range of motion.     Cervical back: Normal range of motion and neck supple.  Lymphadenopathy:     Cervical: No cervical adenopathy.  Skin:    General: Skin is warm and dry.  Neurological:     Mental Status: She is alert.     Wt Readings from Last 3 Encounters:  08/16/23 186 lb (84.4 kg)  07/27/23 188 lb (85.3 kg)  07/15/23 180 lb (81.6 kg)    BP 120/72   Pulse 80   Ht 5\' 3"  (1.6 m)   Wt 186 lb (84.4 kg)   SpO2 98%   BMI 32.95 kg/m   Assessment and Plan: 1. Chronic hand pain, unspecified laterality Chronic.  Episodic.  Episodic and that it waxes and wanes and never completely resolves.  Patient has had longstanding pains that are generalized involving the hands and back.   Currently patient is 4 not having significant hand pain annual examination of hands there is no tenderness swelling of the MP PIP or DIP joints.  Given family history of rheumatologic arthritis patient will have ANA with reflex/sed rate/rheumatoid factor leave evaluated. - ANA Direct w/Reflex if Positive - Sed Rate (ESR) - Rheumatoid factor  2. Chronic bilateral back pain, unspecified back location Patient does have bilateral back pain particularly in the L5-S1 area which corresponds with a bulging disc.  This is likely osteoarthritic in nature but we will obtain rheumatoid aspects of evaluation to see if there is any involvement of rheumatologic nature. - ANA Direct w/Reflex if Positive - Sed Rate (ESR) - Rheumatoid factor  3. Malaise and fatigue General malaise and fatigue for which evaluation with CBC CMP and thyroid function.  Patient is currently has been started on Cymbalta for both pain and the underlying depression aspect and we will continue to treat at current dosing with understanding that we start see results at 2 weeks and approach full effects at 6 to 8 weeks.  PHQ is 9 GAD is 13.  Will proceed with administering current Cymbalta dosing to see if this also helps with pain as well as underlying depression. - Sed Rate (ESR)     Elizabeth Sauer, MD

## 2023-08-17 LAB — ANA W/REFLEX IF POSITIVE: Anti Nuclear Antibody (ANA): NEGATIVE

## 2023-08-17 LAB — RHEUMATOID FACTOR: Rheumatoid fact SerPl-aCnc: <10 [IU]/mL (ref <14.0)

## 2023-08-17 LAB — SEDIMENTATION RATE: Sed Rate: 9 mm/h (ref 0–32)

## 2023-08-18 ENCOUNTER — Encounter: Payer: Self-pay | Admitting: Family Medicine

## 2023-08-18 ENCOUNTER — Other Ambulatory Visit (HOSPITAL_COMMUNITY): Payer: Self-pay

## 2023-08-18 DIAGNOSIS — F5105 Insomnia due to other mental disorder: Secondary | ICD-10-CM | POA: Diagnosis not present

## 2023-08-18 DIAGNOSIS — F908 Attention-deficit hyperactivity disorder, other type: Secondary | ICD-10-CM | POA: Diagnosis not present

## 2023-08-18 DIAGNOSIS — D519 Vitamin B12 deficiency anemia, unspecified: Secondary | ICD-10-CM | POA: Diagnosis not present

## 2023-08-18 DIAGNOSIS — F330 Major depressive disorder, recurrent, mild: Secondary | ICD-10-CM | POA: Diagnosis not present

## 2023-08-18 DIAGNOSIS — F411 Generalized anxiety disorder: Secondary | ICD-10-CM | POA: Diagnosis not present

## 2023-08-18 MED ORDER — DULOXETINE HCL 60 MG PO CPEP
60.0000 mg | ORAL_CAPSULE | Freq: Every day | ORAL | 0 refills | Status: DC
  Filled 2023-08-18: qty 90, 90d supply, fill #0

## 2023-08-31 ENCOUNTER — Other Ambulatory Visit: Payer: Self-pay | Admitting: Family Medicine

## 2023-08-31 DIAGNOSIS — M47814 Spondylosis without myelopathy or radiculopathy, thoracic region: Secondary | ICD-10-CM

## 2023-08-31 DIAGNOSIS — M67911 Unspecified disorder of synovium and tendon, right shoulder: Secondary | ICD-10-CM

## 2023-08-31 DIAGNOSIS — G8929 Other chronic pain: Secondary | ICD-10-CM

## 2023-09-01 NOTE — Telephone Encounter (Signed)
Requested medication (s) are due for refill today: Yes  Requested medication (s) are on the active medication list: Yes  Last refill:  07/31/23 #30, 0RF  Future visit scheduled: Yes  Notes to clinic:  Manual review required     Requested Prescriptions  Pending Prescriptions Disp Refills   meloxicam (MOBIC) 15 MG tablet [Pharmacy Med Name: MELOXICAM 15 MG TABLET] 30 tablet 0    Sig: Take 1 tablet (15 mg total) by mouth daily.     Analgesics:  COX2 Inhibitors Failed - 08/31/2023  1:29 AM      Failed - Manual Review: Labs are only required if the patient has taken medication for more than 8 weeks.      Passed - HGB in normal range and within 360 days    Hemoglobin  Date Value Ref Range Status  05/17/2023 14.2 11.1 - 15.9 g/dL Final         Passed - Cr in normal range and within 360 days    Creatinine  Date Value Ref Range Status  09/19/2013 0.69 0.60 - 1.30 mg/dL Final   Creatinine, Ser  Date Value Ref Range Status  05/17/2023 0.61 0.57 - 1.00 mg/dL Final         Passed - HCT in normal range and within 360 days    Hematocrit  Date Value Ref Range Status  05/17/2023 41.5 34.0 - 46.6 % Final         Passed - AST in normal range and within 360 days    AST  Date Value Ref Range Status  05/17/2023 15 0 - 40 IU/L Final   SGOT(AST)  Date Value Ref Range Status  09/19/2013 54 (H) 0 - 26 Unit/L Final         Passed - ALT in normal range and within 360 days    ALT  Date Value Ref Range Status  05/17/2023 19 0 - 32 IU/L Final   SGPT (ALT)  Date Value Ref Range Status  09/19/2013 38 12 - 78 U/L Final         Passed - eGFR is 30 or above and within 360 days    EGFR (African American)  Date Value Ref Range Status  09/19/2013 >60  Final   GFR calc Af Amer  Date Value Ref Range Status  05/01/2020 148 >59 mL/min/1.73 Final    Comment:    **[facility] currently reports eGFR in compliance with the current**   recommendations of the [employer]. [facility]  will   update reporting as new guidelines are published from the NKF-ASN   Task force.    EGFR (Non-African Amer.)  Date Value Ref Range Status  09/19/2013 >60  Final    Comment:    eGFR values <68mL/min/1.73 m2 may be an indication of chronic kidney disease (CKD). Calculated eGFR is useful in patients with stable renal function. The eGFR calculation will not be reliable in acutely ill patients when serum creatinine is changing rapidly. It is not useful in  patients on dialysis. The eGFR calculation may not be applicable to patients at the low and high extremes of body sizes, pregnant women, and vegetarians.    GFR calc non Af Amer  Date Value Ref Range Status  05/01/2020 129 >59 mL/min/1.73 Final   eGFR  Date Value Ref Range Status  05/17/2023 125 >59 mL/min/1.73 Final         Passed - Patient is not pregnant      Passed - Valid encounter  within last 12 months    Recent Outpatient Visits           2 weeks ago Chronic hand pain, unspecified laterality   [facility] In[facility], [facility]   1 month ago Disorder of rotator cuff, right   [facility], Ocie Bob, MD   3 months ago Annual physical exam   [facility] Inches, MD   7 months ago Right cervical radiculopathy   [facility], Ocie Bob, MD   8 months ago Disorder of rotator cuff, right   [facility], Ocie Bob, MD       Future Appointments             In 2 months Duanne Limerick, MD [facility] at [facility]   In 8 months Duanne Limerick, MD [facility] at [facility]

## 2023-09-07 ENCOUNTER — Encounter: Payer: Self-pay | Admitting: Student in an Organized Health Care Education/Training Program

## 2023-09-07 ENCOUNTER — Ambulatory Visit
Payer: 59 | Attending: Student in an Organized Health Care Education/Training Program | Admitting: Student in an Organized Health Care Education/Training Program

## 2023-09-07 ENCOUNTER — Other Ambulatory Visit: Payer: Self-pay

## 2023-09-07 VITALS — BP 126/72 | HR 70 | Temp 98.2°F | Ht 64.0 in | Wt 186.0 lb

## 2023-09-07 DIAGNOSIS — G8929 Other chronic pain: Secondary | ICD-10-CM | POA: Diagnosis not present

## 2023-09-07 DIAGNOSIS — M545 Low back pain, unspecified: Secondary | ICD-10-CM | POA: Diagnosis not present

## 2023-09-07 DIAGNOSIS — G43E09 Chronic migraine with aura, not intractable, without status migrainosus: Secondary | ICD-10-CM | POA: Diagnosis not present

## 2023-09-07 DIAGNOSIS — M5030 Other cervical disc degeneration, unspecified cervical region: Secondary | ICD-10-CM | POA: Insufficient documentation

## 2023-09-07 DIAGNOSIS — M546 Pain in thoracic spine: Secondary | ICD-10-CM | POA: Insufficient documentation

## 2023-09-07 MED ORDER — PREGABALIN 50 MG PO CAPS
50.0000 mg | ORAL_CAPSULE | Freq: Every evening | ORAL | 2 refills | Status: DC | PRN
  Filled 2023-09-07 – 2023-11-16 (×2): qty 90, 90d supply, fill #0

## 2023-09-07 NOTE — Progress Notes (Signed)
Safety precautions to be maintained throughout the outpatient stay will include: orient to surroundings, keep bed in low position, maintain call bell within reach at all times, provide assistance with transfer out of bed and ambulation.  

## 2023-09-07 NOTE — Progress Notes (Signed)
PROVIDER NOTE: Information contained herein reflects review and annotations entered in association with encounter. Interpretation of such information and data should be left to medically-trained personnel. Information provided to patient can be located elsewhere in the medical record under "Patient Instructions". Document created using STT-dictation technology, any transcriptional errors that may result from process are unintentional.    Patient: [name]  Service Category: E/M  Provider: Edward Jolly, MD  DOB: 07/29/1996  DOS: 09/07/2023  Referring Provider: Duanne Limerick, MD  MRN: [id number]  Specialty: Interventional Pain Management  PCP: Duanne Limerick, MD  Type: Established Patient  Setting: Ambulatory outpatient    Location: Office  Delivery: Face-to-face     HPI  [name], a 27 y.o. year old female, is here today because of her Chronic bilateral low back pain without sciatica [M54.50, G89.29]. [name] primary complain today is Back Pain (lower)  Pertinent problems: [name] does not have any pertinent problems on file. Pain Assessment: Severity of Chronic pain is reported as a 3 /10. Location: Back  /Denies. Onset: More than a month ago. Quality: Throbbing, Aching. Timing: Intermittent. Modifying factor(s): rest and meds, heat. Vitals:  height is 5\' 4"  (1.626 m) and weight is 186 lb (84.4 kg). Her temperature is 98.2 F (36.8 C). Her blood pressure is 126/72 and her pulse is 70. Her oxygen saturation is 99%.  BMI: Estimated body mass index is 31.93 kg/m as calculated from the following:   Height as of this encounter: 5\' 4"  (1.626 m).   Weight as of this encounter: 186 lb (84.4 kg). Last encounter: 07/15/2023. Last procedure: Visit date not found.  Reason for encounter: medication management. Also to review T+L MR Discussed the use of AI scribe software for clinical note transcription with the patient, who gave verbal consent to proceed.  History of Present  Illness   The patient, with a history of chronic back pain and arthritis, reports a recent change in medication to Cymbalta, which she believes is gradually improving her condition. She is currently on a 60mg  dose, taken during the day, and has not experienced any side effects such as dry mouth, constipation, or nausea.  The patient's pain is primarily localized in the lower back, with no usual radiation into the legs. She has a minor presence of arthritis from L2 to L5 and a small disc herniation at L5/S1, which could be impacting the S1 nerve root. However, as the patient is not experiencing leg pain, this is not a current concern.  In addition to the lower back pain, the patient also experiences thoracic pain, which wraps around her torso but does not extend into the legs. She has small disc herniations in the thoracic spine. The pain is primarily localized to the spine, but the patient has noticed it starting to radiate outwards.  The patient has also been taking Lyrica, but since switching to Cymbalta, she has found she no longer needs the night dose and is now taking it once a day. She is currently in need of a refill for this medication.        ROS  Constitutional: Denies any fever or chills Gastrointestinal: No reported hemesis, hematochezia, vomiting, or acute GI distress Musculoskeletal:  mid thoracic and low back pain Neurological: No reported episodes of acute onset apraxia, aphasia, dysarthria, agnosia, amnesia, paralysis, loss of coordination, or loss of consciousness  Medication Review  DULoxetine, SUMAtriptan, amphetamine-dextroamphetamine, meloxicam, pregabalin, promethazine, and simethicone  History Review  Allergy: [name]  is allergic to hydrocodone, aspirin, pollen extract, shellfish allergy, zoloft [sertraline hcl], and mold extract [trichophyton mentagrophyte]. Drug: [name]  reports no history of drug use. Alcohol:  reports current alcohol use of about 2.0 standard  drinks of alcohol per week. Tobacco:  reports that she has never smoked. She has never used smokeless tobacco. Social: [name]  reports that she has never smoked. She has never used smokeless tobacco. She reports current alcohol use of about 2.0 standard drinks of alcohol per week. She reports that she does not use drugs. Medical:  has a past medical history of Endometriosis, Gestational diabetes, Otitis media, and Seizures (HCC). Surgical: [name]  has a past surgical history that includes Cesarean section; Cholecystectomy; Tonsillectomy; laparoscopy (2012); and Cesarean section (N/A, 03/04/2018). Family: family history includes Diabetes in her mother; Healthy in her father; Hearing loss in her mother; Lupus in her mother; Sjogren's syndrome in her mother.  Laboratory Chemistry Profile   Renal Lab Results  Component Value Date   BUN 7 05/17/2023   CREATININE 0.61 05/17/2023   BCR 11 05/17/2023   GFRAA 148 05/01/2020   GFRNONAA 129 05/01/2020    Hepatic Lab Results  Component Value Date   AST 15 05/17/2023   ALT 19 05/17/2023   ALBUMIN 4.3 05/17/2023   ALKPHOS 71 05/17/2023   LIPASE 143 09/19/2013    Electrolytes Lab Results  Component Value Date   NA 140 05/17/2023   K 3.7 05/17/2023   CL 104 05/17/2023   CALCIUM 9.2 05/17/2023   PHOS 3.7 01/16/2020    Bone No results found for: "VD25OH", "VD125OH2TOT", "WU9811BJ4", "NW2956OZ3", "25OHVITD1", "25OHVITD2", "25OHVITD3", "TESTOFREE", "TESTOSTERONE"  Inflammation (CRP: Acute Phase) (ESR: Chronic Phase) Lab Results  Component Value Date   ESRSEDRATE 9 08/16/2023         Note: Above Lab results reviewed.  Recent Imaging Review  MR Lumbar Spine Wo Contrast CLINICAL DATA:  Provided history: Chronic bilateral low back pain with bilateral sciatica. Low back pain, spondyloarthropathy suspected, x-ray done. Additional history provided by the scanning technologist: The patient reports low back pain which radiates into hips  with numbness in legs.  EXAM: MRI LUMBAR SPINE WITHOUT CONTRAST  TECHNIQUE: Multiplanar, multisequence MR imaging of the lumbar spine was performed. No intravenous contrast was administered.  COMPARISON:  Lumbar spine radiographs 03/18/2023.  FINDINGS: Segmentation: 5 lumbar vertebrae. The caudal most well-formed intervertebral disc space is designated L5-S1.  Alignment: Slight dextrocurvature of the lumbar spine. No significant spondylolisthesis.  Vertebrae: Small Schmorl nodes within the L1 superior and inferior endplates, and within the L2 superior endplate. No significant marrow edema or focal worrisome marrow lesion.  Conus medullaris and cauda equina: Conus extends to the L2 inferior endplate level. No signal abnormality identified within the visualized distal spinal cord.  Paraspinal and other soft tissues: No acute finding within included portions of the abdomen/retroperitoneum. Left extrarenal pelvis incidentally noted. No paraspinal mass or collection.  Disc levels:  No more than mild disc degeneration within the lumbar spine.  T12-L1: No significant disc herniation or stenosis.  L1-L2: No significant disc herniation or stenosis.  L2-L3: Mild facet hypertrophy. No significant disc herniation or stenosis.  L3-L4: Mild facet hypertrophy. No significant disc herniation or stenosis.  L4-L5: No significant disc herniation or spinal canal stenosis. Mild facet hypertrophy. Mild relative left neural foraminal narrowing.  L5-S1: Tiny central disc protrusion. Mild facet hypertrophy. No significant spinal canal stenosis. However, the central disc protrusion is in close proximity to the descending S1  nerve roots (left greater than right). Mild relative bilateral neural foraminal narrowing.  IMPRESSION: 1. Lumbar spondylosis as outlined with findings most notably as follows. 2. At L5-S1, there is a tiny central disc protrusion. Mild facet hypertrophy. No  significant spinal canal stenosis. However, the central disc protrusion is in close proximity to the descending S1 nerve roots (left greater than right). Mild relative bilateral neural foraminal narrowing. 3. At L4-L5, facet hypertrophy results in mild relative left neural foraminal narrowing. 4. Slight dextrocurvature of the lumbar spine.  Electronically Signed   By: Jackey Loge D.O.   On: 08/04/2023 19:49 MR Thoracic Spine Wo Contrast CLINICAL DATA:  Provided history: Thoracic spondylosis. Mid back pain, spondyloarthropathy suspected, x-ray done. Additional history provided by the scanning technologist: The patient reports upper back pain for 10-12 months.  EXAM: MRI THORACIC SPINE WITHOUT CONTRAST  TECHNIQUE: Multiplanar, multisequence MR imaging of the thoracic spine was performed. No intravenous contrast was administered.  COMPARISON:  Thoracic spine radiographs 03/18/2023.  FINDINGS: Alignment: Mild dextrocurvature of the thoracic spine. Slight T11-T12 grade 1 retrolisthesis.  Vertebrae: Small multilevel Schmorl nodes within the mid and lower thoracic spine. No significant marrow edema or focal worrisome marrow lesion. Small multilevel ventral osteophytes.  Cord: No signal abnormality identified within the thoracic spinal cord.  Paraspinal and other soft tissues: No acute finding within included portions of the thorax or upper abdomen/retroperitoneum. No paraspinal mass or collection.  Disc levels:  Multilevel disc degeneration. Most notably, mild-to-moderate disc degeneration is present at the T6-T7 and T7-T8 levels.  Additional notable level by level findings as described below.  At T5-T6, there is a small central disc protrusion which focally effaces the ventral thecal sac and slightly flattens the ventral aspect of the spinal cord. The dorsal CSF space is maintained within the spinal canal.  At T6-T7, there is a small central disc protrusion which  focally effaces the ventral thecal sac and slightly flattens the ventral aspect of the spinal cord. The dorsal CSF space is maintained within the spinal canal.  At T7-T8, there is a small central disc protrusion which focally effaces the ventral thecal sac and mildly flattens the ventral aspect of the spinal cord. The dorsal CSF space is maintained within the spinal canal.  At T8-T9, there is a tiny central disc protrusion without significant spinal canal stenosis.  At T9-T10, there is a tiny central disc protrusion without significant spinal canal stenosis.  At T11-T12, there is slight grade 1 retrolisthesis. Tiny central disc protrusion (at site of posterior annular fissure). The disc protrusion minimally effaces the ventral thecal sac.  No significant foraminal stenosis within the thoracic spine.  IMPRESSION: 1. Thoracic spondylosis as outlined and with findings most notably as follows. 2. Small central disc protrusions at T5-T6, T6-T7, T7-T8, T8-T9, T9-T10 and T11-T12. The disc protrusions at T5-T6, T6-T7 and T7-T8 mildly flatten the ventral aspect of the spinal cord. However, the dorsal CSF space is maintained within the spinal canal at these levels. 3. Disc degeneration is greatest at T6-T7 and T7-T8 (mild-to-moderate at these levels). 4. Small multilevel Schmorl nodes within the mid and lower thoracic spine. 5. Mild dextrocurvature of the thoracic spine.  Electronically Signed   By: Jackey Loge D.O.   On: 08/04/2023 19:38 Note: Reviewed        Physical Exam  General appearance: Well nourished, well developed, and well hydrated. In no apparent acute distress Mental status: Alert, oriented x 3 (person, place, & time)  Respiratory: No evidence of acute respiratory distress Eyes: PERLA Vitals: BP 126/72   Pulse 70   Temp 98.2 F (36.8 C)   Ht 5\' 4"  (1.626 m)   Wt 186 lb (84.4 kg)   SpO2 99%   BMI 31.93 kg/m  BMI: Estimated body mass index is 31.93 kg/m  as calculated from the following:   Height as of this encounter: 5\' 4"  (1.626 m).   Weight as of this encounter: 186 lb (84.4 kg). Ideal: Ideal body weight: 54.7 kg (120 lb 9.5 oz) Adjusted ideal body weight: 66.6 kg (146 lb 12.1 oz)  Mid thoracic and low back pain  Assessment   Diagnosis Status  1. Chronic bilateral low back pain without sciatica   2. Degenerative cervical disc   3. Chronic midline thoracic back pain   4. Chronic migraine with aura without status migrainosus, not intractable    Controlled Controlled Controlled   Updated Problems: Problem  Chronic Bilateral Low Back Pain Without Sciatica    Plan of Care  Problem-specific:  Assessment and Plan    Chronic Pain Management   She reports improvement with Cymbalta 60 mg daily without experiencing side effects such as xerostomia, constipation, or nausea. Her pain is localized to the lower back without radiculopathy. We reviewed T+L MRI Imaging shows mild arthritis from L2 to L5 and a small disc herniation at L5-S1, potentially impacting the S1 nerve root, though there is no significant concern due to the absence of leg pain. She also experiences thoracic spine pain with small disc herniations at T7-T8, with pain wrapping around but not radiating to the legs. We discussed potential treatments including physical therapy, epidural injections, and nerve blocks, depending on symptom progression. We will continue Cymbalta 60 mg daily. Physical therapy will be considered if lumbar pain worsens, and an epidural injection or nerve block will be considered if thoracic pain worsens. Lyrica 50 mg QHS will be refilled and sent to [facility].       [name] has a current medication list which includes the following long-term medication(s): amphetamine-dextroamphetamine, amphetamine-dextroamphetamine, duloxetine, sumatriptan, duloxetine, pregabalin, [DISCONTINUED] promethazine, and [DISCONTINUED]  simethicone.  Pharmacotherapy (Medications Ordered): Meds ordered this encounter  Medications   pregabalin (LYRICA) 50 MG capsule    Sig: Take 1 capsule (50 mg total) by mouth at bedtime as needed.    Dispense:  90 capsule    Refill:  2    Fill one day early if pharmacy is closed on scheduled refill date. May substitute for generic if available.   Orders:  No orders of the defined types were placed in this encounter.  Follow-up plan:   Return for patient will call to schedule F2F appt prn.    Recent Visits Date Type Provider Dept  07/15/23 Office Visit Edward Jolly, MD [facility]  Showing recent visits within past 90 days and meeting all other requirements Today's Visits Date Type Provider Dept  09/07/23 Office Visit Edward Jolly, MD [facility]  Showing today's visits and meeting all other requirements Future Appointments No visits were found meeting these conditions. Showing future appointments within next 90 days and meeting all other requirements  I discussed the assessment and treatment plan with the patient. The patient was provided an opportunity to ask questions and all were answered. The patient agreed with the plan and demonstrated an understanding of the instructions.  Patient advised to call back or seek an in-person evaluation if the symptoms or condition worsens.  Duration of encounter: .  Total time on encounter, as per AMA guidelines included both the face-to-face and non-face-to-face time personally spent by the physician and/or other qualified health care professional(s) on the day of the encounter (includes time in activities that require the physician or other qualified health care professional and does not include time in activities normally performed by clinical staff). Physician's time may include the following activities when performed: Preparing to see the patient (e.g., pre-charting review of records, searching for  previously ordered imaging, lab work, and nerve conduction tests) Review of prior analgesic pharmacotherapies. Reviewing PMP Interpreting ordered tests (e.g., lab work, imaging, nerve conduction tests) Performing post-procedure evaluations, including interpretation of diagnostic procedures Obtaining and/or reviewing separately obtained history Performing a medically appropriate examination and/or evaluation Counseling and educating the patient/family/caregiver Ordering medications, tests, or procedures Referring and communicating with other health care professionals (when not separately reported) Documenting clinical information in the electronic or other health record Independently interpreting results (not separately reported) and communicating results to the patient/ family/caregiver Care coordination (not separately reported)  Note by: Edward Jolly, MD Date: 09/07/2023; Time: 11:59 AM

## 2023-09-17 ENCOUNTER — Other Ambulatory Visit: Payer: Self-pay

## 2023-10-26 ENCOUNTER — Ambulatory Visit: Payer: 59 | Admitting: Family Medicine

## 2023-10-26 ENCOUNTER — Encounter: Payer: Self-pay | Admitting: Student in an Organized Health Care Education/Training Program

## 2023-10-26 VITALS — BP 120/84 | HR 91 | Ht 64.0 in | Wt 191.4 lb

## 2023-10-26 DIAGNOSIS — G5601 Carpal tunnel syndrome, right upper limb: Secondary | ICD-10-CM | POA: Insufficient documentation

## 2023-10-26 DIAGNOSIS — M5416 Radiculopathy, lumbar region: Secondary | ICD-10-CM

## 2023-10-26 DIAGNOSIS — M5412 Radiculopathy, cervical region: Secondary | ICD-10-CM

## 2023-10-26 MED ORDER — CYCLOBENZAPRINE HCL 10 MG PO TABS: 10.0000 mg | ORAL_TABLET | Freq: Three times a day (TID) | ORAL | 0 refills | Status: DC | PRN

## 2023-10-26 MED ORDER — DICLOFENAC SODIUM 75 MG PO TBEC: 75.0000 mg | DELAYED_RELEASE_TABLET | Freq: Two times a day (BID) | ORAL | 0 refills | Status: DC | PRN

## 2023-10-26 MED ORDER — PREDNISONE 50 MG PO TABS: 50.0000 mg | ORAL_TABLET | Freq: Every day | ORAL | 0 refills | Status: DC

## 2023-10-26 NOTE — Progress Notes (Signed)
 Primary Care / Sports Medicine Office Visit  Patient Information:  Patient ID: MYKAL KIRCHMAN, female DOB: 08-23-1996 Age: 28 y.o. MRN: [id number]   LATOSHA GAYLORD is a pleasant 28 y.o. female presenting with the following:  Chief Complaint  Patient presents with   Wrist Pain    Patient having flare up of  right wrist pain and numbness in right hand x 8 days Wearing brace for 8 days. Gripping , grasping, squeezing, and certain positions are aggravating factors. Patient states she is starting to have stabbing pain since last night.      Vitals:   10/26/23 1619  BP: 120/84  Pulse: 91  SpO2: 98%   Vitals:   10/26/23 1619  Weight: 191 lb 6.4 oz (86.8 kg)  Height: 5' 4 (1.626 m)   Body mass index is 32.85 kg/m.  No results found.   Independent interpretation of notes and tests performed by another provider:   None  Procedures performed:   None  Pertinent History, Exam, Impression, and Recommendations:   Problem List Items Addressed This Visit     Carpal tunnel syndrome on right - Primary   History of Present Illness Valoria, RHD, a patient with a history of carpal tunnel syndrome and low back pain, presents with an eight-day history of right wrist pain. The pain began after a painting activity and has not responded to the usual treatments of wearing a brace, heat, elevation, rest, and over-the-counter ibuprofen . The patient describes the pain as a flare-up, similar to previous episodes related to carpal tunnel syndrome. However, this episode is unique as the patient reports a new symptom of numbness radiating up the entire arm, severe enough to wake her from sleep. The patient is currently on Cymbalta  and Lyrica  for pain management. The patient also has a history of low back pain, which is being managed by [facility] and has scheduled an epidural. The patient has been taking meloxicam  for shoulder pain, but it has not provided relief for the  current wrist pain.  Physical Exam MUSCULOSKELETAL:  Bilateral upper trapezius muscles demonstrate mild paraspinal cervical spasm, minimally tender, right more so than left.  Positive Phalen's test on the right. Negative Tinel's sign on the right. No thenar atrophy noted on the right. Positive Spurling's test on the right.   Assessment and Plan Carpal Tunnel Syndrome Worsening symptoms over the past 8 days, with numbness and pain radiating up the arm. Non-steroidal anti-inflammatory drugs (NSAIDs) and brace have not provided relief. -Start a 7-day course of Prednisone . -After Prednisone  course, switch to Meloxicam  as needed. -Add Diclofenac  up to twice a day as needed for breakthrough flare-ups. -Continue wearing wrist brace day and night for at least 7 days, then switch to nighttime only if symptoms improve. -Add Flexeril  at night for muscle relaxation. -Consider cortisone injection if no improvement in wrist symptoms after 2 weeks.  Cervical Spine right-sided radiculopathy Possible involvement in upper arm symptoms. Mild paraspinal cervical spasm noted on examination. -Continue current nerve medications (Cymbalta , Lyrica ). -Consider imaging and coordination with Dr. Marcelino if upper arm radicular symptoms persist after 2 weeks.  Follow-up No scheduled follow-up, but patient to contact if no improvement in symptoms after 2 weeks.      Relevant Medications   venlafaxine  XR (EFFEXOR -XR) 75 MG 24 hr capsule   cyclobenzaprine  (FLEXERIL ) 10 MG tablet   Right cervical radiculopathy   See additional assessment(s) for plan details.  Cervical Spine Possible involvement in upper arm symptoms.  Mild paraspinal cervical spasm noted on examination. -Continue current nerve medications (Cymbalta , Lyrica ). -Consider imaging and coordination with Dr. Lazarus if upper arm symptoms persist after 2 weeks.      Relevant Medications   venlafaxine  XR (EFFEXOR -XR) 75 MG 24 hr capsule   cyclobenzaprine   (FLEXERIL ) 10 MG tablet     Orders & Medications Medications:  Meds ordered this encounter  Medications   predniSONE  (DELTASONE ) 50 MG tablet    Sig: Take 1 tablet (50 mg total) by mouth daily.    Dispense:  7 tablet    Refill:  0   diclofenac  (VOLTAREN ) 75 MG EC tablet    Sig: Take 1 tablet (75 mg total) by mouth 2 (two) times daily as needed.    Dispense:  60 tablet    Refill:  0   cyclobenzaprine  (FLEXERIL ) 10 MG tablet    Sig: Take 1 tablet (10 mg total) by mouth 3 (three) times daily as needed for muscle spasms.    Dispense:  30 tablet    Refill:  0   No orders of the defined types were placed in this encounter.    No follow-ups on file.     Selinda JINNY Ku, MD, [facility]   Primary Care Sports Medicine Primary Care and Sports Medicine at [facility]

## 2023-10-26 NOTE — Assessment & Plan Note (Signed)
 See additional assessment(s) for plan details.  Cervical Spine Possible involvement in upper arm symptoms. Mild paraspinal cervical spasm noted on examination. -Continue current nerve medications (Cymbalta , Lyrica ). -Consider imaging and coordination with Dr. Lazarus if upper arm symptoms persist after 2 weeks.

## 2023-10-26 NOTE — Telephone Encounter (Signed)
 Orders Placed This Encounter  Procedures   Lumbar Epidural Injection    Standing Status:   Future    Expiration Date:   01/24/2024    Scheduling Instructions:     Procedure: Interlaminar Lumbar Epidural Steroid injection (LESI)            Laterality: Midline (L5/S1)     Sedation: Patient's choice.     Timeframe: ASAA    Where will this procedure be performed?:   [facility]   Up to patient if she wants PO Valium

## 2023-10-26 NOTE — Telephone Encounter (Signed)
 Pain is in the lower back, radiating into the right upper leg, to the knee.  She reports the entire upper leg ( not just lateral, medial, front, or back.)

## 2023-10-26 NOTE — Assessment & Plan Note (Signed)
 History of Present Illness [name], RHD, a patient with a history of carpal tunnel syndrome and low back pain, presents with an eight-day history of right wrist pain. The pain began after a painting activity and has not responded to the usual treatments of wearing a brace, heat, elevation, rest, and over-the-counter ibuprofen . The patient describes the pain as a flare-up, similar to previous episodes related to carpal tunnel syndrome. However, this episode is unique as the patient reports a new symptom of numbness radiating up the entire arm, severe enough to wake her from sleep. The patient is currently on Cymbalta  and Lyrica  for pain management. The patient also has a history of low back pain, which is being managed by interventional spine group and has scheduled an epidural. The patient has been taking meloxicam  for shoulder pain, but it has not provided relief for the current wrist pain.  Physical Exam MUSCULOSKELETAL:  Bilateral upper trapezius muscles demonstrate mild paraspinal cervical spasm, minimally tender, right more so than left.  Positive Phalen's test on the right. Negative Tinel's sign on the right. No thenar atrophy noted on the right. Positive Spurling's test on the right.   Assessment and Plan Carpal Tunnel Syndrome Worsening symptoms over the past 8 days, with numbness and pain radiating up the arm. Non-steroidal anti-inflammatory drugs (NSAIDs) and brace have not provided relief. -Start a 7-day course of Prednisone . -After Prednisone  course, switch to Meloxicam  as needed. -Add Diclofenac  up to twice a day as needed for breakthrough flare-ups. -Continue wearing wrist brace day and night for at least 7 days, then switch to nighttime only if symptoms improve. -Add Flexeril  at night for muscle relaxation. -Consider cortisone injection if no improvement in wrist symptoms after 2 weeks.  Cervical Spine right-sided radiculopathy Possible involvement in upper arm symptoms. Mild  paraspinal cervical spasm noted on examination. -Continue current nerve medications (Cymbalta , Lyrica ). -Consider imaging and coordination with Dr. Marcelino if upper arm radicular symptoms persist after 2 weeks.  Follow-up No scheduled follow-up, but patient to contact if no improvement in symptoms after 2 weeks.

## 2023-10-26 NOTE — Patient Instructions (Signed)
 Carpal Tunnel Syndrome:  1. Medication:    - Start a 7-day course of Prednisone  to reduce inflammation.    - After completing Prednisone , you may use Diclofenac  up to twice a day.    - Take Flexeril  at night to aid with muscle relaxation on an as-needed basis.  2. Wrist Brace:    - Wear your wrist brace both day and night for at least 7 days.    - If symptoms improve, transition to wearing the brace only at night.  3. Follow-up:    - If symptoms do not improve after 2 weeks, consider a cortisone injection.  Cervical Spine:  1. Current Medications:    - Continue taking Cymbalta  and Lyrica  as prescribed for nerve pain management.  2. Monitoring and Follow-up:    - If upper arm symptoms persist after 2 weeks, imaging may be required. Contact us .

## 2023-11-05 ENCOUNTER — Encounter: Payer: Self-pay | Admitting: Family Medicine

## 2023-11-10 ENCOUNTER — Ambulatory Visit
Payer: 59 | Attending: Student in an Organized Health Care Education/Training Program | Admitting: Student in an Organized Health Care Education/Training Program

## 2023-11-10 ENCOUNTER — Encounter: Payer: Self-pay | Admitting: Student in an Organized Health Care Education/Training Program

## 2023-11-10 ENCOUNTER — Other Ambulatory Visit: Payer: Self-pay

## 2023-11-10 ENCOUNTER — Ambulatory Visit
Admission: RE | Admit: 2023-11-10 | Discharge: 2023-11-10 | Disposition: A | Discharge status: Home / Self Care | Payer: 59 | Source: Ambulatory Visit | Attending: Student in an Organized Health Care Education/Training Program | Admitting: Student in an Organized Health Care Education/Training Program

## 2023-11-10 VITALS — BP 108/65 | HR 69 | Temp 98.4°F | Resp 16 | Ht 63.0 in | Wt 186.0 lb

## 2023-11-10 DIAGNOSIS — G8929 Other chronic pain: Secondary | ICD-10-CM | POA: Diagnosis not present

## 2023-11-10 DIAGNOSIS — M5416 Radiculopathy, lumbar region: Secondary | ICD-10-CM

## 2023-11-10 MED ORDER — DEXAMETHASONE SODIUM PHOSPHATE 10 MG/ML IJ SOLN
10.0000 mg | Freq: Once | INTRAMUSCULAR | Status: AC
  Administered 2023-11-10: 10 mg

## 2023-11-10 MED ORDER — IOHEXOL 180 MG/ML  SOLN
10.0000 mL | Freq: Once | INTRAMUSCULAR | Status: AC
  Administered 2023-11-10: 10 mL via EPIDURAL

## 2023-11-10 MED ORDER — ROPIVACAINE HCL 2 MG/ML IJ SOLN
2.0000 mL | Freq: Once | INTRAMUSCULAR | Status: AC
  Administered 2023-11-10: 2 mL via EPIDURAL

## 2023-11-10 MED ORDER — GLYCOPYRROLATE 0.2 MG/ML IJ SOLN
0.2000 mg | Freq: Once | INTRAMUSCULAR | Status: AC
  Administered 2023-11-10: 0.2 mg via INTRAMUSCULAR

## 2023-11-10 MED ORDER — LIDOCAINE HCL 2 % IJ SOLN
20.0000 mL | Freq: Once | INTRAMUSCULAR | Status: AC
  Administered 2023-11-10: 400 mg

## 2023-11-10 MED ORDER — SODIUM CHLORIDE 0.9% FLUSH
2.0000 mL | Freq: Once | INTRAVENOUS | Status: AC
  Administered 2023-11-10: 2 mL

## 2023-11-10 MED ORDER — ROPIVACAINE HCL 2 MG/ML IJ SOLN
INTRAMUSCULAR | Status: AC
  Filled 2023-11-10: qty 20

## 2023-11-10 MED ORDER — LIDOCAINE HCL 2 % IJ SOLN
INTRAMUSCULAR | Status: AC
Start: 2023-11-10 — End: ?
  Filled 2023-11-10: qty 20

## 2023-11-10 MED ORDER — SODIUM CHLORIDE (PF) 0.9 % IJ SOLN
INTRAMUSCULAR | Status: AC
  Filled 2023-11-10: qty 10

## 2023-11-10 MED ORDER — DEXAMETHASONE SODIUM PHOSPHATE 10 MG/ML IJ SOLN
INTRAMUSCULAR | Status: AC
  Filled 2023-11-10: qty 1

## 2023-11-10 MED ORDER — IOHEXOL 180 MG/ML  SOLN
INTRAMUSCULAR | Status: AC
Start: 2023-11-10 — End: ?
  Filled 2023-11-10: qty 20

## 2023-11-10 NOTE — Progress Notes (Signed)
PROVIDER NOTE: Interpretation of information contained herein should be left to medically-trained personnel. Specific patient instructions are provided elsewhere under "Patient Instructions" section of medical record. This document was created in part using STT-dictation technology, any transcriptional errors that may result from this process are unintentional.  Patient: [name] Type: Established DOB: [birth year] MRN: [id number] PCP: Duanne Limerick, MD  Service: Procedure DOS: 11/10/2023 Setting: Ambulatory Location: Ambulatory outpatient facility Delivery: Face-to-face Provider: Edward Jolly, MD Specialty: Interventional Pain Management Specialty designation: 09 Location: Outpatient facility Ref. Prov.: Duanne Limerick, MD       Interventional Therapy   Type: Lumbar epidural steroid injection (LESI) (interlaminar) #1    Laterality: Midline   Level:  L5-S1 Level.  Imaging: Fluoroscopic guidance         Anesthesia: Local anesthesia (1-2% Lidocaine) DOS: 11/10/2023  Performed by: Edward Jolly, MD  Purpose: Diagnostic/Therapeutic Indications: Lumbar radicular pain of intraspinal etiology of more than 4 weeks that has failed to respond to conservative therapy and is severe enough to impact quality of life or function. 1. Chronic radicular lumbar pain   2. Lumbar radiculopathy    NAS-11 Pain score:   Pre-procedure: 6 /10   Post-procedure: 0-No pain/10      Position / Prep / Materials:  Position: Prone w/ head of the table raised (slight reverse trendelenburg) to facilitate breathing.  Prep solution: ChloraPrep (2% chlorhexidine gluconate and 70% isopropyl alcohol) Prep Area: Entire Posterior Lumbar Region from lower scapular tip down to mid buttocks area and from flank to flank. Materials:  Tray: Epidural tray Needle(s):  Type: Epidural needle (Tuohy) Gauge (G):  22 Length: Regular (3.5-in) Qty: 1   H&P (Pre-op Assessment):  [name] is a 28 y.o. (year old),  female patient, seen today for interventional treatment. She  has a past surgical history that includes Cesarean section; Cholecystectomy; Tonsillectomy; laparoscopy (2012); and Cesarean section (N/A, 03/04/2018). [name] has a current medication list which includes the following prescription(s): amphetamine-dextroamphetamine, amphetamine-dextroamphetamine, cyclobenzaprine, diclofenac, duloxetine, meloxicam, pregabalin, venlafaxine xr, prednisone, sumatriptan, [DISCONTINUED] promethazine, and [DISCONTINUED] simethicone, and the following Facility-Administered Medications: glycopyrrolate. Her primarily concern today is the Back Pain (Lumbar midline )  Initial Vital Signs:  Pulse/HCG Rate: (!) 103  Temp: 98.4 F (36.9 C) Resp: 16 BP: 120/84 SpO2: 98 %  BMI: Estimated body mass index is 32.95 kg/m as calculated from the following:   Height as of this encounter: 5\' 3"  (1.6 m).   Weight as of this encounter: 186 lb (84.4 kg).  Risk Assessment: Allergies: Reviewed. She is allergic to hydrocodone, aspirin, pollen extract, shellfish allergy, zoloft [sertraline hcl], and mold extract [trichophyton mentagrophyte].  Allergy Precautions: None required Coagulopathies: Reviewed. None identified.  Blood-thinner therapy: None at this time Active Infection(s): Reviewed. None identified. [name] is afebrile  Site Confirmation: [name] was asked to confirm the procedure and laterality before marking the site Procedure checklist: Completed Consent: Before the procedure and under the influence of no sedative(s), amnesic(s), or anxiolytics, the patient was informed of the treatment options, risks and possible complications. To fulfill our ethical and legal obligations, as recommended by the American Medical Association's Code of Ethics, I have informed the patient of my clinical impression; the nature and purpose of the treatment or procedure; the risks, benefits, and possible complications of the  intervention; the alternatives, including doing nothing; the risk(s) and benefit(s) of the alternative treatment(s) or procedure(s); and the risk(s) and benefit(s) of doing nothing. The patient was provided information about the general  risks and possible complications associated with the procedure. These may include, but are not limited to: failure to achieve desired goals, infection, bleeding, organ or nerve damage, allergic reactions, paralysis, and death. In addition, the patient was informed of those risks and complications associated to Spine-related procedures, such as failure to decrease pain; infection (i.e.: Meningitis, epidural or intraspinal abscess); bleeding (i.e.: epidural hematoma, subarachnoid hemorrhage, or any other type of intraspinal or peri-dural bleeding); organ or nerve damage (i.e.: Any type of peripheral nerve, nerve root, or spinal cord injury) with subsequent damage to sensory, motor, and/or autonomic systems, resulting in permanent pain, numbness, and/or weakness of one or several areas of the body; allergic reactions; (i.e.: anaphylactic reaction); and/or death. Furthermore, the patient was informed of those risks and complications associated with the medications. These include, but are not limited to: allergic reactions (i.e.: anaphylactic or anaphylactoid reaction(s)); adrenal axis suppression; blood sugar elevation that in diabetics may result in ketoacidosis or comma; water retention that in patients with history of congestive heart failure may result in shortness of breath, pulmonary edema, and decompensation with resultant heart failure; weight gain; swelling or edema; medication-induced neural toxicity; particulate matter embolism and blood vessel occlusion with resultant organ, and/or nervous system infarction; and/or aseptic necrosis of one or more joints. Finally, the patient was informed that Medicine is not an exact science; therefore, there is also the possibility of  unforeseen or unpredictable risks and/or possible complications that may result in a catastrophic outcome. The patient indicated having understood very clearly. We have given the patient no guarantees and we have made no promises. Enough time was given to the patient to ask questions, all of which were answered to the patient's satisfaction. Ms. Agent has indicated that she wanted to continue with the procedure. Attestation: I, the ordering provider, attest that I have discussed with the patient the benefits, risks, side-effects, alternatives, likelihood of achieving goals, and potential problems during recovery for the procedure that I have provided informed consent. Date  Time: 11/10/2023  1:02 PM   Pre-Procedure Preparation:  Monitoring: As per clinic protocol. Respiration, ETCO2, SpO2, BP, heart rate and rhythm monitor placed and checked for adequate function Safety Precautions: Patient was assessed for positional comfort and pressure points before starting the procedure. Time-out: I initiated and conducted the "Time-out" before starting the procedure, as per protocol. The patient was asked to participate by confirming the accuracy of the "Time Out" information. Verification of the correct person, site, and procedure were performed and confirmed by me, the nursing staff, and the patient. "Time-out" conducted as per Joint Commission's Universal Protocol (UP.01.01.01). Time: 1332 Start Time: 1332 hrs.  Description/Narrative of Procedure:          Target: Epidural space via interlaminar opening, initially targeting the lower laminar border of the superior vertebral body. Region: Lumbar Approach: Percutaneous paravertebral  Rationale (medical necessity): procedure needed and proper for the diagnosis and/or treatment of the patient's medical symptoms and needs. Procedural Technique Safety Precautions: Aspiration looking for blood return was conducted prior to all injections. At no point did we  inject any substances, as a needle was being advanced. No attempts were made at seeking any paresthesias. Safe injection practices and needle disposal techniques used. Medications properly checked for expiration dates. SDV (single dose vial) medications used. Description of the Procedure: Protocol guidelines were followed. The procedure needle was introduced through the skin, ipsilateral to the reported pain, and advanced to the target area. Bone was contacted and the needle walked caudad,  until the lamina was cleared. The epidural space was identified using "loss-of-resistance technique" with 2-3 ml of PF-NaCl (0.9% NSS), in a 5cc LOR glass syringe.  Vitals:   11/10/23 1332 11/10/23 1344 11/10/23 1346 11/10/23 1354  BP: (!) 83/53 112/85 (!) 92/39 108/65  Pulse: 68 61 (!) 48 69  Resp: 15 15 16 16   Temp:      TempSrc:      SpO2: 99% 99% 98% 98%  Weight:      Height:        Start Time: 1332 hrs. End Time: 1346 hrs.  Imaging Guidance (Spinal):          Type of Imaging Technique: Fluoroscopy Guidance (Spinal) Indication(s): Fluoroscopy guidance for needle placement to enhance accuracy in procedures requiring precise needle localization for targeted delivery of medication in or near specific anatomical locations not easily accessible without such real-time imaging assistance. Exposure Time: Please see nurses notes. Contrast: Before injecting any contrast, we confirmed that the patient did not have an allergy to iodine, shellfish, or radiological contrast. Once satisfactory needle placement was completed at the desired level, radiological contrast was injected. Contrast injected under live fluoroscopy. No contrast complications. See chart for type and volume of contrast used. Fluoroscopic Guidance: I was personally present during the use of fluoroscopy. "Tunnel Vision Technique" used to obtain the best possible view of the target area. Parallax error corrected before commencing the procedure.  "Direction-depth-direction" technique used to introduce the needle under continuous pulsed fluoroscopy. Once target was reached, antero-posterior, oblique, and lateral fluoroscopic projection used confirm needle placement in all planes. Images permanently stored in EMR. Interpretation: I personally interpreted the imaging intraoperatively. Adequate needle placement confirmed in multiple planes. Appropriate spread of contrast into desired area was observed. No evidence of afferent or efferent intravascular uptake. No intrathecal or subarachnoid spread observed. Permanent images saved into the patient's record.  Antibiotic Prophylaxis:   Anti-infectives (From admission, onward)    None      Indication(s): None identified  Post-operative Assessment:  Post-procedure Vital Signs:  Pulse/HCG Rate: 69  Temp: 98.4 F (36.9 C) Resp: 16 BP: 108/65 SpO2: 98 %  EBL: None  Complications: No immediate post-treatment complications observed by team, or reported by patient.  Note:  Patient did have a vagal reaction in the middle of her procedure.  She was given a cold washcloth and procedure was paused.  She elected to continue with procedure.  After epidural was completed, she had another vasovagal reaction and received 0.2 mg of glycopyrrolate.  She was monitored in recovery and her symptoms improved.  Vital signs stable.  A repeat set of vitals were taken after the procedure and the patient was kept under observation following institutional policy, for this type of procedure. Post-procedural neurological assessment was performed, showing return to baseline, prior to discharge. The patient was provided with post-procedure discharge instructions, including a section on how to identify potential problems. Should any problems arise concerning this procedure, the patient was given instructions to immediately contact us, at any time, without hesitation. In any case, we plan to contact the patient by telephone  for a follow-up status report regarding this interventional procedure.  Comments:  No additional relevant information.  Plan of Care (POC)  Orders:  Orders Placed This Encounter  Procedures   DG PAIN CLINIC C-ARM 1-60 MIN NO REPORT    Intraoperative interpretation by procedural physician at [facility].    Standing Status:   Standing    Number of Occurrences:  1    Reason for exam::   Assistance in needle guidance and placement for procedures requiring needle placement in or near specific anatomical locations not easily accessible without such assistance.    Medications ordered for procedure: Meds ordered this encounter  Medications   iohexol (OMNIPAQUE) 180 MG/ML injection 10 mL    Must be Myelogram-compatible. If not available, you may substitute with a water-soluble, non-ionic, hypoallergenic, myelogram-compatible radiological contrast medium.   lidocaine (XYLOCAINE) 2 % (with pres) injection 400 mg   ropivacaine (PF) 2 mg/mL (0.2%) (NAROPIN) injection 2 mL   sodium chloride flush (NS) 0.9 % injection 2 mL   dexamethasone (DECADRON) injection 10 mg   glycopyrrolate (ROBINUL) injection 0.2 mg   Medications administered: We administered iohexol, lidocaine, ropivacaine (PF) 2 mg/mL (0.2%), sodium chloride flush, and dexamethasone.  See the medical record for exact dosing, route, and time of administration.  Follow-up plan:   Return in about 4 weeks (around 12/08/2023) for PPE, VV.       L5-S1 ESI 11/10/2023    Recent Visits Date Type Provider Dept  09/07/23 Office Visit Edward Jolly, MD [facility]  Showing recent visits within past 90 days and meeting all other requirements Today's Visits Date Type Provider Dept  11/10/23 Procedure visit Edward Jolly, MD [facility]  Showing today's visits and meeting all other requirements Future Appointments Date Type Provider Dept  12/06/23 Appointment Edward Jolly, MD [facility]  Showing  future appointments within next 90 days and meeting all other requirements  Disposition: Discharge home  Discharge (Date  Time): 11/10/2023; 1403 hrs.   Primary Care Physician: Duanne Limerick, MD Location: [facility] Note by: Edward Jolly, MD (TTS technology used. I apologize for any typographical errors that were not detected and corrected.) Date: 11/10/2023; Time: 2:22 PM  Disclaimer:  Medicine is not an Visual merchandiser. The only guarantee in medicine is that nothing is guaranteed. It is important to note that the decision to proceed with this intervention was based on the information collected from the patient. The Data and conclusions were drawn from the patient's questionnaire, the interview, and the physical examination. Because the information was provided in large part by the patient, it cannot be guaranteed that it has not been purposely or unconsciously manipulated. Every effort has been made to obtain as much relevant data as possible for this evaluation. It is important to note that the conclusions that lead to this procedure are derived in large part from the available data. Always take into account that the treatment will also be dependent on availability of resources and existing treatment guidelines, considered by other Pain Management Practitioners as being common knowledge and practice, at the time of the intervention. For Medico-Legal purposes, it is also important to point out that variation in procedural techniques and pharmacological choices are the acceptable norm. The indications, contraindications, technique, and results of the above procedure should only be interpreted and judged by a Board-Certified Interventional Pain Specialist with extensive familiarity and expertise in the same exact procedure and technique.

## 2023-11-10 NOTE — Patient Instructions (Signed)

## 2023-11-10 NOTE — Progress Notes (Signed)
Patient to recovery, A&OX3.

## 2023-11-11 ENCOUNTER — Telehealth: Payer: Self-pay

## 2023-11-11 ENCOUNTER — Encounter: Payer: Self-pay | Admitting: Family Medicine

## 2023-11-11 ENCOUNTER — Other Ambulatory Visit: Payer: Self-pay

## 2023-11-11 ENCOUNTER — Ambulatory Visit: Payer: 59 | Admitting: Family Medicine

## 2023-11-11 DIAGNOSIS — D72818 Other decreased white blood cell count: Secondary | ICD-10-CM | POA: Diagnosis not present

## 2023-11-11 NOTE — Telephone Encounter (Signed)
Post procedure follow up.  Patient states she is doing well.   ?

## 2023-11-12 ENCOUNTER — Encounter: Payer: Self-pay | Admitting: Family Medicine

## 2023-11-12 LAB — CBC WITH DIFFERENTIAL/PLATELET
Basophils Absolute: 0 10*3/uL (ref 0.0–0.2)
Basos: 0 %
EOS (ABSOLUTE): 0 10*3/uL (ref 0.0–0.4)
Eos: 0 %
Hematocrit: 44.8 % (ref 34.0–46.6)
Hemoglobin: 15.3 g/dL (ref 11.1–15.9)
Immature Grans (Abs): 0.1 10*3/uL (ref 0.0–0.1)
Immature Granulocytes: 1 %
Lymphocytes Absolute: 1.4 10*3/uL (ref 0.7–3.1)
Lymphs: 12 %
MCH: 31.3 pg (ref 26.6–33.0)
MCHC: 34.2 g/dL (ref 31.5–35.7)
MCV: 92 fL (ref 79–97)
Monocytes Absolute: 0.9 10*3/uL (ref 0.1–0.9)
Monocytes: 7 %
Neutrophils Absolute: 9.2 10*3/uL — ABNORMAL HIGH (ref 1.4–7.0)
Neutrophils: 80 %
Platelets: 222 10*3/uL (ref 150–450)
RBC: 4.89 x10E6/uL (ref 3.77–5.28)
RDW: 11.9 % (ref 11.7–15.4)
WBC: 11.5 10*3/uL — ABNORMAL HIGH (ref 3.4–10.8)

## 2023-11-16 ENCOUNTER — Other Ambulatory Visit (HOSPITAL_COMMUNITY): Payer: Self-pay

## 2023-11-16 ENCOUNTER — Other Ambulatory Visit: Payer: Self-pay

## 2023-11-18 ENCOUNTER — Other Ambulatory Visit (HOSPITAL_COMMUNITY): Payer: Self-pay

## 2023-11-18 ENCOUNTER — Other Ambulatory Visit: Payer: Self-pay

## 2023-11-18 DIAGNOSIS — F411 Generalized anxiety disorder: Secondary | ICD-10-CM | POA: Diagnosis not present

## 2023-11-18 DIAGNOSIS — F5105 Insomnia due to other mental disorder: Secondary | ICD-10-CM | POA: Diagnosis not present

## 2023-11-18 DIAGNOSIS — F908 Attention-deficit hyperactivity disorder, other type: Secondary | ICD-10-CM | POA: Diagnosis not present

## 2023-11-18 DIAGNOSIS — D519 Vitamin B12 deficiency anemia, unspecified: Secondary | ICD-10-CM | POA: Diagnosis not present

## 2023-11-18 DIAGNOSIS — F330 Major depressive disorder, recurrent, mild: Secondary | ICD-10-CM | POA: Diagnosis not present

## 2023-11-18 MED ORDER — DULOXETINE HCL 30 MG PO CPEP
30.0000 mg | ORAL_CAPSULE | Freq: Every day | ORAL | 0 refills | Status: DC
  Filled 2023-11-18: qty 90, 90d supply, fill #0

## 2023-11-18 MED ORDER — AMPHETAMINE-DEXTROAMPHETAMINE 10 MG PO TABS
10.0000 mg | ORAL_TABLET | Freq: Every day | ORAL | 0 refills | Status: DC
  Filled 2023-11-18 (×2): qty 90, 90d supply, fill #0

## 2023-11-18 MED ORDER — DULOXETINE HCL 60 MG PO CPEP
60.0000 mg | ORAL_CAPSULE | Freq: Every day | ORAL | 0 refills | Status: DC
  Filled 2023-11-18: qty 90, 90d supply, fill #0

## 2023-11-18 MED ORDER — AMPHETAMINE-DEXTROAMPHET ER 30 MG PO CP24
30.0000 mg | ORAL_CAPSULE | Freq: Every day | ORAL | 0 refills | Status: DC
  Filled 2023-11-18 (×2): qty 90, 90d supply, fill #0

## 2023-11-22 ENCOUNTER — Other Ambulatory Visit: Payer: Self-pay | Admitting: Family Medicine

## 2023-11-23 NOTE — Telephone Encounter (Signed)
Requested Prescriptions  Pending Prescriptions Disp Refills   diclofenac (VOLTAREN) 75 MG EC tablet [Pharmacy Med Name: DICLOFENAC SOD EC 75 MG TAB] 180 tablet 0    Sig: TAKE 1 TABLET BY MOUTH 2 TIMES DAILY AS NEEDED.     Analgesics:  NSAIDS Failed - 11/23/2023  8:58 AM      Failed - Manual Review: Labs are only required if the patient has taken medication for more than 8 weeks.      Passed - Cr in normal range and within 360 days    Creatinine  Date Value Ref Range Status  09/19/2013 0.69 0.60 - 1.30 mg/dL Final   Creatinine, Ser  Date Value Ref Range Status  05/17/2023 0.61 0.57 - 1.00 mg/dL Final         Passed - HGB in normal range and within 360 days    Hemoglobin  Date Value Ref Range Status  11/11/2023 15.3 11.1 - 15.9 g/dL Final         Passed - PLT in normal range and within 360 days    Platelets  Date Value Ref Range Status  11/11/2023 222 150 - 450 x10E3/uL Final         Passed - HCT in normal range and within 360 days    Hematocrit  Date Value Ref Range Status  11/11/2023 44.8 34.0 - 46.6 % Final         Passed - eGFR is 30 or above and within 360 days    EGFR (African American)  Date Value Ref Range Status  09/19/2013 >60  Final   GFR calc Af Amer  Date Value Ref Range Status  05/01/2020 148 >59 mL/min/1.73 Final    Comment:    **[facility] currently reports eGFR in compliance with the current**   recommendations of the [employer]. [facility] will   update reporting as new guidelines are published from the NKF-ASN   Task force.    EGFR (Non-African Amer.)  Date Value Ref Range Status  09/19/2013 >60  Final    Comment:    eGFR values <45mL/min/1.73 m2 may be an indication of chronic kidney disease (CKD). Calculated eGFR is useful in patients with stable renal function. The eGFR calculation will not be reliable in acutely ill patients when serum creatinine is changing rapidly. It is not useful in  patients on dialysis. The eGFR  calculation may not be applicable to patients at the low and high extremes of body sizes, pregnant women, and vegetarians.    GFR calc non Af Amer  Date Value Ref Range Status  05/01/2020 129 >59 mL/min/1.73 Final   eGFR  Date Value Ref Range Status  05/17/2023 125 >59 mL/min/1.73 Final         Passed - Patient is not pregnant      Passed - Valid encounter within last 12 months    Recent Outpatient Visits           4 weeks ago Carpal tunnel syndrome on right   [facility], Ocie Bob, MD   3 months ago Chronic hand pain, unspecified laterality   [place] [facility] Inches, MD   3 months ago Disorder of rotator cuff, right   [facility], Ocie Bob, MD   6 months ago Annual physical exam   [facility]  at [facility] Inches, MD   10 months ago Right cervical radiculopathy   [facility] at [facility], Ocie Bob, MD

## 2023-12-03 ENCOUNTER — Encounter: Payer: Self-pay | Admitting: Family Medicine

## 2023-12-03 ENCOUNTER — Ambulatory Visit: Payer: 59 | Admitting: Family Medicine

## 2023-12-03 VITALS — BP 130/80 | HR 101 | Temp 98.1°F | Resp 18 | Ht 63.0 in | Wt 192.0 lb

## 2023-12-03 DIAGNOSIS — M47814 Spondylosis without myelopathy or radiculopathy, thoracic region: Secondary | ICD-10-CM | POA: Diagnosis not present

## 2023-12-03 DIAGNOSIS — H04129 Dry eye syndrome of unspecified lacrimal gland: Secondary | ICD-10-CM

## 2023-12-03 DIAGNOSIS — F411 Generalized anxiety disorder: Secondary | ICD-10-CM

## 2023-12-03 DIAGNOSIS — M2550 Pain in unspecified joint: Secondary | ICD-10-CM

## 2023-12-03 DIAGNOSIS — Z7689 Persons encountering health services in other specified circumstances: Secondary | ICD-10-CM | POA: Diagnosis not present

## 2023-12-03 DIAGNOSIS — F900 Attention-deficit hyperactivity disorder, predominantly inattentive type: Secondary | ICD-10-CM | POA: Diagnosis not present

## 2023-12-03 DIAGNOSIS — J069 Acute upper respiratory infection, unspecified: Secondary | ICD-10-CM

## 2023-12-03 DIAGNOSIS — R682 Dry mouth, unspecified: Secondary | ICD-10-CM

## 2023-12-03 DIAGNOSIS — Z0000 Encounter for general adult medical examination without abnormal findings: Secondary | ICD-10-CM | POA: Diagnosis not present

## 2023-12-03 DIAGNOSIS — F330 Major depressive disorder, recurrent, mild: Secondary | ICD-10-CM

## 2023-12-03 DIAGNOSIS — G5603 Carpal tunnel syndrome, bilateral upper limbs: Secondary | ICD-10-CM

## 2023-12-03 NOTE — Progress Notes (Signed)
New Patient Office Visit  Subjective   Patient ID: [name], female    DOB: 06-18-1996  Age: 28 y.o. MRN: [id number]  CC:  Chief Complaint  Patient presents with   Establish Care   URI   Cough   Nasal Congestion   HPI [name] is a 28 year old female who presents to establish with [facility] at [facility].   CC: Patient here to establish care  Last PCP: Primary Care & Sports Medicine at [facility], Dr. Yetta Barre  Specialists: Dr. Ashley Royalty (sports med), carpal tunnel syndrome   ADHD: Adderall XR 30mg  & 10mg  in the afternoon PRN  Mood: Cymbalta 60mg  in morning & 30mg  in the evening and Effexor 75mg   Chronic back pain/lumbar spondylosis: flexeril PRN  Bilateral carpal tunnel  Dryness: eye are dry  "Dentist says I do not produce enough saliva"  Random soreness- usually in the morning  Back pain- denies trauma/injury  Presents today for an acute visit with complaint of nasal congestion x2 days.  Associated symptoms include: cough, nasal congestion, sinus pressure, slight sore throat  Pertinent negatives: fever/chills, headache, shob, chest pain, fatigue Treatments tried include: Mucinex  Treatment effective:  Sick contacts: RSV from son (40 years old)      12/03/2023    8:41 AM 10/26/2023    4:27 PM 08/16/2023   10:46 AM 07/27/2023    3:26 PM  GAD 7 : Generalized Anxiety Score  Nervous, Anxious, on Edge 2 2 2  0  Control/stop worrying 1 1 2  0  Worry too much - different things 2 1 2  0  Trouble relaxing 1 1 2  0  Restless 0 0 2 0  Easily annoyed or irritable 1 1 2  0  Afraid - awful might happen 0 0 1 0  Total GAD 7 Score 7 6 13  0  Anxiety Difficulty Somewhat difficult Somewhat difficult Somewhat difficult Not difficult at all       12/03/2023    8:41 AM 11/10/2023    1:22 PM 10/26/2023    4:26 PM  PHQ9 SCORE ONLY  PHQ-9 Total Score 6 3 6     Outpatient Encounter Medications as of 12/03/2023  Medication Sig    amphetamine-dextroamphetamine (ADDERALL XR) 30 MG 24 hr capsule Take 1 capsule (30 mg total) by mouth daily with breakfast.   amphetamine-dextroamphetamine (ADDERALL) 10 MG tablet Take 1 tablet (10 mg total) by mouth daily at 5pm.   cyclobenzaprine (FLEXERIL) 10 MG tablet Take 1 tablet (10 mg total) by mouth 3 (three) times daily as needed for muscle spasms.   diclofenac (VOLTAREN) 75 MG EC tablet TAKE 1 TABLET BY MOUTH 2 TIMES DAILY AS NEEDED.   DULoxetine (CYMBALTA) 30 MG capsule Take 1 capsule (30 mg total) by mouth at bedtime.   DULoxetine (CYMBALTA) 60 MG capsule Take 1 capsule (60 mg total) by mouth daily.   venlafaxine XR (EFFEXOR-XR) 75 MG 24 hr capsule    [DISCONTINUED] amphetamine-dextroamphetamine (ADDERALL XR) 30 MG 24 hr capsule Take 1 capsule (30 mg total) by mouth daily with breakfast.   [DISCONTINUED] amphetamine-dextroamphetamine (ADDERALL) 10 MG tablet Take 1 tablet (10 mg total) by mouth daily at 5 pm.   [DISCONTINUED] pregabalin (LYRICA) 50 MG capsule Take 1 capsule (50 mg total) by mouth at bedtime as needed. (Patient taking differently: Take 75 mg by mouth at bedtime as needed.)   [DISCONTINUED] DULoxetine (CYMBALTA) 60 MG capsule Take 1 capsule (60 mg total) by mouth daily.   [DISCONTINUED] predniSONE (DELTASONE) 50  MG tablet Take 1 tablet (50 mg total) by mouth daily. (Patient not taking: Reported on 11/10/2023)   [DISCONTINUED] promethazine (PHENERGAN) 12.5 MG tablet Take 12.5 mg by mouth every 6 (six) hours as needed for nausea or vomiting.   [DISCONTINUED] simethicone (MYLICON) 80 MG chewable tablet Chew 1 tablet (80 mg total) by mouth as needed for flatulence.   [DISCONTINUED] SUMAtriptan (IMITREX) 50 MG tablet Take 1 tablet (50 mg total) by mouth at onset of migraine. May repeat in 2 hours if headache persists or recurs. (Patient not taking: Reported on 10/26/2023)   No facility-administered encounter medications on file as of 12/03/2023.   Patient Active Problem List    Diagnosis Date Noted   Carpal tunnel syndrome on right 10/26/2023   Thoracic spondylosis 08/02/2023   Bilateral occipital neuralgia 03/18/2023   Chronic bilateral low back pain without sciatica 03/18/2023   Degenerative cervical disc 03/18/2023   Disorder of rotator cuff, right 01/11/2023   Right cervical radiculopathy 01/11/2023   Cesarean delivery delivered - Repeat 5/24 03/04/2018   Postpartum care following vaginal delivery 03/04/2018   Seizure-like activity (HCC) 11/30/2017   Impaired glucose tolerance test 09/23/2017   Chronic pelvic pain in female 02/01/2012   Past Medical History:  Diagnosis Date   Endometriosis    Gestational diabetes    diet controlled   Otitis media    Seizures (HCC)    Past Surgical History:  Procedure Laterality Date   CESAREAN SECTION     CESAREAN SECTION N/A 03/04/2018   Procedure: Repeat CESAREAN SECTION;  Surgeon: Vick Frees, MD;  Location: [facility] BIRTHING SUIT[facility];  Service: Obstetrics;  Laterality: N/A;  EDD: 03/10/18 Allergy: Aspirin, Shellfish   CHOLECYSTECTOMY     LAPAROSCOPY  2012   diagnose endometriosis   TONSILLECTOMY     Family History  Problem Relation Age of Onset   Diabetes Mother    Hearing loss Mother    Lupus Mother        diagnosed age 17   Sjogren's syndrome Mother        diagnosed age 91   Cirrhosis Mother        stage 1   Uterine cancer Mother    Ovarian cancer Mother    Other Mother        breast mass   Rheum arthritis Mother        diagnosed age 37   Healthy Father    Social History   Socioeconomic History   Marital status: Married    Spouse name: Not on file   Number of children: 2   Years of education: some college   Highest education level: Associate degree: academic program  Occupational History   Occupation: CNA  Tobacco Use   Smoking status: Never    Passive exposure: Never   Smokeless tobacco: Never  Vaping Use   Vaping status: Never Used  Substance and Sexual Activity   Alcohol use: Yes     Alcohol/week: 2.0 standard drinks of alcohol    Types: 2 Glasses of wine per week   Drug use: No   Sexual activity: Yes  Other Topics Concern   Not on file  Social History Narrative   Lives at home with husband and son. She is currently expecting her second child.   Right-handed.   No caffeine use.   Social Drivers of Health   Financial Resource Strain: Low Risk  (10/26/2023)   Overall Financial Resource Strain (CARDIA)    Difficulty of Paying Living Expenses:  Not very hard  Food Insecurity: Food Insecurity Present (10/26/2023)   Hunger Vital Sign    Worried About Running Out of Food in the Last Year: Sometimes true    Ran Out of Food in the Last Year: Sometimes true  Transportation Needs: No Transportation Needs (10/26/2023)   PRAPARE - Administrator, Civil Service (Medical): No    Lack of Transportation (Non-Medical): No  Physical Activity: Sufficiently Active (10/26/2023)   Exercise Vital Sign    Days of Exercise per Week: 4 days    Minutes of Exercise per Session: 60 min  Stress: Stress Concern Present (10/26/2023)   [employer] of Occupational Health - Occupational Stress Questionnaire    Feeling of Stress : To some extent  Social Connections: Moderately Integrated (10/26/2023)   Social Connection and Isolation Panel [NHANES]    Frequency of Communication with Friends and Family: More than three times a week    Frequency of Social Gatherings with Friends and Family: Three times a week    Attends Religious Services: 1 to 4 times per year    Active Member of Clubs or Organizations: No    Attends Engineer, structural: Not on file    Marital Status: Married  Catering manager Violence: Not on file   Outpatient Medications Prior to Visit  Medication Sig Dispense Refill   amphetamine-dextroamphetamine (ADDERALL XR) 30 MG 24 hr capsule Take 1 capsule (30 mg total) by mouth daily with breakfast. 90 capsule 0   amphetamine-dextroamphetamine (ADDERALL)  10 MG tablet Take 1 tablet (10 mg total) by mouth daily at 5pm. 90 tablet 0   cyclobenzaprine (FLEXERIL) 10 MG tablet Take 1 tablet (10 mg total) by mouth 3 (three) times daily as needed for muscle spasms. 30 tablet 0   diclofenac (VOLTAREN) 75 MG EC tablet TAKE 1 TABLET BY MOUTH 2 TIMES DAILY AS NEEDED. 180 tablet 0   DULoxetine (CYMBALTA) 30 MG capsule Take 1 capsule (30 mg total) by mouth at bedtime. 90 capsule 0   DULoxetine (CYMBALTA) 60 MG capsule Take 1 capsule (60 mg total) by mouth daily. 90 capsule 0   venlafaxine XR (EFFEXOR-XR) 75 MG 24 hr capsule      amphetamine-dextroamphetamine (ADDERALL XR) 30 MG 24 hr capsule Take 1 capsule (30 mg total) by mouth daily with breakfast. 90 capsule 0   amphetamine-dextroamphetamine (ADDERALL) 10 MG tablet Take 1 tablet (10 mg total) by mouth daily at 5 pm. 90 tablet 0   pregabalin (LYRICA) 50 MG capsule Take 1 capsule (50 mg total) by mouth at bedtime as needed. (Patient taking differently: Take 75 mg by mouth at bedtime as needed.) 90 capsule 2   DULoxetine (CYMBALTA) 60 MG capsule Take 1 capsule (60 mg total) by mouth daily. 90 capsule 0   predniSONE (DELTASONE) 50 MG tablet Take 1 tablet (50 mg total) by mouth daily. (Patient not taking: Reported on 11/10/2023) 7 tablet 0   SUMAtriptan (IMITREX) 50 MG tablet Take 1 tablet (50 mg total) by mouth at onset of migraine. May repeat in 2 hours if headache persists or recurs. (Patient not taking: Reported on 10/26/2023) 30 tablet 1   No facility-administered medications prior to visit.   Allergies  Allergen Reactions   Hydrocodone Hives, Shortness Of Breath and Rash   Sertraline Rash   Shellfish-Derived Products Nausea And Vomiting   Aspirin Hives and Other (See Comments)    Pt states that she can tolerate ibuprofen   Pollen Extract Other (See Comments)  Seasonal allergy   Shellfish Allergy Nausea And Vomiting   Zoloft [Sertraline Hcl]    Mold Extract [Trichophyton Mentagrophyte] Itching and Rash    ROS: see HPI   Objective   Today's Vitals   12/03/23 0838  BP: 130/80  Pulse: (!) 101  Resp: 18  Temp: 98.1 F (36.7 C)  TempSrc: Oral  SpO2: 98%  Weight: 192 lb (87.1 kg)  Height: 5\' 3"  (1.6 m)  PainSc: 0-No pain   GENERAL: Well-appearing, in NAD. Well nourished.  SKIN: Pink, warm and dry. No rash, lesion, ulceration, or ecchymoses.  Head: Normocephalic. NECK: Trachea midline. Full ROM w/o pain or tenderness. No lymphadenopathy.  EARS: Tympanic membranes are intact, translucent without bulging and without drainage. Appropriate landmarks visualized.  EYES: Conjunctiva clear without exudates. EOMI, PERRL, no drainage present.  NOSE: Septum midline w/o deformity. Nares patent, mucosa pink and non-inflamed w/o drainage. No sinus tenderness.  THROAT: Uvula midline. Oropharynx clear. Tonsils non-inflamed without exudate. Mucous membranes pink and moist.  RESPIRATORY: Chest wall symmetrical. Respirations even and non-labored. Breath sounds clear to auscultation bilaterally.  CARDIAC: S1, S2 present, regular rate and rhythm without murmur or gallops. Peripheral pulses 2+ bilaterally.  MSK: Muscle tone and strength appropriate for age. Joints w/o tenderness, redness, or swelling.  EXTREMITIES: Without clubbing, cyanosis, or edema.  NEUROLOGIC: No motor or sensory deficits. Steady, even gait. C2-C12 intact.  PSYCH/MENTAL STATUS: Alert, oriented x 3. Cooperative, appropriate mood and affect.    Assessment & Plan:   1. Encounter to establish care (Primary) Patient is a 27- year-old female who presents today to establish care with primary care at [facility]. Reviewed the past medical history, family history, social history, surgical history, medications and allergies today- updates made as indicated. Patient has concerns today about URI and possible Sjgren's syndrome.   2. Dry eye Patient reports that she had her recent yearly eye exam and her ophthalmologist reported that her eyes are  excessively dry. Given family history of Sjgren's syndrome.  - TSH Rfx on Abnormal to Free T4 - Sed Rate (ESR) - Rheumatoid Factor - ANA Profile 12 (RDL)  3. Arthralgia, unspecified joint Chronic. Patient reports that she has joint pain in her hands that waxes and wanes but never completely resolves. No tenderness present of the MP, PIP or DIP joints. Given family history of RA, will re-evaluate ANA, ESR and RF.    4. Mouth dryness Due to maternal family history, will also assess thyroid function today. Will update patient with results.  - TSH Rfx on Abnormal to Free T4 - Sed Rate (ESR) - Rheumatoid Factor - ANA Profile 12 (RDL)  5. Thoracic spondylosis Patient does have bilateral back pain particularly in the L5-S1 area with corresponds with a bulging disc. Patient would like to have her rheumatology panel completed. Due to family history, would still be reasonable to place referral to endocrinology/rheumatology for assessment of Sjgren's syndrome.   6. Bilateral carpal tunnel syndrome See #3  7. Attention deficit hyperactivity disorder (ADHD), predominantly inattentive type Patient currently taking Adderall XR 30mg  daily & Adderall 10mg  in the afternoon as needed. Reports no adverse side effects and that current medication regimen is working well. No refills needed at this time.   8. GAD (generalized anxiety disorder) GAD7 completed with score of 7. Currently taking Cymbalta 60mg  daily & 30mg  in the evening, along with Effexor 75mg  daily. Reports current medication regimen is controlling her mood well. No refills needed.   9. MDD (major depressive disorder), recurrent episode,  mild (HCC) PHQ9 completed with score of 6. Denies SI/HI. Currently taking Cymbalta 60mg  daily & 30mg  in the evening, along with Effexor 75mg  daily. Reports current medication regimen is controlling her mood well. No refills needed.  10. Wellness examination Will complete fasting labs today and complete  annual physical in near future.  - CBC with Differential/Platelet - Comprehensive metabolic panel - Hemoglobin A1c - Lipid panel - TSH Rfx on Abnormal to Free T4  11. Viral URI with cough Patient presents today with cough, nasal congestion, sinus pressure, and slight sore throat. She has been around her son who was diagnosed with RSV. No acute findings on exam. No signs of systemic infection. Patient presents today with slightly elevated blood pressure. Patient in no acute distress and is well-appearing. Cardiovascular exam with heart regular rate and rhythm. Normal heart sounds, no murmurs present. No lower extremity edema present. Lungs clear to auscultation bilaterally. Likely viral URI. Discussed symptomatic treatment and to return to office if symptoms persist or worsen.     Return in about 6 weeks (around 01/14/2024) for Mood f/u & chronic conditions .   Spent 60 minutes on this patient encounter, including preparation, chart review, face-to-face counseling with patient and coordination of care, and documentation of encounter.    Alyson Reedy, FNP

## 2023-12-03 NOTE — Patient Instructions (Addendum)
You have a viral infection that will resolve on its own over time.  Symptomatic treatment is ideal at this time. Symptoms will last 3-7 days but can stretch out to 10-14 days. Call if you are not improving by 7-10 days as this may have progressed to a bacterial infection.  You can use nasal saline to help with nasal drainage/congestion. Humidification may also be beneficial.  Unfortunately, antibiotics are not helpful for viral infections.  Vitamin Regimen:  Vitamin C 500mg  twice daily  Vitamin D 5000 units once daily  Zinc 50-75mg  once daily   Over the counter Medications (*unless allergic or contraindicated*):  Use Tylenol (acetaminophen) for fever/pain Use Advil (ibuprofen) for fever/pain/inflammation   Non-Medication Therapy:  Drink plenty of fluids and stay hydrated.  A teaspoon of honey may help ease coughing symptoms.  Cough drops or hard candy for coughing.   Over the Counter Medication Therapy:  Use a cough expectorant such as guaifenesin (Mucinex) if recommended by your doctor for a wet, congested cough. If you have high blood pressure, please ask your doctor first before using this.  Use a cough suppressant such as dextromethorphan (Robitussin/Delsym) for a dry cough. If you have high blood pressure, please ask your doctor first before using this.     MyChart:  For all urgent or time sensitive needs we ask that you please call the office to avoid delays. Our number is [phone]. MyChart is not constantly monitored and due to the large volume of messages a day, replies may take up to 72 business hours.   MyChart Policy: MyChart allows for you to see your visit notes, after visit summary, provider recommendations, lab and tests results, make an appointment, request refills, and contact your provider or the office for non-urgent questions or concerns. Providers are seeing patients during normal business hours and do not have built in time to review MyChart messages.  We ask  that you allow a minimum of 3 business days for responses to [employer]. For this reason, please do not send urgent requests through MyChart. Please call the office at [phone]. New and ongoing conditions may require a visit. We have virtual and in person visit available for your convenience.  Complex MyChart concerns may require a visit. Your provider may request you schedule a virtual or in person visit to ensure we are providing the best care possible. MyChart messages sent after 11:00 AM on Friday will not be received by the provider until Monday morning.    Lab and Test Results: You will receive your lab and test results on MyChart as soon as they are completed and results have been sent by the lab or testing facility. Due to this service, you will receive your results BEFORE your provider.  I review lab and tests results each morning prior to seeing patients. Some results require collaboration with other providers to ensure you are receiving the most appropriate care. For this reason, we ask that you please allow a minimum of 3-5 business days from the time the ALL results have been received for your provider to receive and review lab and test results and contact you about these.  Most lab and test result comments from the provider will be sent through MyChart. Your provider may recommend changes to the plan of care, follow-up visits, repeat testing, ask questions, or request an office visit to discuss these results. You may reply directly to this message or call the office at [phone] to provide information for the provider  or set up an appointment. In some instances, you will be called with test results and recommendations. Please let us know if this is preferred and we will make note of this in your chart to provide this for you.    If you have not heard a response to your lab or test results in 5 business days from all results returning to MyChart, please call the office to let us  know. We ask that you please avoid calling prior to this time unless there is an emergent concern. Due to high call volumes, this can delay the resulting process.   After Hours: For all non-emergency after hours needs, please call the office at [phone] and select the option to reach the on-call provider service. On-call services are shared between multiple [place] offices and therefore it will not be possible to speak directly with your provider. On-call providers may provide medical advice and recommendations, but are unable to provide refills for maintenance medications.  For all emergency or urgent medical needs after normal business hours, we recommend that you seek care at the closest Urgent Care or Emergency Department to ensure appropriate treatment in a timely manner.  [facility] [place] has a 24 hour emergency room located on the ground floor for your convenience.    Urgent Concerns During the Business Day Providers are seeing patients from 8AM to 5PM, Monday through Thursday, and 8AM to 12PM on Friday with a busy schedule and are most often not able to respond to non-urgent calls until the end of the day or the next business day. If you should have URGENT concerns during the day, please call and speak to the nurse or schedule a same day appointment so that we can address your concern without delay.    Thank you, again, for choosing me as your health care partner. I appreciate your trust and look forward to learning more about you.    Alyson Reedy, FNP-C

## 2023-12-06 ENCOUNTER — Telehealth: Payer: 59 | Admitting: Student in an Organized Health Care Education/Training Program

## 2023-12-08 DIAGNOSIS — F411 Generalized anxiety disorder: Secondary | ICD-10-CM | POA: Diagnosis not present

## 2023-12-08 DIAGNOSIS — F900 Attention-deficit hyperactivity disorder, predominantly inattentive type: Secondary | ICD-10-CM | POA: Diagnosis not present

## 2023-12-08 DIAGNOSIS — F331 Major depressive disorder, recurrent, moderate: Secondary | ICD-10-CM | POA: Diagnosis not present

## 2023-12-10 LAB — CBC WITH DIFFERENTIAL/PLATELET
Basophils Absolute: 0 10*3/uL (ref 0.0–0.2)
Basos: 1 %
EOS (ABSOLUTE): 0.1 10*3/uL (ref 0.0–0.4)
Eos: 2 %
Hematocrit: 45.1 % (ref 34.0–46.6)
Hemoglobin: 15.1 g/dL (ref 11.1–15.9)
Immature Grans (Abs): 0 10*3/uL (ref 0.0–0.1)
Immature Granulocytes: 1 %
Lymphocytes Absolute: 0.8 10*3/uL (ref 0.7–3.1)
Lymphs: 21 %
MCH: 30.7 pg (ref 26.6–33.0)
MCHC: 33.5 g/dL (ref 31.5–35.7)
MCV: 92 fL (ref 79–97)
Monocytes Absolute: 0.3 10*3/uL (ref 0.1–0.9)
Monocytes: 8 %
Neutrophils Absolute: 2.6 10*3/uL (ref 1.4–7.0)
Neutrophils: 67 %
Platelets: 192 10*3/uL (ref 150–450)
RBC: 4.92 x10E6/uL (ref 3.77–5.28)
RDW: 11.9 % (ref 11.7–15.4)
WBC: 3.9 10*3/uL (ref 3.4–10.8)

## 2023-12-10 LAB — ANA 12 PROFILE, POSITIVE ANA
Anti-Cardiolipin Ab, IgA (RDL): <12 [APL'U]/mL (ref <12)
Anti-Cardiolipin Ab, IgG (RDL): <15 [GPL'U]/mL (ref <15)
Anti-Cardiolipin Ab, IgM (RDL): 20 [MPL'U]/mL (ref <13)
Anti-Centromere Ab (RDL): 1:80 {titer} — ABNORMAL HIGH
Anti-La (SS-B) Ab (RDL): <20 U (ref <20)
Anti-Ro (SS-A) Ab (RDL): <20 U (ref <20)
Anti-Scl-70 Ab (RDL): <20 U (ref <20)
Anti-Sm Ab (RDL): <20 U (ref <20)
Anti-TPO Ab (RDL): <9 [IU]/mL (ref <9.0)
Anti-U1 RNP Ab (RDL): <20 U (ref <20)
Anti-dsDNA Ab by Farr(RDL): <8 [IU]/mL (ref <8.0)
C3 Complement (RDL): 117 mg/dL (ref 90–180)
C4 Complement (RDL): 24 mg/dL (ref 14–44)
Centromere Pattern: 1:80 {titer} — ABNORMAL HIGH

## 2023-12-10 LAB — RHEUMATOID FACTOR: Rheumatoid fact SerPl-aCnc: <10 [IU]/mL (ref <14.0)

## 2023-12-10 LAB — LIPID PANEL
Chol/HDL Ratio: 4 {ratio} (ref 0.0–4.4)
Cholesterol, Total: 177 mg/dL (ref 100–199)
HDL: 44 mg/dL (ref >39)
LDL Chol Calc (NIH): 120 mg/dL — ABNORMAL HIGH (ref 0–99)
Triglycerides: 67 mg/dL (ref 0–149)
VLDL Cholesterol Cal: 13 mg/dL (ref 5–40)

## 2023-12-10 LAB — TSH RFX ON ABNORMAL TO FREE T4: TSH: 2.29 u[IU]/mL (ref 0.450–4.500)

## 2023-12-10 LAB — COMPREHENSIVE METABOLIC PANEL
ALT: 29 [IU]/L (ref 0–32)
AST: 23 [IU]/L (ref 0–40)
Albumin: 4.5 g/dL (ref 4.0–5.0)
Alkaline Phosphatase: 74 [IU]/L (ref 44–121)
BUN/Creatinine Ratio: 16 (ref 9–23)
BUN: 11 mg/dL (ref 6–20)
Bilirubin Total: 0.3 mg/dL (ref 0.0–1.2)
CO2: 24 mmol/L (ref 20–29)
Calcium: 9.9 mg/dL (ref 8.7–10.2)
Chloride: 104 mmol/L (ref 96–106)
Creatinine, Ser: 0.7 mg/dL (ref 0.57–1.00)
Globulin, Total: 2.3 g/dL (ref 1.5–4.5)
Glucose: 88 mg/dL (ref 70–99)
Potassium: 4.1 mmol/L (ref 3.5–5.2)
Sodium: 144 mmol/L (ref 134–144)
Total Protein: 6.8 g/dL (ref 6.0–8.5)
eGFR: 120 mL/min/{1.73_m2} (ref >59)

## 2023-12-10 LAB — SEDIMENTATION RATE: Sed Rate: 8 mm/h (ref 0–32)

## 2023-12-10 LAB — ANA PROFILE 12 (RDL): Anti-Nuclear Ab by IFA (RDL): POSITIVE — AB

## 2023-12-10 LAB — HEMOGLOBIN A1C
Est. average glucose Bld gHb Est-mCnc: 94 mg/dL
Hgb A1c MFr Bld: 4.9 % (ref 4.8–5.6)

## 2023-12-11 ENCOUNTER — Encounter: Payer: Self-pay | Admitting: Family Medicine

## 2023-12-13 ENCOUNTER — Ambulatory Visit: Admitting: Family Medicine

## 2023-12-14 ENCOUNTER — Other Ambulatory Visit (INDEPENDENT_AMBULATORY_CARE_PROVIDER_SITE_OTHER): Payer: Self-pay | Admitting: Radiology

## 2023-12-14 ENCOUNTER — Ambulatory Visit: Admitting: Family Medicine

## 2023-12-14 ENCOUNTER — Other Ambulatory Visit: Payer: Self-pay | Admitting: Family Medicine

## 2023-12-14 ENCOUNTER — Encounter: Payer: Self-pay | Admitting: Family Medicine

## 2023-12-14 VITALS — BP 112/78 | HR 96 | Ht 63.0 in | Wt 189.0 lb

## 2023-12-14 DIAGNOSIS — H04129 Dry eye syndrome of unspecified lacrimal gland: Secondary | ICD-10-CM

## 2023-12-14 DIAGNOSIS — R768 Other specified abnormal immunological findings in serum: Secondary | ICD-10-CM

## 2023-12-14 DIAGNOSIS — R682 Dry mouth, unspecified: Secondary | ICD-10-CM

## 2023-12-14 DIAGNOSIS — G5601 Carpal tunnel syndrome, right upper limb: Secondary | ICD-10-CM

## 2023-12-14 DIAGNOSIS — M2550 Pain in unspecified joint: Secondary | ICD-10-CM

## 2023-12-14 MED ORDER — TRIAMCINOLONE ACETONIDE 40 MG/ML IJ SUSP
40.0000 mg | Freq: Once | INTRAMUSCULAR | Status: AC
  Administered 2023-12-14: 40 mg via INTRAMUSCULAR

## 2023-12-14 NOTE — Addendum Note (Signed)
 Addended by: Guido Sander on: 12/14/2023 04:57 PM   Modules accepted: Orders

## 2023-12-14 NOTE — Patient Instructions (Signed)
 Patient Plan  Carpal Tunnel Syndrome (Right):  1. Wear a brace on your wrist day and night for two days after the injection. After that, wear it only at night for ongoing support. 2. Continue taking Diclofenac until you feel pain relief from the injection. 3. Check MyChart for exercises to help improve tendon movement and reduce pressure on the median nerve. 4. Plan for a repeat injection in three months if necessary.  Red Flags: - Contact your healthcare provider if you experience persistent numbness in your fingers or if pain and swelling increase after the injection.

## 2023-12-14 NOTE — Progress Notes (Signed)
     Primary Care / Sports Medicine Office Visit  Patient Information:  Patient ID: [name], female DOB: 1996/02/10 Age: 28 y.o. MRN: [id number]   [name] is a pleasant 28 y.o. female presenting with the following:  Chief Complaint  Patient presents with   Wrist Pain    Patient presents today for her right carpel tunnel symptoms. Numbness and tingling have gotten worse since patients last visit in January. Gripping, grasping and squeezing are worse. Patient feels she is getting weakness and she may drop things.     Vitals:   12/14/23 1540  BP: 112/78  Pulse: 96  SpO2: 99%   Vitals:   12/14/23 1540  Weight: 189 lb (85.7 kg)  Height: 5\' 3"  (1.6 m)   Body mass index is 33.48 kg/m.  No results found.   Independent interpretation of notes and tests performed by another provider:   None  Procedures performed:   Procedure:  Injection of right carpal tunnel under ultrasound guidance. Ultrasound guidance utilized for in-plane approach to the right median nerve perineural region, following injectate, nerve freely mobile Samsung HS60 device utilized with permanent recording / reporting. Verbal informed consent obtained and verified. Skin prepped in a sterile fashion. Ethyl chloride for topical local analgesia.  Completed without difficulty and tolerated with transient vasovagal response noted during the procedure.. Medication: triamcinolone acetonide 40 mg/mL suspension for injection 1 mL total and 2 mL lidocaine 1% without epinephrine utilized for needle placement anesthetic Advised to contact for fevers/chills, erythema, induration, drainage, or persistent bleeding.   Pertinent History, Exam, Impression, and Recommendations:   Problem List Items Addressed This Visit     Carpal tunnel syndrome on right - Primary   History of Present Illness [name] is a 28 year old female with carpal tunnel syndrome who presents for a carpal tunnel  injection.  She is currently using diclofenac, which helps with other musculoskeletal pain, including her spine.  Assessment and Plan Carpal Tunnel Syndrome -right Injection performed today with cortisone and lidocaine. Discussed the potential for numbness in the fingers and the expected timeline for pain relief.  -Use brace day and night for two days post-injection, then switch to nighttime only bracing for maintenance. -Continue Diclofenac until pain relief from injection is noted. -Exercises to be sent via MyChart to improve tendon movement and reduce pressure on the median nerve. -Repeat injection in three months if needed.      Relevant Orders   [place] LIMITED JOINT SPACE STRUCTURES UP RIGHT     Orders & Medications Medications: No orders of the defined types were placed in this encounter.  Orders Placed This Encounter  Procedures   [place] LIMITED JOINT SPACE STRUCTURES UP RIGHT     No follow-ups on file.     Jerrol Banana, MD, [facility]   Primary Care Sports Medicine Primary Care and Sports Medicine at [facility]

## 2023-12-14 NOTE — Assessment & Plan Note (Signed)
 History of Present Illness [name] is a 28 year old female with carpal tunnel syndrome who presents for a carpal tunnel injection.  She is currently using diclofenac, which helps with other musculoskeletal pain, including her spine.  Assessment and Plan Carpal Tunnel Syndrome -right Injection performed today with cortisone and lidocaine. Discussed the potential for numbness in the fingers and the expected timeline for pain relief.  -Use brace day and night for two days post-injection, then switch to nighttime only bracing for maintenance. -Continue Diclofenac until pain relief from injection is noted. -Exercises to be sent via MyChart to improve tendon movement and reduce pressure on the median nerve. -Repeat injection in three months if needed.

## 2023-12-15 ENCOUNTER — Telehealth: Payer: Self-pay

## 2023-12-15 NOTE — Telephone Encounter (Signed)
 Nurse portion of telephone with Dr. Cherylann Ratel on 12/16/23 visit completed.

## 2023-12-16 ENCOUNTER — Ambulatory Visit
Payer: 59 | Attending: Student in an Organized Health Care Education/Training Program | Admitting: Student in an Organized Health Care Education/Training Program

## 2023-12-16 DIAGNOSIS — M5416 Radiculopathy, lumbar region: Secondary | ICD-10-CM

## 2023-12-16 DIAGNOSIS — M5030 Other cervical disc degeneration, unspecified cervical region: Secondary | ICD-10-CM

## 2023-12-16 DIAGNOSIS — G8929 Other chronic pain: Secondary | ICD-10-CM | POA: Diagnosis not present

## 2023-12-16 DIAGNOSIS — M545 Low back pain, unspecified: Secondary | ICD-10-CM

## 2023-12-16 NOTE — Progress Notes (Signed)
 Patient: [name]  Service Category: E/M  Provider: Edward Jolly, MD  DOB: 07-Mar-1996  DOS: 12/16/2023  Location: Office  MRN: [id number]  Setting: Ambulatory outpatient  Referring Provider: Duanne Limerick, MD  Type: Established Patient  Specialty: Interventional Pain Management  PCP: Alyson Reedy, FNP  Location: Remote location  Delivery: TeleHealth     Virtual Encounter - Pain Management PROVIDER NOTE: Information contained herein reflects review and annotations entered in association with encounter. Interpretation of such information and data should be left to medically-trained personnel. Information provided to patient can be located elsewhere in the medical record under "Patient Instructions". Document created using STT-dictation technology, any transcriptional errors that may result from process are unintentional.    Contact & Pharmacy Preferred: [phone] Home: [phone] (home) Mobile: [phone] (mobile) E-mail: [email or web address]  [facility] [phone] Dan Humphre[place], [place] - [address] [place] [address] Phone: [phone] Fax: [phone]   Pre-screening  [name] offered "in-person" vs "virtual" encounter. She indicated preferring virtual for this encounter.   Reason COVID-19*  Social distancing based on CDC and AMA recommendations.   I contacted [name] on 12/16/2023 via telephone.      I clearly identified myself as Edward Jolly, MD. I verified that I was speaking with the correct person using two identifiers (Name: [name], and date of birth: 09/23/1996).  Consent I sought verbal advanced consent from [name] for virtual visit interactions. I informed [name] of possible security and privacy concerns, risks, and limitations associated with providing "not-in-person" medical evaluation and management services. I also informed [name] of the availability of "in-person" appointments. Finally, I informed her that  there would be a charge for the virtual visit and that she could be  personally, fully or partially, financially responsible for it. [name] expressed understanding and agreed to proceed.   Historic Elements   [name] is a 28 y.o. year old, female patient evaluated today after our last contact on 11/10/2023. [name]  has a past medical history of Endometriosis, Gestational diabetes, Otitis media, and Seizures (HCC). She also  has a past surgical history that includes Cesarean section; Cholecystectomy; Tonsillectomy; laparoscopy (2012); and Cesarean section (N/A, 03/04/2018). [name] has a current medication list which includes the following prescription(s): amphetamine-dextroamphetamine, amphetamine-dextroamphetamine, cyclobenzaprine, diclofenac, duloxetine, duloxetine, [DISCONTINUED] promethazine, and [DISCONTINUED] simethicone. She  reports that she has never smoked. She has never been exposed to tobacco smoke. She has never used smokeless tobacco. She reports current alcohol use of about 2.0 standard drinks of alcohol per week. She reports that she does not use drugs. [name] is allergic to hydrocodone, sertraline, shellfish-derived products, aspirin, pollen extract, shellfish allergy, zoloft [sertraline hcl], and mold extract [trichophyton mentagrophyte].  BMI: Estimated body mass index is 33.48 kg/m as calculated from the following:   Height as of 12/14/23: 5\' 3"  (1.6 m).   Weight as of 12/14/23: 189 lb (85.7 kg). Last encounter: 09/07/2023. Last procedure: 11/10/2023.  HPI  Today, she is being contacted for a post-procedure assessment.   Post-procedure evaluation    Type: Lumbar epidural steroid injection (LESI) (interlaminar) #1    Laterality: Midline   Level:  L5-S1 Level.  Imaging: Fluoroscopic guidance         Anesthesia: Local anesthesia (1-2% Lidocaine) DOS: 11/10/2023  Performed by: Edward Jolly, MD  Purpose: Diagnostic/Therapeutic Indications: Lumbar radicular  pain of intraspinal etiology of more than 4 weeks that has failed to respond to  conservative therapy and is severe enough to impact quality of life or function. 1. Chronic radicular lumbar pain   2. Lumbar radiculopathy    NAS-11 Pain score:   Pre-procedure: 6 /10   Post-procedure: 0-No pain/10     Effectiveness:  Initial hour after procedure: 100 %  Subsequent 4-6 hours post-procedure: 100 %  Analgesia past initial 6 hours: 0 % (2 weeks had 75% relief and pain gradually returned above and below injection site)  Ongoing improvement:  Analgesic:  back to baseline Function: Somewhat improved ROM: Somewhat improved    Renal Lab Results  Component Value Date   BUN 11 12/03/2023   CREATININE 0.70 12/03/2023   BCR 16 12/03/2023   GFRAA 148 05/01/2020   GFRNONAA 129 05/01/2020    Hepatic Lab Results  Component Value Date   AST 23 12/03/2023   ALT 29 12/03/2023   ALBUMIN 4.5 12/03/2023   ALKPHOS 74 12/03/2023   LIPASE 143 09/19/2013    Electrolytes Lab Results  Component Value Date   NA 144 12/03/2023   K 4.1 12/03/2023   CL 104 12/03/2023   CALCIUM 9.9 12/03/2023   PHOS 3.7 01/16/2020    Bone No results found for: "VD25OH", "VD125OH2TOT", "VH8469GE9", "BM8413KG4", "25OHVITD1", "25OHVITD2", "25OHVITD3", "TESTOFREE", "TESTOSTERONE"  Inflammation (CRP: Acute Phase) (ESR: Chronic Phase) Lab Results  Component Value Date   ESRSEDRATE 8 12/03/2023         Note: Above Lab results reviewed.  Imaging  [place] LIMITED JOINT SPACE STRUCTURES UP RIGHT Procedure:  Injection of right carpal tunnel under ultrasound guidance. Ultrasound guidance utilized for in-plane approach to the right median  nerve perineural region, following injectate, nerve freely mobile Samsung HS60 device utilized with permanent recording / reporting. Verbal informed consent obtained and verified. Skin prepped in a sterile fashion. Ethyl chloride for topical local analgesia.  Completed without difficulty  and tolerated with transient vasovagal  response noted during the procedure.. Medication: triamcinolone acetonide 40 mg/mL suspension for injection 1 mL  total and 2 mL lidocaine 1% without epinephrine utilized for needle  placement anesthetic Advised to contact for fevers/chills, erythema, induration, drainage, or  persistent bleeding.  Assessment  The primary encounter diagnosis was Chronic radicular lumbar pain. Diagnoses of Lumbar radiculopathy, Chronic bilateral low back pain without sciatica, and Degenerative cervical disc were also pertinent to this visit.  Plan of Care  Continue to monitor symptoms.  Future considerations could include diagnostic lumbar facet medial branch nerve blocks at this patient does have mild facet arthropathy.  She has an upcoming appointment with rheumatology for rheumatoid workup.  Follow-up as needed.  Follow-up plan:   No follow-ups on file.      L5-S1 ESI 11/10/2023    Recent Visits Date Type Provider Dept  11/10/23 Procedure visit Edward Jolly, MD [facility]  Showing recent visits within past 90 days and meeting all other requirements Today's Visits Date Type Provider Dept  12/16/23 Office Visit Edward Jolly, MD [facility]  Showing today's visits and meeting all other requirements Future Appointments No visits were found meeting these conditions. Showing future appointments within next 90 days and meeting all other requirements  I discussed the assessment and treatment plan with the patient. The patient was provided an opportunity to ask questions and all were answered. The patient agreed with the plan and demonstrated an understanding of the instructions.  Patient advised to call back or seek an in-person evaluation if the symptoms or condition worsens.  Duration of encounter: .  Note by: Edward Jolly, MD Date: 12/16/2023; Time: 3:13 PM

## 2023-12-20 ENCOUNTER — Other Ambulatory Visit (HOSPITAL_COMMUNITY): Payer: Self-pay

## 2023-12-20 DIAGNOSIS — F5105 Insomnia due to other mental disorder: Secondary | ICD-10-CM | POA: Diagnosis not present

## 2023-12-20 DIAGNOSIS — F330 Major depressive disorder, recurrent, mild: Secondary | ICD-10-CM | POA: Diagnosis not present

## 2023-12-20 DIAGNOSIS — D519 Vitamin B12 deficiency anemia, unspecified: Secondary | ICD-10-CM | POA: Diagnosis not present

## 2023-12-20 DIAGNOSIS — F411 Generalized anxiety disorder: Secondary | ICD-10-CM | POA: Diagnosis not present

## 2023-12-20 DIAGNOSIS — F908 Attention-deficit hyperactivity disorder, other type: Secondary | ICD-10-CM | POA: Diagnosis not present

## 2023-12-20 MED ORDER — AMPHETAMINE-DEXTROAMPHET ER 30 MG PO CP24
30.0000 mg | ORAL_CAPSULE | Freq: Every day | ORAL | 0 refills | Status: DC
  Filled 2023-12-20: qty 90, 90d supply, fill #0
  Filled 2024-03-01: qty 30, 30d supply, fill #0

## 2023-12-20 MED ORDER — DULOXETINE HCL 60 MG PO CPEP
60.0000 mg | ORAL_CAPSULE | Freq: Every day | ORAL | 0 refills | Status: DC
  Filled 2023-12-20 – 2024-03-01 (×2): qty 90, 90d supply, fill #0

## 2023-12-20 MED ORDER — AMPHETAMINE-DEXTROAMPHETAMINE 10 MG PO TABS
10.0000 mg | ORAL_TABLET | Freq: Every day | ORAL | 0 refills | Status: DC
  Filled 2023-12-22: qty 90, 90d supply, fill #0

## 2023-12-20 MED ORDER — DULOXETINE HCL 30 MG PO CPEP
30.0000 mg | ORAL_CAPSULE | Freq: Every day | ORAL | 0 refills | Status: DC
  Filled 2023-12-20 – 2024-03-01 (×2): qty 90, 90d supply, fill #0

## 2023-12-21 DIAGNOSIS — F411 Generalized anxiety disorder: Secondary | ICD-10-CM | POA: Diagnosis not present

## 2023-12-21 DIAGNOSIS — F331 Major depressive disorder, recurrent, moderate: Secondary | ICD-10-CM | POA: Diagnosis not present

## 2023-12-21 DIAGNOSIS — F900 Attention-deficit hyperactivity disorder, predominantly inattentive type: Secondary | ICD-10-CM | POA: Diagnosis not present

## 2023-12-22 ENCOUNTER — Other Ambulatory Visit (HOSPITAL_COMMUNITY): Payer: Self-pay

## 2024-01-06 DIAGNOSIS — F411 Generalized anxiety disorder: Secondary | ICD-10-CM | POA: Diagnosis not present

## 2024-01-06 DIAGNOSIS — F900 Attention-deficit hyperactivity disorder, predominantly inattentive type: Secondary | ICD-10-CM | POA: Diagnosis not present

## 2024-01-06 DIAGNOSIS — F331 Major depressive disorder, recurrent, moderate: Secondary | ICD-10-CM | POA: Diagnosis not present

## 2024-01-14 ENCOUNTER — Ambulatory Visit: Payer: 59 | Admitting: Family Medicine

## 2024-01-18 ENCOUNTER — Ambulatory Visit (INDEPENDENT_AMBULATORY_CARE_PROVIDER_SITE_OTHER): Admitting: Family Medicine

## 2024-01-18 ENCOUNTER — Ambulatory Visit: Payer: 59 | Admitting: Family Medicine

## 2024-01-18 ENCOUNTER — Telehealth: Payer: Self-pay

## 2024-01-18 VITALS — BP 108/72 | HR 71 | Temp 98.1°F | Ht 64.0 in | Wt 190.2 lb

## 2024-01-18 DIAGNOSIS — F900 Attention-deficit hyperactivity disorder, predominantly inattentive type: Secondary | ICD-10-CM | POA: Diagnosis not present

## 2024-01-18 DIAGNOSIS — F411 Generalized anxiety disorder: Secondary | ICD-10-CM | POA: Diagnosis not present

## 2024-01-18 DIAGNOSIS — F32A Depression, unspecified: Secondary | ICD-10-CM | POA: Diagnosis not present

## 2024-01-18 DIAGNOSIS — F331 Major depressive disorder, recurrent, moderate: Secondary | ICD-10-CM | POA: Diagnosis not present

## 2024-01-18 DIAGNOSIS — E538 Deficiency of other specified B group vitamins: Secondary | ICD-10-CM | POA: Diagnosis not present

## 2024-01-18 DIAGNOSIS — R569 Unspecified convulsions: Secondary | ICD-10-CM | POA: Diagnosis not present

## 2024-01-18 NOTE — Telephone Encounter (Signed)
 Left VM requesting a return call in order to reschedule appointment. Provider out of office.

## 2024-01-18 NOTE — Patient Instructions (Addendum)
 MyChart:  For all urgent or time sensitive needs we ask that you please call the office to avoid delays. Our number is [phone]. MyChart is not constantly monitored and due to the large volume of messages a day, replies may take up to 72 business hours.   MyChart Policy: MyChart allows for you to see your visit notes, after visit summary, provider recommendations, lab and tests results, make an appointment, request refills, and contact your provider or the office for non-urgent questions or concerns. Providers are seeing patients during normal business hours and do not have built in time to review MyChart messages.  We ask that you allow a minimum of 3 business days for responses to [employer]. For this reason, please do not send urgent requests through MyChart. Please call the office at [phone]. New and ongoing conditions may require a visit. We have virtual and in person visit available for your convenience.  Complex MyChart concerns may require a visit. Your provider may request you schedule a virtual or in person visit to ensure we are providing the best care possible. MyChart messages sent after 11:00 AM on Friday will not be received by the provider until Monday morning.    Lab and Test Results: You will receive your lab and test results on MyChart as soon as they are completed and results have been sent by the lab or testing facility. Due to this service, you will receive your results BEFORE your provider.  I review lab and tests results each morning prior to seeing patients. Some results require collaboration with other providers to ensure you are receiving the most appropriate care. For this reason, we ask that you please allow a minimum of 3-5 business days from the time the ALL results have been received for your provider to receive and review lab and test results and contact you about these.  Most lab and test result comments from the provider will be sent through MyChart.  Your provider may recommend changes to the plan of care, follow-up visits, repeat testing, ask questions, or request an office visit to discuss these results. You may reply directly to this message or call the office at [phone] to provide information for the provider or set up an appointment. In some instances, you will be called with test results and recommendations. Please let us know if this is preferred and we will make note of this in your chart to provide this for you.    If you have not heard a response to your lab or test results in 5 business days from all results returning to MyChart, please call the office to let us know. We ask that you please avoid calling prior to this time unless there is an emergent concern. Due to high call volumes, this can delay the resulting process.   After Hours: For all non-emergency after hours needs, please call the office at [phone] and select the option to reach the on-call provider service. On-call services are shared between multiple [place] offices and therefore it will not be possible to speak directly with your provider. On-call providers may provide medical advice and recommendations, but are unable to provide refills for maintenance medications.  For all emergency or urgent medical needs after normal business hours, we recommend that you seek care at the closest Urgent Care or Emergency Department to ensure appropriate treatment in a timely manner.  [facility] has a 24 hour emergency room located on the ground floor for your  convenience.    Urgent Concerns During the Business Day Providers are seeing patients from 8AM to 5PM, Monday through Thursday, and 8AM to 12PM on Friday with a busy schedule and are most often not able to respond to non-urgent calls until the end of the day or the next business day. If you should have URGENT concerns during the day, please call and speak to the nurse or schedule a same day  appointment so that we can address your concern without delay.    Thank you, again, for choosing me as your health care partner. I appreciate your trust and look forward to learning more about you.    Alyson Reedy, FNP-C MyChart:  For all urgent or time sensitive needs we ask that you please call the office to avoid delays. Our number is [phone]. MyChart is not constantly monitored and due to the large volume of messages a day, replies may take up to 72 business hours.   MyChart Policy: MyChart allows for you to see your visit notes, after visit summary, provider recommendations, lab and tests results, make an appointment, request refills, and contact your provider or the office for non-urgent questions or concerns. Providers are seeing patients during normal business hours and do not have built in time to review MyChart messages.  We ask that you allow a minimum of 3 business days for responses to [employer]. For this reason, please do not send urgent requests through MyChart. Please call the office at [phone]. New and ongoing conditions may require a visit. We have virtual and in person visit available for your convenience.  Complex MyChart concerns may require a visit. Your provider may request you schedule a virtual or in person visit to ensure we are providing the best care possible. MyChart messages sent after 11:00 AM on Friday will not be received by the provider until Monday morning.    Lab and Test Results: You will receive your lab and test results on MyChart as soon as they are completed and results have been sent by the lab or testing facility. Due to this service, you will receive your results BEFORE your provider.  I review lab and tests results each morning prior to seeing patients. Some results require collaboration with other providers to ensure you are receiving the most appropriate care. For this reason, we ask that you please allow a minimum of 3-5 business  days from the time the ALL results have been received for your provider to receive and review lab and test results and contact you about these.  Most lab and test result comments from the provider will be sent through MyChart. Your provider may recommend changes to the plan of care, follow-up visits, repeat testing, ask questions, or request an office visit to discuss these results. You may reply directly to this message or call the office at [phone] to provide information for the provider or set up an appointment. In some instances, you will be called with test results and recommendations. Please let us know if this is preferred and we will make note of this in your chart to provide this for you.    If you have not heard a response to your lab or test results in 5 business days from all results returning to MyChart, please call the office to let us know. We ask that you please avoid calling prior to this time unless there is an emergent concern. Due to high call volumes, this can delay the resulting process.  After Hours: For all non-emergency after hours needs, please call the office at [phone] and select the option to reach the on-call provider service. On-call services are shared between multiple [facility] offices and therefore it will not be possible to speak directly with your provider. On-call providers may provide medical advice and recommendations, but are unable to provide refills for maintenance medications.  For all emergency or urgent medical needs after normal business hours, we recommend that you seek care at the closest Urgent Care or Emergency Department to ensure appropriate treatment in a timely manner.  [facility] has a 24 hour emergency room located on the ground floor for your convenience.    Urgent Concerns During the Business Day Providers are seeing patients from 8AM to 5PM, Monday through Thursday, and 8AM to 12PM on Friday with a busy  schedule and are most often not able to respond to non-urgent calls until the end of the day or the next business day. If you should have URGENT concerns during the day, please call and speak to the nurse or schedule a same day appointment so that we can address your concern without delay.    Thank you, again, for choosing me as your health care partner. I appreciate your trust and look forward to learning more about you.    Alyson Reedy, FNP-C

## 2024-01-18 NOTE — Progress Notes (Signed)
 Established Patient Office Visit  Subjective  Patient ID: [name], female    DOB: 1995/11/09  Age: 28 y.o. MRN: [id number]  Chief Complaint  Patient presents with   Follow-up    Pt. Here for a follow-up visit.    [name] is a 28 year old female patient who presents for medical management of chronic conditions.   Went to sports medicine on 3/4 and received injection in R wrist  She reports at this visit, she had a seizure-like episode   Recently saw pain management on 3/6 for her chronic radicular lumbar pain  Injection worked for about a month or two, but worsened pain above and below. She reports this specialist wanted to wait for rheumatology's work-up.  Her appt is schedule for August.   Patient discussed with her psychologist about recent seizure-like activity on 3/4, and wanted her to discuss any contraindications for her being on Cymbalta (60mg  in AM and 30mg  in PM). She reports as a child, she had seizure-like activity after receiving certain injections/prcedures. Told it was possible vasovagal in nature. Went to neurology without any diagnoses. However, she reports no complications with her pregnancies (even during her epidural). Reports she will feel lightheadedness/dizziness prior to blacking out, convulsions, forgetfulness afterwards.  Denies headache, vision changes, chest pain/pressure, diaphoresis, heart palpitations, tachycardia.   ROS: see HPI     01/18/2024   10:51 AM 12/03/2023    8:41 AM 10/26/2023    4:27 PM 08/16/2023   10:46 AM  GAD 7 : Generalized Anxiety Score  Nervous, Anxious, on Edge 2 2 2 2   Control/stop worrying 2 1 1 2   Worry too much - different things 1 2 1 2   Trouble relaxing 2 1 1 2   Restless 1 0 0 2  Easily annoyed or irritable 2 1 1 2   Afraid - awful might happen 0 0 0 1  Total GAD 7 Score 10 7 6 13   Anxiety Difficulty Somewhat difficult Somewhat difficult Somewhat difficult Somewhat difficult      01/18/2024   10:51 AM  12/03/2023    8:41 AM 11/10/2023    1:22 PM  PHQ9 SCORE ONLY  PHQ-9 Total Score 6 6 3        Objective:    BP 108/72   Pulse 71   Temp 98.1 F (36.7 C) (Oral)   Ht 5\' 4"  (1.626 m)   Wt 190 lb 3.2 oz (86.3 kg)   SpO2 98%   BMI 32.65 kg/m  BP Readings from Last 3 Encounters:  01/18/24 108/72  12/14/23 112/78  12/03/23 130/80     Physical Exam Constitutional:      Appearance: Normal appearance.  Cardiovascular:     Rate and Rhythm: Normal rate and regular rhythm.     Pulses: Normal pulses.     Heart sounds: Normal heart sounds.  Pulmonary:     Effort: Pulmonary effort is normal.     Breath sounds: Normal breath sounds.  Neurological:     Mental Status: She is alert.  Psychiatric:        Mood and Affect: Mood normal.        Behavior: Behavior normal.     Assessment & Plan:   1. Seizure-like activity (HCC) (Primary) Patient reports that she has experienced seizure-like activity as a child, after receiving certain injections/prcedures. In her childhood, she was informed it was most likely vasovagal syncope. With no clear etiology from neurology. She reports experiencing no episodes during either of her two pregnancies.  Reports during these episodes, she will feel lightheadedness/dizziness prior to blacking out, convulsions, and in a postictal state. Advised patient warnings with Cymbalta in patients with a past history of seizures. Patient would prefer to stay on current medication regimen and will reach out if these episodes increase in frequency- it may then be reasonable to adjust her mood medication. Patient verbalized understanding and will keep me updated.   2. Chronic depression PHQ9 completed with score of 6. Currently managed/followed by psychiatry- will request records today.   3. GAD (generalized anxiety disorder) GAD7 completed with score of 10. She is currently followed by Dr. Maryruth Bun with psychiatry- obtaining records. Discussed continuing with Cymbalta for now-  no contraindication for medication management. Advised patient to notify office if she develops any new, or more frequent, seizure-like activity.   4. Vitamin B12 deficiency History of vitamin B12 deficiency. Will obtain vitamin B12 levels and update patient with results.  - Vitamin B12  Return in about 6 months (around 07/19/2024) for Physical with fasting labs.    Alyson Reedy, FNP

## 2024-01-19 LAB — VITAMIN B12: Vitamin B-12: 675 pg/mL (ref 232–1245)

## 2024-01-21 ENCOUNTER — Encounter: Payer: Self-pay | Admitting: Family Medicine

## 2024-02-01 ENCOUNTER — Ambulatory Visit: Attending: Rheumatology | Admitting: Rheumatology

## 2024-02-01 ENCOUNTER — Ambulatory Visit

## 2024-02-01 ENCOUNTER — Ambulatory Visit (INDEPENDENT_AMBULATORY_CARE_PROVIDER_SITE_OTHER)

## 2024-02-01 ENCOUNTER — Encounter: Payer: Self-pay | Admitting: Rheumatology

## 2024-02-01 VITALS — BP 131/84 | HR 78 | Resp 13 | Ht 63.5 in | Wt 189.4 lb

## 2024-02-01 DIAGNOSIS — M47814 Spondylosis without myelopathy or radiculopathy, thoracic region: Secondary | ICD-10-CM

## 2024-02-01 DIAGNOSIS — M791 Myalgia, unspecified site: Secondary | ICD-10-CM | POA: Diagnosis not present

## 2024-02-01 DIAGNOSIS — M2550 Pain in unspecified joint: Secondary | ICD-10-CM

## 2024-02-01 DIAGNOSIS — R768 Other specified abnormal immunological findings in serum: Secondary | ICD-10-CM | POA: Diagnosis not present

## 2024-02-01 DIAGNOSIS — M5030 Other cervical disc degeneration, unspecified cervical region: Secondary | ICD-10-CM | POA: Diagnosis not present

## 2024-02-01 DIAGNOSIS — M545 Low back pain, unspecified: Secondary | ICD-10-CM

## 2024-02-01 DIAGNOSIS — R569 Unspecified convulsions: Secondary | ICD-10-CM | POA: Diagnosis not present

## 2024-02-01 DIAGNOSIS — Z8489 Family history of other specified conditions: Secondary | ICD-10-CM

## 2024-02-01 DIAGNOSIS — M25561 Pain in right knee: Secondary | ICD-10-CM

## 2024-02-01 DIAGNOSIS — E559 Vitamin D deficiency, unspecified: Secondary | ICD-10-CM | POA: Diagnosis not present

## 2024-02-01 DIAGNOSIS — G8929 Other chronic pain: Secondary | ICD-10-CM | POA: Diagnosis not present

## 2024-02-01 DIAGNOSIS — G5601 Carpal tunnel syndrome, right upper limb: Secondary | ICD-10-CM | POA: Diagnosis not present

## 2024-02-01 DIAGNOSIS — M5481 Occipital neuralgia: Secondary | ICD-10-CM | POA: Diagnosis not present

## 2024-02-01 DIAGNOSIS — F32A Depression, unspecified: Secondary | ICD-10-CM

## 2024-02-01 DIAGNOSIS — R102 Pelvic and perineal pain: Secondary | ICD-10-CM

## 2024-02-01 DIAGNOSIS — A6004 Herpesviral vulvovaginitis: Secondary | ICD-10-CM

## 2024-02-01 DIAGNOSIS — N809 Endometriosis, unspecified: Secondary | ICD-10-CM

## 2024-02-01 DIAGNOSIS — M25562 Pain in left knee: Secondary | ICD-10-CM

## 2024-02-01 DIAGNOSIS — Z8269 Family history of other diseases of the musculoskeletal system and connective tissue: Secondary | ICD-10-CM

## 2024-02-01 DIAGNOSIS — F419 Anxiety disorder, unspecified: Secondary | ICD-10-CM

## 2024-02-01 NOTE — Progress Notes (Signed)
 Office Visit Note  Patient: [name]             Date of Birth: January 31, 1996           MRN: [id number]             PCP: Wilhelmena Hanson, FNP Referring: Wilhelmena Hanson, FNP Visit Date: 02/01/2024 Occupation: @GUAROCC @  Subjective:  Pain in multiple joints  History of Present Illness: [name] is a 28 y.o. female seen in consultation for the evaluation of positive ANA.  According the patient in 2023 she started experiencing pain in her knee joints.  She states she has had several knee injuries as a child playing sports.  She states she gets intermittent discomfort in her knee joints with the weather change.  In 2023 she darted having right shoulder joint pain and was initially thought it was related to softball injury.  She states later her sports medicine doctor diagnosed her with occipital neuralgia and the pain was associated with the neck pain.  She was treated with muscle relaxers and heat and symptoms improved.  She states when she came off muscle relaxers that her neck pain got worse and she started having migraines and also pain radiating to the thoracic spine.  She states the MRI of the cervical spine showed degenerative changes.  She did some exercises at home and symptoms improved to some extent.  In October 2024 she started having thoracic pain.  She states she had epidural injection in the thoracic spine which helped but she started having increased pain in her neck and lower back.  She was initially tried on Lyrica  which did not help then she was switched to Cymbalta  30 mg a day about 6 months ago which did not help much either.  She states in March 2025 she started having pain in her right hand going all the way to her right shoulder joint.  She was diagnosed with right carpal tunnel syndrome by her sports medicine doctor and had a cortisone injection which helped the symptoms.  She states she has had bradycardia and seizure-like activity/vasovagal effect with both  cortisone injections.  She states the cortisone injections were very painful.  She has not seen a neurologist.  She complains of ongoing discomfort in her neck, lower back, upper back, right carpal tunnel, knee joints, ankles and her feet.  She states she notices swelling in her knees ankles and her feet.  She recently had labs done by her PCP which showed positive ANA for that reason she was referred to be.  She gives history of dry mouth, dry eyes, oral ulcers, joint pain, myalgias, Raynaud's and photosensitivity.  There is family history of systemic lupus, Sjogren's and rheumatoid arthritis in her mother.  Her mother was treated with Plaquenil and methotrexate in the past.  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  She plays softball and goes to the gym 3 times a week.  She enjoys aerobics and weights.  She is married.  She has IUD for contraception.  She is gravida 4, para 2, miscarriage 1, abortion 1.  There is no history of preeclampsia or DVTs.  She drinks alcohol occasionally and is a non-smoker.    Activities of Daily Living:  Patient reports morning stiffness for 0 minutes.   Patient Reports nocturnal pain.  Difficulty dressing/grooming: Denies Difficulty climbing stairs: Denies Difficulty getting out of chair: Denies Difficulty using hands for taps, buttons, cutlery,  and/or writing: Denies  Review of Systems  Constitutional:  Positive for fatigue.  HENT:  Positive for mouth sores and mouth dryness. Negative for nose dryness.   Eyes:  Positive for dryness. Negative for photophobia, pain and redness.  Respiratory:  Negative for shortness of breath.   Cardiovascular:  Negative for chest pain and palpitations.  Gastrointestinal:  Negative for blood in stool, constipation and diarrhea.  Endocrine: Negative for increased urination.  Genitourinary:  Negative for involuntary urination.  Musculoskeletal:  Positive for joint pain, joint pain, joint swelling,  myalgias, muscle tenderness and myalgias. Negative for gait problem, muscle weakness and morning stiffness.  Skin:  Positive for color change and sensitivity to sunlight. Negative for rash and hair loss.  Allergic/Immunologic: Negative for susceptible to infections.  Neurological:  Positive for dizziness and headaches.  Hematological:  Negative for swollen glands.  Psychiatric/Behavioral:  Positive for depressed mood and sleep disturbance. The patient is nervous/anxious.     PMFS History:  Patient Active Problem List   Diagnosis Date Noted   Lumbar radiculopathy 12/16/2023   Carpal tunnel syndrome on right 10/26/2023   Thoracic spondylosis 08/02/2023   Bilateral occipital neuralgia 03/18/2023   Chronic bilateral low back pain without sciatica 03/18/2023   Degenerative cervical disc 03/18/2023   Pain in right ankle and joints of right foot 01/18/2023   Pain in right foot 01/18/2023   Sprain of right ankle 01/18/2023   Sprain of right foot 01/18/2023   Disorder of rotator cuff, right 01/11/2023   Right cervical radiculopathy 01/11/2023   Anxiety 05/12/2021   Chronic depression 05/07/2021   Endometriosis, unspecified 05/07/2021   Epilepsy (HCC) 05/07/2021   Family history of neoplasm of breast 05/07/2021   Genital herpes simplex 05/07/2021   Family history of neoplasm of ovary 05/07/2021   History of thrombocytopenia 05/07/2021   Closed fracture of patella 10/13/2018   Cesarean delivery delivered - Repeat 5/24 03/04/2018   Postpartum care following vaginal delivery 03/04/2018   Seizure-like activity (HCC) 11/30/2017   Diet controlled gestational diabetes mellitus (GDM) in second trimester 10/28/2017   Impaired glucose tolerance test 09/23/2017   Chronic pelvic pain in female 02/01/2012    Past Medical History:  Diagnosis Date   Endometriosis    Gestational diabetes    diet controlled   Otitis media    Seizures (HCC)     Family History  Problem Relation Age of Onset    Diabetes Mother    Lupus Mother        diagnosed age 84   Sjogren's syndrome Mother        diagnosed age 32   Cirrhosis Mother        stage 1   Uterine cancer Mother    Ovarian cancer Mother    Other Mother        breast mass   Rheum arthritis Mother        diagnosed age 46   Breast cancer Mother    Healthy Father    ADD / ADHD Brother    Autism Brother        borderline   Healthy Brother    Healthy Brother    Healthy Son    Healthy Son    Past Surgical History:  Procedure Laterality Date   CESAREAN SECTION  2014   CESAREAN SECTION N/A 03/04/2018   Procedure: Repeat CESAREAN SECTION;  Surgeon: Zora Hires, MD;  Location: [facility];  Service: Obstetrics;  Laterality: N/A;  EDD: 03/10/18 Allergy: Aspirin,  Shellfish   CHOLECYSTECTOMY     LAPAROSCOPY  2012   diagnose endometriosis   TONSILLECTOMY     Social History   Social History Narrative   Lives at home with husband and son. She is currently expecting her second child.   Right-handed.   No caffeine use.   Immunization History  Administered Date(s) Administered   DTaP 01/07/1995, 03/02/1995, 04/27/1995, 06/05/1996, 03/24/2000   Fluzone Influenza virus vaccine,trivalent (IIV3), split virus 12/10/2016   HIB (PRP-OMP) 01/07/1995, 03/02/1995, 04/27/1995, 06/05/1996   HPV Quadrivalent 10/18/2006, 12/15/2006, 07/19/2007   Hepatitis A 10/18/2006, 07/19/2007   Hepatitis B 10/13/1994, 01/07/1995, 05/13/1995   IPV 01/07/1995, 03/02/1995, 04/27/1995, 03/24/2000   Influenza Nasal 07/19/2007, 08/26/2012   Influenza Split 06/25/2010, 07/28/2011   Influenza-Unspecified 07/01/2017, 06/20/2018, 07/11/2019, 07/03/2020, 07/21/2021, 07/27/2022   MMR 06/05/1996, 03/24/2000   Meningococcal Conjugate 10/18/2006, 08/21/2011   PFIZER(Purple Top)SARS-COV-2 Vaccination 11/03/2019, 11/21/2019, 07/26/2020   PPD Test 07/16/2020, 07/30/2020   Tdap 10/18/2006, 07/03/2013, 12/16/2017     Objective: Vital Signs: BP 131/84 (BP  Location: Right Arm, Patient Position: Sitting, Cuff Size: Normal)   Pulse 78   Resp 13   Ht 5' 3.5" (1.613 m)   Wt 189 lb 6.4 oz (85.9 kg)   BMI 33.02 kg/m    Physical Exam Vitals and nursing note reviewed.  Constitutional:      Appearance: She is well-developed.  HENT:     Head: Normocephalic and atraumatic.  Eyes:     Conjunctiva/sclera: Conjunctivae normal.  Cardiovascular:     Rate and Rhythm: Normal rate and regular rhythm.     Heart sounds: Normal heart sounds.  Pulmonary:     Effort: Pulmonary effort is normal.     Breath sounds: Normal breath sounds.  Abdominal:     General: Bowel sounds are normal.     Palpations: Abdomen is soft.  Musculoskeletal:     Cervical back: Normal range of motion.  Lymphadenopathy:     Cervical: No cervical adenopathy.  Skin:    General: Skin is warm and dry.     Capillary Refill: Capillary refill takes less than 2 seconds.  Neurological:     Mental Status: She is alert and oriented to person, place, and time.  Psychiatric:        Behavior: Behavior normal.      Musculoskeletal Exam: Cervical, thoracic and lumbar spine were in good range of motion with some discomfort.  She had no tenderness over SI joints.  Shoulder joints, elbow joints, wrist joints, MCPs PIPs and DIPs with good range of motion with no synovitis.  Hip joints and knee joints in good range of motion without any warmth swelling or effusion.  There was no tenderness over ankles or MTPs.  There was no plantar fasciitis or Achilles tendinitis.  CDAI Exam: CDAI Score: -- Patient Global: --; Provider Global: -- Swollen: --; Tender: -- Joint Exam 02/01/2024   No joint exam has been documented for this visit   There is currently no information documented on the homunculus. Go to the Rheumatology activity and complete the homunculus joint exam.  Investigation: No additional findings.  Imaging: XR KNEE 3 VIEW LEFT Result Date: 02/01/2024 No significant medial or  lateral compartment narrowing was noted.  No patellofemoral narrowing was noted.  Spurring of the patella was noted.  No chondrocalcinosis was noted. Impression: Unremarkable x-rays of the knee.  XR KNEE 3 VIEW RIGHT Result Date: 02/01/2024 No significant medial or lateral compartment narrowing was noted.  No patellofemoral narrowing was  noted.  No chondrocalcinosis was noted. Impression: Unremarkable x-rays of the knee.   Recent Labs: Lab Results  Component Value Date   WBC 3.9 12/03/2023   HGB 15.1 12/03/2023   PLT 192 12/03/2023   NA 144 12/03/2023   K 4.1 12/03/2023   CL 104 12/03/2023   CO2 24 12/03/2023   GLUCOSE 88 12/03/2023   BUN 11 12/03/2023   CREATININE 0.70 12/03/2023   BILITOT 0.3 12/03/2023   ALKPHOS 74 12/03/2023   AST 23 12/03/2023   ALT 29 12/03/2023   PROT 6.8 12/03/2023   ALBUMIN 4.5 12/03/2023   CALCIUM 9.9 12/03/2023   GFRAA 148 05/01/2020   QFTBGOLDPLUS Negative 07/28/2021   August 16, 2023 ANA negative, rheumatoid factor negative, sed rate 9 December 03, 2023 ANA 1: 80 centromere, ENA (dsDNA, Felipe Horton, Jo 1 RNP, SSA, SSB, SCL 70) negative, anticardiolipin negative, complements normal, TPO negative, LDL 120, hemoglobin A1c 4.9, sed rate 8, RF negative January 18, 2024 B12 [id number]  Speciality Comments: No specialty comments available.  Procedures:  No procedures performed Allergies: Hydrocodone , Sertraline, Shellfish-derived products, Aspirin, Pollen extract, and Mold extract [trichophyton mentagrophyte]   Assessment / Plan:     Visit Diagnoses: Polyarthralgia -patient complains of pain and discomfort in her cervical, thoracic and lumbar spine.  She also complains of discomfort in her right wrist due to carpal tunnel syndrome, ankles, knees and her feet.  She gives history of intermittent swelling in her knees and her ankles.  She states she played softball all her life which has caused multiple injuries to her knee joints.  No synovitis was noted on the  examination today.  Rheumatoid factor was negative in the past and sed rate was normal.  Plan: Cyclic citrul peptide antibody, IgG  Positive ANA (antinuclear antibody) -our ANA is low titer centromere pattern which can be associated with limited systemic sclerosis.  She has no clinical features of limited systemic sclerosis.  She gives questionable history of Raynaud's.  No nailbed capillary changes or decreased capillary refill was noted.  No sclerodactyly was noted.  I will repeat ANA today.  She also has history of miscarriage.  I will check beta-2  GP 1 antibodies.  ENA panel was negative.  Plan: ANA, Beta-2  glycoprotein antibodies, C3 and C4, Protein / creatinine ratio, urine  Degenerative cervical disc-she has had chronic discomfort in her cervical spine.  I reviewed x-rays and MRI of the cervical spine which showed mild multilevel disc desiccation in April 2024.  Thoracic spondylosis-she has chronic discomfort in the thoracic spine.  X-rays and MRIs were reviewed.  Thoracic spine MRI showed thoracic spondylosis with multilevel disc protrusion.  Mild dextroscoliosis was also noted.  I believe that is the most likely explanation of her chronic thoracic pain.  Patient has tried Lyrica  without much relief.  She is currently on Cymbalta  and not getting much relief.  She is also had epidural injections in the thoracic region.  Although she could not tolerate the side effects of cortisone injection.  She believes that she had vasovagal/seizure-like activity after the cortisone injection.  Chronic bilateral low back pain without sciatica-she had MRI of the lumbar spine in October 2024 which showed lumbar spondylosis most notable at L5-S1 with a tiny central disc protrusion and facet joint arthropathy.  Slight dextrocurvature scoliosis was also noted.  She has chronic discomfort in the lumbar spine.  She had no SI joint tenderness.  Carpal tunnel syndrome on right-she has been having pain and discomfort in  her right  wrist.  She states she had good response to right carpal tunnel syndrome injection although she again had vasovagal response to the cortisone injection.  Chronic pain of both knees -she has pain and discomfort in the bilateral knee joints.  She states she had several injuries playing softball all her life.  No warmth swelling or effusion was noted today.  Plan: XR KNEE 3 VIEW RIGHT, XR KNEE 3 VIEW LEFT.  X-rays of bilateral knee joints were unremarkable.  Spurring of the left patella was noted.  Myalgia - Plan: CK  Vitamin D  deficiency - Plan: VITAMIN D  25 Hydroxy (Vit-D Deficiency, Fractures)  Other medical problems are listed as follows:  Chronic pelvic pain in female  Bilateral occipital neuralgia  Seizure-like activity (HCC)  Anxiety  Chronic depression  Endometriosis  Herpes simplex vulvovaginitis-only 1 episode with no recurrence per patient.  Family history of neoplasm of breast-mother   Family history of systemic lupus in mother-according the patient her mother has lupus, Sjogren's and rheumatoid arthritis.  She was treated with Plaquenil and methotrexate in the past.  Orders: Orders Placed This Encounter  Procedures   XR KNEE 3 VIEW RIGHT   XR KNEE 3 VIEW LEFT   CK   VITAMIN D  25 Hydroxy (Vit-D Deficiency, Fractures)   ANA   Beta-2 glycoprotein antibodies   C3 and C4   Protein / creatinine ratio, urine   Cyclic citrul peptide antibody, IgG   No orders of the defined types were placed in this encounter.    Follow-Up Instructions: Return for Positive ANA, polyarthralgia.   Nicholas Bari, MD  Note - This record has been created using Animal nutritionist.  Chart creation errors have been sought, but may not always  have been located. Such creation errors do not reflect on  the standard of medical care.

## 2024-02-04 NOTE — Progress Notes (Signed)
 Protein creatinine ratio normal, CK normal, vitamin D  normal, beta-2 GP 1 negative, C3-C4 normal, ANA pending, anti-CCP pending.

## 2024-02-05 LAB — BETA-2 GLYCOPROTEIN ANTIBODIES
Beta-2 Glyco 1 IgA: <2 U/mL (ref <20.0)
Beta-2 Glyco 1 IgM: <2 U/mL (ref <20.0)
Beta-2 Glyco I IgG: <2 U/mL (ref <20.0)

## 2024-02-05 LAB — C3 AND C4
C3 Complement: 113 mg/dL (ref 83–193)
C4 Complement: 23 mg/dL (ref 15–57)

## 2024-02-05 LAB — PROTEIN / CREATININE RATIO, URINE
Creatinine, Urine: 42 mg/dL (ref 20–275)
Total Protein, Urine: <4 mg/dL — ABNORMAL LOW (ref 5–24)

## 2024-02-05 LAB — ANTI-NUCLEAR AB-TITER (ANA TITER)
ANA TITER: 1:80 {titer} — ABNORMAL HIGH
ANA Titer 1: 1:160 {titer} — ABNORMAL HIGH

## 2024-02-05 LAB — ANA: Anti Nuclear Antibody (ANA): POSITIVE — AB

## 2024-02-05 LAB — VITAMIN D 25 HYDROXY (VIT D DEFICIENCY, FRACTURES): Vit D, 25-Hydroxy: 41 ng/mL (ref 30–100)

## 2024-02-05 LAB — CK: Total CK: 75 U/L (ref 20–239)

## 2024-02-05 LAB — CYCLIC CITRUL PEPTIDE ANTIBODY, IGG: Cyclic Citrullin Peptide Ab: <16 U

## 2024-02-07 NOTE — Progress Notes (Signed)
 ANA positive, anti-CCP negative.

## 2024-02-15 NOTE — Progress Notes (Signed)
 Office Visit Note  Patient: [name]             Date of Birth: May 27, 1996           MRN: [id number]             PCP: Wilhelmena Hanson, FNP Referring: Wilhelmena Hanson, FNP Visit Date: 02/29/2024 Occupation: @GUAROCC @  Subjective:  Pain in joints  History of Present Illness: [name] is a 28 y.o. female with polyarthralgias, positive ANA and sicca symptoms.  She returns today after her initial visit on February 06, 2024.  She states she continues to have pain and stiffness in her neck upper back and lower back.  She also have discomfort in her hands and knee joints.  She continues to have pain in all of her muscles and generalized achiness.  She has dry mouth.  She states she was seen by her dentist who was concerned about her dry mouth.  She was given some prescription toothpaste.  She denies any history of parotid swelling.  There is no history of shortness of breath.    Activities of Daily Living:  Patient reports morning stiffness for 1 hour.   Patient Reports nocturnal pain.  Difficulty dressing/grooming: Denies Difficulty climbing stairs: Denies Difficulty getting out of chair: Denies Difficulty using hands for taps, buttons, cutlery, and/or writing: Denies  Review of Systems  Constitutional:  Positive for fatigue.  HENT:  Positive for mouth dryness. Negative for mouth sores.   Eyes:  Positive for dryness.  Respiratory:  Negative for shortness of breath.   Cardiovascular:  Negative for chest pain and palpitations.  Gastrointestinal:  Positive for constipation. Negative for blood in stool and diarrhea.  Endocrine: Negative for increased urination.  Genitourinary:  Negative for involuntary urination.  Musculoskeletal:  Positive for joint pain, joint pain, myalgias, morning stiffness and myalgias. Negative for gait problem, joint swelling, muscle weakness and muscle tenderness.  Skin:  Positive for sensitivity to sunlight. Negative for color change, rash and  hair loss.  Allergic/Immunologic: Negative for susceptible to infections.  Neurological:  Positive for numbness, headaches and parasthesias. Negative for dizziness.  Hematological:  Negative for swollen glands.  Psychiatric/Behavioral:  Positive for depressed mood and sleep disturbance. The patient is nervous/anxious.     PMFS History:  Patient Active Problem List   Diagnosis Date Noted   Lumbar radiculopathy 12/16/2023   Carpal tunnel syndrome on right 10/26/2023   Thoracic spondylosis 08/02/2023   Bilateral occipital neuralgia 03/18/2023   Chronic bilateral low back pain without sciatica 03/18/2023   Degenerative cervical disc 03/18/2023   Pain in right ankle and joints of right foot 01/18/2023   Pain in right foot 01/18/2023   Sprain of right ankle 01/18/2023   Sprain of right foot 01/18/2023   Disorder of rotator cuff, right 01/11/2023   Right cervical radiculopathy 01/11/2023   Anxiety 05/12/2021   Chronic depression 05/07/2021   Endometriosis, unspecified 05/07/2021   Epilepsy (HCC) 05/07/2021   Family history of neoplasm of breast 05/07/2021   Genital herpes simplex 05/07/2021   Family history of neoplasm of ovary 05/07/2021   History of thrombocytopenia 05/07/2021   Closed fracture of patella 10/13/2018   Cesarean delivery delivered - Repeat 5/24 03/04/2018   Postpartum care following vaginal delivery 03/04/2018   Seizure-like activity (HCC) 11/30/2017   Diet controlled gestational diabetes mellitus (GDM) in second trimester 10/28/2017   Impaired glucose tolerance test 09/23/2017   Chronic pelvic pain in female 02/01/2012    Past  Medical History:  Diagnosis Date   Endometriosis    Gestational diabetes    diet controlled   Otitis media    Seizures (HCC)     Family History  Problem Relation Age of Onset   Diabetes Mother    Lupus Mother        diagnosed age 81   Sjogren's syndrome Mother        diagnosed age 69   Cirrhosis Mother        stage 1   Uterine  cancer Mother    Ovarian cancer Mother    Other Mother        breast mass   Rheum arthritis Mother        diagnosed age 37   Breast cancer Mother    Healthy Father    ADD / ADHD Brother    Autism Brother        borderline   Healthy Brother    Healthy Brother    Healthy Son    Healthy Son    Past Surgical History:  Procedure Laterality Date   CESAREAN SECTION  2014   CESAREAN SECTION N/A 03/04/2018   Procedure: Repeat CESAREAN SECTION;  Surgeon: Zora Hires, MD;  Location: [facility];  Service: Obstetrics;  Laterality: N/A;  EDD: 03/10/18 Allergy: Aspirin, Shellfish   CHOLECYSTECTOMY     LAPAROSCOPY  2012   diagnose endometriosis   TONSILLECTOMY     Social History   Social History Narrative   Lives at home with husband and son. She is currently expecting her second child.   Right-handed.   No caffeine use.   Immunization History  Administered Date(s) Administered   DTaP 01/07/1995, 03/02/1995, 04/27/1995, 06/05/1996, 03/24/2000   Fluzone Influenza virus vaccine,trivalent (IIV3), split virus 12/10/2016   HIB (PRP-OMP) 01/07/1995, 03/02/1995, 04/27/1995, 06/05/1996   HPV Quadrivalent 10/18/2006, 12/15/2006, 07/19/2007   Hepatitis A 10/18/2006, 07/19/2007   Hepatitis B 10/13/1994, 01/07/1995, 05/13/1995   IPV 01/07/1995, 03/02/1995, 04/27/1995, 03/24/2000   Influenza Nasal 07/19/2007, 08/26/2012   Influenza Split 06/25/2010, 07/28/2011   Influenza-Unspecified 07/01/2017, 06/20/2018, 07/11/2019, 07/03/2020, 07/21/2021, 07/27/2022   MMR 06/05/1996, 03/24/2000   Meningococcal Conjugate 10/18/2006, 08/21/2011   PFIZER(Purple Top)SARS-COV-2 Vaccination 11/03/2019, 11/21/2019, 07/26/2020   PPD Test 07/16/2020, 07/30/2020   Tdap 10/18/2006, 07/03/2013, 12/16/2017     Objective: Vital Signs: BP 96/62 (BP Location: Left Arm, Patient Position: Sitting, Cuff Size: Normal)   Pulse 64   Resp 14   Ht 5\' 4"  (1.626 m)   Wt 189 lb 3.2 oz (85.8 kg)   BMI 32.48 kg/m     Physical Exam Vitals and nursing note reviewed.  Constitutional:      Appearance: She is well-developed.  HENT:     Head: Normocephalic and atraumatic.  Eyes:     Conjunctiva/sclera: Conjunctivae normal.  Cardiovascular:     Rate and Rhythm: Normal rate and regular rhythm.     Heart sounds: Normal heart sounds.  Pulmonary:     Effort: Pulmonary effort is normal.     Breath sounds: Normal breath sounds.  Abdominal:     General: Bowel sounds are normal.     Palpations: Abdomen is soft.  Musculoskeletal:     Cervical back: Normal range of motion.  Lymphadenopathy:     Cervical: No cervical adenopathy.  Skin:    General: Skin is warm and dry.     Capillary Refill: Capillary refill takes less than 2 seconds.  Neurological:     Mental Status: She is alert  and oriented to person, place, and time.  Psychiatric:        Behavior: Behavior normal.      Musculoskeletal Exam: Cervical, thoracic and lumbar spine with good range of motion.  She had no SI joint tenderness.  Shoulder joints, elbow joints, wrist joints, MCPs PIPs and DIPs with good range of motion.  Mild PIP and DIP thickening.  Hip joints and knee joints with good range of motion without any warmth swelling or effusion.  There was no tenderness over ankles or MTPs.  She had a few tender points and hyperalgesia.  CDAI Exam: CDAI Score: -- Patient Global: --; Provider Global: -- Swollen: --; Tender: -- Joint Exam 02/29/2024   No joint exam has been documented for this visit   There is currently no information documented on the homunculus. Go to the Rheumatology activity and complete the homunculus joint exam.  Investigation: No additional findings.  Imaging: XR KNEE 3 VIEW LEFT Result Date: 02/01/2024 No significant medial or lateral compartment narrowing was noted.  No patellofemoral narrowing was noted.  Spurring of the patella was noted.  No chondrocalcinosis was noted. Impression: Unremarkable x-rays of the  knee.  XR KNEE 3 VIEW RIGHT Result Date: 02/01/2024 No significant medial or lateral compartment narrowing was noted.  No patellofemoral narrowing was noted.  No chondrocalcinosis was noted. Impression: Unremarkable x-rays of the knee.   Recent Labs: Lab Results  Component Value Date   WBC 3.9 12/03/2023   HGB 15.1 12/03/2023   PLT 192 12/03/2023   NA 144 12/03/2023   K 4.1 12/03/2023   CL 104 12/03/2023   CO2 24 12/03/2023   GLUCOSE 88 12/03/2023   BUN 11 12/03/2023   CREATININE 0.70 12/03/2023   BILITOT 0.3 12/03/2023   ALKPHOS 74 12/03/2023   AST 23 12/03/2023   ALT 29 12/03/2023   PROT 6.8 12/03/2023   ALBUMIN 4.5 12/03/2023   CALCIUM 9.9 12/03/2023   GFRAA 148 05/01/2020   QFTBGOLDPLUS Negative 07/28/2021   February 01, 2024 urine protein creatinine ratio normal, ANA 1: 160 centromere, 1: 80 NS, beta-2  GP 1 negative, C3-C4 normal, anti-CCP negative, vitamin D  41, CK 75 August 16, 2023 ANA negative, rheumatoid factor negative, sed rate 9 December 03, 2023 ANA 1: 80 centromere, ENA (dsDNA, Felipe Horton, Jo 1 RNP, SSA, SSB, SCL 70) negative, anticardiolipin negative, complements normal, TPO negative, LDL 120, hemoglobin A1c 4.9, sed rate 8, RF negative January 18, 2024 B12 675  Speciality Comments: No specialty comments available.  Procedures:  No procedures performed Allergies: Hydrocodone , Sertraline, Shellfish-derived products, Aspirin, Pollen extract, and Mold extract [trichophyton mentagrophyte]   Assessment / Plan:     Visit Diagnoses: Polyarthralgia - History of pain in cervical, thoracic, lumbar spine, hands, knees, ankles and feet.  History of intermittent swelling.  No synovitis was noted on the examination.  She played sports in the past and had several injuries in the past.  Sicca syndrome (HCC) - ANA low titer positive centromere, NS, ENA negative, complements normal.  Labs were reviewed with the patient.  She gives history of dry mouth and mildly dry eye symptoms.   There is no history of Raynaud's phenomenon.  No sclerodactyly or nailbed capillary changes were noted.  There is history of miscarriages.  Beta-2  and anticardiolipin antibodies were negative.  Patient complains of dry mouth and slightly dry eyes.  Which could be related to the use of Cymbalta  and Adderall .  Over-the-counter products for dry mouth symptoms were discussed.  Patient states her  dentist is concerned about her dry mouth.  I offered CT scan of the parotid gland but she would like to hold off for now.  Use of pilocarpine and the side effects of pilocarpine were discussed.  Patient wants to proceed with pilocarpine.  A prescription of pilocarpine 5 mg p.o. 3 times daily was sent.  Advised patient to contact us  if her symptoms get worse.-I will obtain following labs in 1 year.  Patient will contact us  if her symptoms get worse prior to it.  Plan: Urinalysis, Routine w reflex microscopic, CBC with Differential/Platelet, Comprehensive metabolic panel with GFR, ANA, Anti-DNA antibody, double-stranded, C3 and C4, Sedimentation rate, Sjogrens syndrome-A extractable nuclear antibody  Degenerative cervical disc - MRI April 2024.  She continues to have chronic discomfort.  Thoracic spondylosis-previous x-rays showed thoracic spondylosis.  She has constant discomfort.  Chronic bilateral low back pain without sciatica -she has chronic pain.  MRI October 2024 showed lumbar spondylosis L5-S1 with central disc protrusion and facet joint arthropathy.  Dextrocurvature scoliosis.  Carpal tunnel syndrome on right-she had vasovagal response from cortisone injection in the past for right carpal tunnel injection.  Chronic pain of both knees -she continues to have knee joint discomfort.  She had mild osteoarthritic changes and mild chondromalacia patella on the x-rays.  No warmth swelling or effusion was noted.  Lower extremity muscle strengthening exercises were advised.  X-rays were reviewed with the  patient.  Myofascial pain -possibility of myofascial pain/fibromyalgia syndrome was discussed today at length.  She has tender points, hyperalgesia and generalized pain.  I will refer her to physical therapy.  Need for regular exercise including water aerobics, swimming and stretching were discussed.  Good sleep hygiene was discussed.  CK normal.  Vitamin D  deficiency-vitamin D  was normal.  Chronic pelvic pain in female-association of chronic pelvic pain with fibromyalgia was discussed.  Anxiety  Seizure-like activity (HCC)  Bilateral occipital neuralgia-increase takes with fibromyalgia was discussed.  Chronic depression  Endometriosis-associated with endometriosis with fibromyalgia was discussed.  Herpes simplex vulvovaginitis  Family history of neoplasm of breast-mother  Family history of systemic lupus erythematosus (SLE) in mother  Orders: Orders Placed This Encounter  Procedures   Urinalysis, Routine w reflex microscopic   CBC with Differential/Platelet   Comprehensive metabolic panel with GFR   ANA   Anti-DNA antibody, double-stranded   C3 and C4   Sedimentation rate   Sjogrens syndrome-A extractable nuclear antibody   No orders of the defined types were placed in this encounter.   Face-to-face time spent with patient was 30 minutes. Greater than 50% of time was spent in counseling and coordination of care.  Follow-Up Instructions: Return in about 3 months (around 05/31/2024) for Sicca syndrome, positive ANA, polyarthralgia, DDD.   Nicholas Bari, MD  Note - This record has been created using Animal nutritionist.  Chart creation errors have been sought, but may not always  have been located. Such creation errors do not reflect on  the standard of medical care.

## 2024-02-29 ENCOUNTER — Ambulatory Visit: Attending: Rheumatology | Admitting: Rheumatology

## 2024-02-29 ENCOUNTER — Encounter: Payer: Self-pay | Admitting: Rheumatology

## 2024-02-29 ENCOUNTER — Other Ambulatory Visit: Payer: Self-pay

## 2024-02-29 ENCOUNTER — Other Ambulatory Visit (HOSPITAL_COMMUNITY): Payer: Self-pay

## 2024-02-29 VITALS — BP 96/62 | HR 64 | Resp 14 | Ht 64.0 in | Wt 189.2 lb

## 2024-02-29 DIAGNOSIS — M791 Myalgia, unspecified site: Secondary | ICD-10-CM

## 2024-02-29 DIAGNOSIS — M7918 Myalgia, other site: Secondary | ICD-10-CM

## 2024-02-29 DIAGNOSIS — M25561 Pain in right knee: Secondary | ICD-10-CM

## 2024-02-29 DIAGNOSIS — R569 Unspecified convulsions: Secondary | ICD-10-CM

## 2024-02-29 DIAGNOSIS — F419 Anxiety disorder, unspecified: Secondary | ICD-10-CM

## 2024-02-29 DIAGNOSIS — M2550 Pain in unspecified joint: Secondary | ICD-10-CM | POA: Diagnosis not present

## 2024-02-29 DIAGNOSIS — M5030 Other cervical disc degeneration, unspecified cervical region: Secondary | ICD-10-CM | POA: Diagnosis not present

## 2024-02-29 DIAGNOSIS — R102 Pelvic and perineal pain: Secondary | ICD-10-CM

## 2024-02-29 DIAGNOSIS — Z8489 Family history of other specified conditions: Secondary | ICD-10-CM

## 2024-02-29 DIAGNOSIS — G5601 Carpal tunnel syndrome, right upper limb: Secondary | ICD-10-CM

## 2024-02-29 DIAGNOSIS — M5481 Occipital neuralgia: Secondary | ICD-10-CM

## 2024-02-29 DIAGNOSIS — F32A Depression, unspecified: Secondary | ICD-10-CM

## 2024-02-29 DIAGNOSIS — R768 Other specified abnormal immunological findings in serum: Secondary | ICD-10-CM

## 2024-02-29 DIAGNOSIS — N809 Endometriosis, unspecified: Secondary | ICD-10-CM

## 2024-02-29 DIAGNOSIS — Z8269 Family history of other diseases of the musculoskeletal system and connective tissue: Secondary | ICD-10-CM

## 2024-02-29 DIAGNOSIS — E559 Vitamin D deficiency, unspecified: Secondary | ICD-10-CM

## 2024-02-29 DIAGNOSIS — M3500 Sicca syndrome, unspecified: Secondary | ICD-10-CM | POA: Diagnosis not present

## 2024-02-29 DIAGNOSIS — M545 Low back pain, unspecified: Secondary | ICD-10-CM | POA: Diagnosis not present

## 2024-02-29 DIAGNOSIS — G8929 Other chronic pain: Secondary | ICD-10-CM

## 2024-02-29 DIAGNOSIS — A6004 Herpesviral vulvovaginitis: Secondary | ICD-10-CM

## 2024-02-29 DIAGNOSIS — M25562 Pain in left knee: Secondary | ICD-10-CM

## 2024-02-29 DIAGNOSIS — M47814 Spondylosis without myelopathy or radiculopathy, thoracic region: Secondary | ICD-10-CM | POA: Diagnosis not present

## 2024-02-29 MED ORDER — PILOCARPINE HCL 5 MG PO TABS
5.0000 mg | ORAL_TABLET | Freq: Three times a day (TID) | ORAL | 1 refills | Status: AC | PRN
  Filled 2024-02-29: qty 90, 30d supply, fill #0

## 2024-02-29 NOTE — Progress Notes (Signed)
 Please review and sign pended [facility] referral and pilocarpine rx that was advised at patient's appointment today. Thanks!

## 2024-02-29 NOTE — Patient Instructions (Signed)
Pilocarpine Tablets What is this medication? PILOCARPINE (PYE loe KAR peen) treats dry mouth. It works by increasing the amount of saliva in the mouth, which makes it easier to speak and swallow. This medicine may be used for other purposes; ask your health care provider or pharmacist if you have questions. COMMON BRAND NAME(S): Salagen What should I tell my care team before I take this medication? They need to know if you have any of these conditions: Eye infection or other eye problems Glaucoma Heart disease Liver disease Lung or breathing disease, such as asthma An unusual or allergic reaction to pilocarpine, other medications, foods, dyes, or preservatives Pregnant or trying to get pregnant Breast-feeding How should I use this medication? Take this medication by mouth with a full glass of water. Take it as directed on the prescription label at the same time every day. Keep taking it unless your care team tells you to stop. Talk to your care team about the use of this medication in children. Special care may be needed. Overdosage: If you think you have taken too much of this medicine contact a poison control center or emergency room at once. NOTE: This medicine is only for you. Do not share this medicine with others. What if I miss a dose? If you miss a dose, take it as soon as you can. If it is almost time for your next dose, take only that dose. Do not take double or extra doses. What may interact with this medication? Antihistamines for allergy, cough, and cold Atropine Certain medications for Alzheimer disease, such as donepezil, galantamine, rivastigmine Certain medications for bladder problems, such as bethanechol, oxybutynin, tolterodine Certain medications for Parkinson disease, such as benztropine, trihexyphenidyl Certain medications for quitting smoking, such as nicotine Certain medications for stomach problems, such as dicyclomine, hyoscyamine Certain medications for  travel sickness, such as scopolamine Ipratropium Medications for blood pressure or heart problems, such as metoprolol This list may not describe all possible interactions. Give your health care provider a list of all the medicines, herbs, non-prescription drugs, or dietary supplements you use. Also tell them if you smoke, drink alcohol, or use illegal drugs. Some items may interact with your medicine. What should I watch for while using this medication? Visit your care team for regular checks on your progress. Tell your care team if your symptoms do not get better or if they get worse. You may get blurry vision or have trouble telling how far something is from you. This may be a problem at night or when the lights are low. Do not drive, use machinery, or do anything that needs clear vision until you know how this medication affects you. If you sweat a lot, drink enough to replace fluids. Do not get dehydrated. What side effects may I notice from receiving this medication? Side effects that you should report to your care team as soon as possible: Allergic reactions--skin rash, itching, hives, swelling of the face, lips, tongue, or throat Fast or irregular heartbeat Increase in blood pressure Low blood pressure--dizziness, feeling faint or lightheaded, blurry vision Slow heartbeat--dizziness, feeling faint or lightheaded, confusion, trouble breathing, unusual weakness or fatigue Side effects that usually do not require medical attention (report to your care team if they continue or are bothersome): Change in vision Chills Diarrhea Excessive sweating Flushing Headache Increased need to urinate This list may not describe all possible side effects. Call your doctor for medical advice about side effects. You may report side effects to FDA at 1-800-FDA-1088.   Where should I keep my medication? Keep out of the reach of children and pets. Store at room temperature between 15 and 30 degrees C (59 and  86 degrees F). Get rid of any unused medication after the expiration date. To get rid of medications that are no longer needed or have expired: Take the medications to a medication take-back program. Check with your pharmacy or law enforcement to find a location. If your cannot return the medication, check the label or package insert to see if the medication should be thrown out in the garbage or flushed down the toilet. If you are not sure, ask your care team. If it is safe to put it in the trash, take the medication out of the container. Mix the medication with cat litter, dirt, coffee grounds, or other unwanted substance. Seal the mixture in a bag or container. Put it in the trash. NOTE: This sheet is a summary. It may not cover all possible information. If you have questions about this medicine, talk to your doctor, pharmacist, or health care provider.  2024 Elsevier/Gold Standard (2021-09-12 00:00:00)  

## 2024-03-01 ENCOUNTER — Other Ambulatory Visit (HOSPITAL_COMMUNITY): Payer: Self-pay

## 2024-03-02 ENCOUNTER — Other Ambulatory Visit (HOSPITAL_COMMUNITY): Payer: Self-pay

## 2024-03-02 MED ORDER — PREVIDENT 5000 BOOSTER PLUS 1.1 % DT PSTE
1.0000 | PASTE | Freq: Three times a day (TID) | DENTAL | 3 refills | Status: AC
  Filled 2024-03-02 – 2024-03-20 (×2): qty 100, 30d supply, fill #0

## 2024-03-14 ENCOUNTER — Other Ambulatory Visit (HOSPITAL_COMMUNITY): Payer: Self-pay

## 2024-03-16 ENCOUNTER — Other Ambulatory Visit: Payer: Self-pay

## 2024-03-16 ENCOUNTER — Other Ambulatory Visit (HOSPITAL_COMMUNITY): Payer: Self-pay

## 2024-03-16 DIAGNOSIS — M5459 Other low back pain: Secondary | ICD-10-CM | POA: Diagnosis not present

## 2024-03-16 DIAGNOSIS — F5105 Insomnia due to other mental disorder: Secondary | ICD-10-CM | POA: Diagnosis not present

## 2024-03-16 DIAGNOSIS — F330 Major depressive disorder, recurrent, mild: Secondary | ICD-10-CM | POA: Diagnosis not present

## 2024-03-16 DIAGNOSIS — F908 Attention-deficit hyperactivity disorder, other type: Secondary | ICD-10-CM | POA: Diagnosis not present

## 2024-03-16 DIAGNOSIS — M6281 Muscle weakness (generalized): Secondary | ICD-10-CM | POA: Diagnosis not present

## 2024-03-16 DIAGNOSIS — M546 Pain in thoracic spine: Secondary | ICD-10-CM | POA: Diagnosis not present

## 2024-03-16 DIAGNOSIS — D519 Vitamin B12 deficiency anemia, unspecified: Secondary | ICD-10-CM | POA: Diagnosis not present

## 2024-03-16 DIAGNOSIS — M542 Cervicalgia: Secondary | ICD-10-CM | POA: Diagnosis not present

## 2024-03-16 DIAGNOSIS — M25562 Pain in left knee: Secondary | ICD-10-CM | POA: Diagnosis not present

## 2024-03-16 DIAGNOSIS — M357 Hypermobility syndrome: Secondary | ICD-10-CM | POA: Diagnosis not present

## 2024-03-16 DIAGNOSIS — F411 Generalized anxiety disorder: Secondary | ICD-10-CM | POA: Diagnosis not present

## 2024-03-16 MED ORDER — AMPHETAMINE-DEXTROAMPHETAMINE 10 MG PO TABS
10.0000 mg | ORAL_TABLET | Freq: Every day | ORAL | 0 refills | Status: DC
  Filled 2024-03-16: qty 90, 90d supply, fill #0

## 2024-03-16 MED ORDER — FLUOXETINE HCL 20 MG PO CAPS
20.0000 mg | ORAL_CAPSULE | Freq: Every day | ORAL | 0 refills | Status: DC
  Filled 2024-03-16: qty 90, 90d supply, fill #0

## 2024-03-16 MED ORDER — DULOXETINE HCL 60 MG PO CPEP: 60.0000 mg | ORAL_CAPSULE | Freq: Every day | ORAL | 0 refills | Status: DC

## 2024-03-16 MED ORDER — AMPHETAMINE-DEXTROAMPHET ER 30 MG PO CP24: 30.0000 mg | ORAL_CAPSULE | Freq: Every day | ORAL | 0 refills | Status: DC

## 2024-03-20 ENCOUNTER — Other Ambulatory Visit (HOSPITAL_COMMUNITY): Payer: Self-pay

## 2024-03-27 DIAGNOSIS — M357 Hypermobility syndrome: Secondary | ICD-10-CM | POA: Diagnosis not present

## 2024-03-27 DIAGNOSIS — M6281 Muscle weakness (generalized): Secondary | ICD-10-CM | POA: Diagnosis not present

## 2024-03-27 DIAGNOSIS — M25562 Pain in left knee: Secondary | ICD-10-CM | POA: Diagnosis not present

## 2024-03-27 DIAGNOSIS — M546 Pain in thoracic spine: Secondary | ICD-10-CM | POA: Diagnosis not present

## 2024-03-27 DIAGNOSIS — M542 Cervicalgia: Secondary | ICD-10-CM | POA: Diagnosis not present

## 2024-03-27 DIAGNOSIS — M5459 Other low back pain: Secondary | ICD-10-CM | POA: Diagnosis not present

## 2024-03-29 ENCOUNTER — Other Ambulatory Visit (HOSPITAL_COMMUNITY): Payer: Self-pay

## 2024-03-30 DIAGNOSIS — M25562 Pain in left knee: Secondary | ICD-10-CM | POA: Diagnosis not present

## 2024-03-30 DIAGNOSIS — M542 Cervicalgia: Secondary | ICD-10-CM | POA: Diagnosis not present

## 2024-03-30 DIAGNOSIS — M5459 Other low back pain: Secondary | ICD-10-CM | POA: Diagnosis not present

## 2024-03-30 DIAGNOSIS — M546 Pain in thoracic spine: Secondary | ICD-10-CM | POA: Diagnosis not present

## 2024-03-30 DIAGNOSIS — M357 Hypermobility syndrome: Secondary | ICD-10-CM | POA: Diagnosis not present

## 2024-03-30 DIAGNOSIS — M6281 Muscle weakness (generalized): Secondary | ICD-10-CM | POA: Diagnosis not present

## 2024-04-04 DIAGNOSIS — M5459 Other low back pain: Secondary | ICD-10-CM | POA: Diagnosis not present

## 2024-04-04 DIAGNOSIS — M6281 Muscle weakness (generalized): Secondary | ICD-10-CM | POA: Diagnosis not present

## 2024-04-04 DIAGNOSIS — M546 Pain in thoracic spine: Secondary | ICD-10-CM | POA: Diagnosis not present

## 2024-04-04 DIAGNOSIS — M542 Cervicalgia: Secondary | ICD-10-CM | POA: Diagnosis not present

## 2024-04-04 DIAGNOSIS — M357 Hypermobility syndrome: Secondary | ICD-10-CM | POA: Diagnosis not present

## 2024-04-04 DIAGNOSIS — M25562 Pain in left knee: Secondary | ICD-10-CM | POA: Diagnosis not present

## 2024-04-05 ENCOUNTER — Other Ambulatory Visit (HOSPITAL_COMMUNITY): Payer: Self-pay

## 2024-04-05 DIAGNOSIS — F5105 Insomnia due to other mental disorder: Secondary | ICD-10-CM | POA: Diagnosis not present

## 2024-04-05 DIAGNOSIS — D519 Vitamin B12 deficiency anemia, unspecified: Secondary | ICD-10-CM | POA: Diagnosis not present

## 2024-04-05 DIAGNOSIS — F411 Generalized anxiety disorder: Secondary | ICD-10-CM | POA: Diagnosis not present

## 2024-04-05 DIAGNOSIS — F908 Attention-deficit hyperactivity disorder, other type: Secondary | ICD-10-CM | POA: Diagnosis not present

## 2024-04-05 DIAGNOSIS — F330 Major depressive disorder, recurrent, mild: Secondary | ICD-10-CM | POA: Diagnosis not present

## 2024-04-05 MED ORDER — FLUOXETINE HCL 40 MG PO CAPS
40.0000 mg | ORAL_CAPSULE | Freq: Every day | ORAL | 0 refills | Status: AC
  Filled 2024-04-05: qty 90, 90d supply, fill #0

## 2024-04-07 DIAGNOSIS — M5459 Other low back pain: Secondary | ICD-10-CM | POA: Diagnosis not present

## 2024-04-07 DIAGNOSIS — M357 Hypermobility syndrome: Secondary | ICD-10-CM | POA: Diagnosis not present

## 2024-04-07 DIAGNOSIS — M542 Cervicalgia: Secondary | ICD-10-CM | POA: Diagnosis not present

## 2024-04-07 DIAGNOSIS — M546 Pain in thoracic spine: Secondary | ICD-10-CM | POA: Diagnosis not present

## 2024-04-07 DIAGNOSIS — M6281 Muscle weakness (generalized): Secondary | ICD-10-CM | POA: Diagnosis not present

## 2024-04-07 DIAGNOSIS — M25562 Pain in left knee: Secondary | ICD-10-CM | POA: Diagnosis not present

## 2024-04-13 DIAGNOSIS — M357 Hypermobility syndrome: Secondary | ICD-10-CM | POA: Diagnosis not present

## 2024-04-13 DIAGNOSIS — M5459 Other low back pain: Secondary | ICD-10-CM | POA: Diagnosis not present

## 2024-04-13 DIAGNOSIS — M6281 Muscle weakness (generalized): Secondary | ICD-10-CM | POA: Diagnosis not present

## 2024-04-13 DIAGNOSIS — M542 Cervicalgia: Secondary | ICD-10-CM | POA: Diagnosis not present

## 2024-04-13 DIAGNOSIS — M25562 Pain in left knee: Secondary | ICD-10-CM | POA: Diagnosis not present

## 2024-04-13 DIAGNOSIS — M546 Pain in thoracic spine: Secondary | ICD-10-CM | POA: Diagnosis not present

## 2024-04-17 ENCOUNTER — Other Ambulatory Visit (HOSPITAL_COMMUNITY): Payer: Self-pay

## 2024-04-18 DIAGNOSIS — M6281 Muscle weakness (generalized): Secondary | ICD-10-CM | POA: Diagnosis not present

## 2024-04-18 DIAGNOSIS — M357 Hypermobility syndrome: Secondary | ICD-10-CM | POA: Diagnosis not present

## 2024-04-18 DIAGNOSIS — M546 Pain in thoracic spine: Secondary | ICD-10-CM | POA: Diagnosis not present

## 2024-04-18 DIAGNOSIS — M5459 Other low back pain: Secondary | ICD-10-CM | POA: Diagnosis not present

## 2024-04-18 DIAGNOSIS — M25562 Pain in left knee: Secondary | ICD-10-CM | POA: Diagnosis not present

## 2024-04-18 DIAGNOSIS — M542 Cervicalgia: Secondary | ICD-10-CM | POA: Diagnosis not present

## 2024-04-21 DIAGNOSIS — M357 Hypermobility syndrome: Secondary | ICD-10-CM | POA: Diagnosis not present

## 2024-04-21 DIAGNOSIS — M546 Pain in thoracic spine: Secondary | ICD-10-CM | POA: Diagnosis not present

## 2024-04-21 DIAGNOSIS — M5459 Other low back pain: Secondary | ICD-10-CM | POA: Diagnosis not present

## 2024-04-21 DIAGNOSIS — M25562 Pain in left knee: Secondary | ICD-10-CM | POA: Diagnosis not present

## 2024-04-21 DIAGNOSIS — M6281 Muscle weakness (generalized): Secondary | ICD-10-CM | POA: Diagnosis not present

## 2024-04-21 DIAGNOSIS — M542 Cervicalgia: Secondary | ICD-10-CM | POA: Diagnosis not present

## 2024-04-26 ENCOUNTER — Other Ambulatory Visit (HOSPITAL_COMMUNITY): Payer: Self-pay

## 2024-04-26 DIAGNOSIS — F908 Attention-deficit hyperactivity disorder, other type: Secondary | ICD-10-CM | POA: Diagnosis not present

## 2024-04-26 DIAGNOSIS — F411 Generalized anxiety disorder: Secondary | ICD-10-CM | POA: Diagnosis not present

## 2024-04-26 DIAGNOSIS — F5105 Insomnia due to other mental disorder: Secondary | ICD-10-CM | POA: Diagnosis not present

## 2024-04-26 DIAGNOSIS — F330 Major depressive disorder, recurrent, mild: Secondary | ICD-10-CM | POA: Diagnosis not present

## 2024-04-26 DIAGNOSIS — D519 Vitamin B12 deficiency anemia, unspecified: Secondary | ICD-10-CM | POA: Diagnosis not present

## 2024-04-26 MED ORDER — AMPHETAMINE-DEXTROAMPHET ER 30 MG PO CP24
30.0000 mg | ORAL_CAPSULE | Freq: Every day | ORAL | 0 refills | Status: AC
  Filled 2024-04-26: qty 90, 90d supply, fill #0

## 2024-04-26 MED ORDER — FLUOXETINE HCL 40 MG PO CAPS
40.0000 mg | ORAL_CAPSULE | Freq: Every day | ORAL | 0 refills | Status: DC
  Filled 2024-04-26: qty 90, 90d supply, fill #0

## 2024-04-26 MED ORDER — AMPHETAMINE-DEXTROAMPHETAMINE 10 MG PO TABS
10.0000 mg | ORAL_TABLET | Freq: Every day | ORAL | 0 refills | Status: DC
  Filled 2024-04-26: qty 90, 90d supply, fill #0

## 2024-04-26 MED ORDER — DULOXETINE HCL 60 MG PO CPEP: 60.0000 mg | ORAL_CAPSULE | Freq: Every day | ORAL | 0 refills | Status: DC

## 2024-05-08 ENCOUNTER — Other Ambulatory Visit (HOSPITAL_COMMUNITY): Payer: Self-pay

## 2024-05-08 DIAGNOSIS — M25562 Pain in left knee: Secondary | ICD-10-CM | POA: Diagnosis not present

## 2024-05-08 DIAGNOSIS — M546 Pain in thoracic spine: Secondary | ICD-10-CM | POA: Diagnosis not present

## 2024-05-08 DIAGNOSIS — M5459 Other low back pain: Secondary | ICD-10-CM | POA: Diagnosis not present

## 2024-05-08 DIAGNOSIS — M542 Cervicalgia: Secondary | ICD-10-CM | POA: Diagnosis not present

## 2024-05-08 DIAGNOSIS — M6281 Muscle weakness (generalized): Secondary | ICD-10-CM | POA: Diagnosis not present

## 2024-05-08 DIAGNOSIS — M357 Hypermobility syndrome: Secondary | ICD-10-CM | POA: Diagnosis not present

## 2024-05-12 DIAGNOSIS — M546 Pain in thoracic spine: Secondary | ICD-10-CM | POA: Diagnosis not present

## 2024-05-12 DIAGNOSIS — M6281 Muscle weakness (generalized): Secondary | ICD-10-CM | POA: Diagnosis not present

## 2024-05-12 DIAGNOSIS — M5459 Other low back pain: Secondary | ICD-10-CM | POA: Diagnosis not present

## 2024-05-12 DIAGNOSIS — M357 Hypermobility syndrome: Secondary | ICD-10-CM | POA: Diagnosis not present

## 2024-05-12 DIAGNOSIS — M25562 Pain in left knee: Secondary | ICD-10-CM | POA: Diagnosis not present

## 2024-05-12 DIAGNOSIS — M542 Cervicalgia: Secondary | ICD-10-CM | POA: Diagnosis not present

## 2024-05-16 DIAGNOSIS — M25562 Pain in left knee: Secondary | ICD-10-CM | POA: Diagnosis not present

## 2024-05-16 DIAGNOSIS — M357 Hypermobility syndrome: Secondary | ICD-10-CM | POA: Diagnosis not present

## 2024-05-16 DIAGNOSIS — M542 Cervicalgia: Secondary | ICD-10-CM | POA: Diagnosis not present

## 2024-05-16 DIAGNOSIS — M546 Pain in thoracic spine: Secondary | ICD-10-CM | POA: Diagnosis not present

## 2024-05-16 DIAGNOSIS — M5459 Other low back pain: Secondary | ICD-10-CM | POA: Diagnosis not present

## 2024-05-16 DIAGNOSIS — M6281 Muscle weakness (generalized): Secondary | ICD-10-CM | POA: Diagnosis not present

## 2024-05-16 NOTE — Progress Notes (Unsigned)
 Office Visit Note  Patient: [name]             Date of Birth: Jul 12, 1996           MRN: [id number]             PCP: Towana Small, FNP Referring: Towana Small, FNP Visit Date: 05/30/2024 Occupation: @GUAROCC @  Subjective:  Left knee pain   History of Present Illness: [name] is a 28 y.o. female with a history of myofascial pain and DDD.  Patient has established care at integr[facility] therapies.  Overall her neck and shoulder pain as well as lower back pain has improved.  Patient states that there has been concern about possible EDS and hypermobility.  She has continued to have pain, instability, and intermittent swelling in the left knee.  She is been using KT tape as recommended by physical therapy.  She has not been evaluated for EDS in the past. Patient has noticed an improvement since initiating pilocarpine .  She has been taking pilocarpine  5 mg 1 tablet daily.  Patient states that when she tried to take a higher dose of pilocarpine  she noticed an excessive sweating.   Activities of Daily Living:  Patient reports morning stiffness for 30-45 minutes.   Patient Reports nocturnal pain.  Difficulty dressing/grooming: Denies Difficulty climbing stairs: Reports Difficulty getting out of chair: Denies Difficulty using hands for taps, buttons, cutlery, and/or writing: Denies  Review of Systems  Constitutional:  Positive for fatigue.  HENT:  Positive for mouth sores and mouth dryness.   Eyes:  Positive for dryness.  Respiratory:  Negative for shortness of breath.   Cardiovascular:  Negative for chest pain and palpitations.  Gastrointestinal:  Negative for blood in stool, constipation and diarrhea.  Endocrine: Negative for increased urination.  Genitourinary:  Negative for involuntary urination.  Musculoskeletal:  Positive for joint pain, joint pain, joint swelling, myalgias, morning stiffness, muscle tenderness and myalgias. Negative for gait problem and  muscle weakness.  Skin:  Positive for color change and sensitivity to sunlight. Negative for rash and hair loss.  Allergic/Immunologic: Negative for susceptible to infections.  Neurological:  Positive for headaches. Negative for dizziness.  Hematological:  Negative for swollen glands.  Psychiatric/Behavioral:  Positive for sleep disturbance. Negative for depressed mood. The patient is nervous/anxious.     PMFS History:  Patient Active Problem List   Diagnosis Date Noted   Lumbar radiculopathy 12/16/2023   Carpal tunnel syndrome on right 10/26/2023   Thoracic spondylosis 08/02/2023   Bilateral occipital neuralgia 03/18/2023   Chronic bilateral low back pain without sciatica 03/18/2023   Degenerative cervical disc 03/18/2023   Pain in right ankle and joints of right foot 01/18/2023   Pain in right foot 01/18/2023   Sprain of right ankle 01/18/2023   Sprain of right foot 01/18/2023   Disorder of rotator cuff, right 01/11/2023   Right cervical radiculopathy 01/11/2023   Anxiety 05/12/2021   Chronic depression 05/07/2021   Endometriosis, unspecified 05/07/2021   Epilepsy (HCC) 05/07/2021   Family history of neoplasm of breast 05/07/2021   Genital herpes simplex 05/07/2021   Family history of neoplasm of ovary 05/07/2021   History of thrombocytopenia 05/07/2021   Closed fracture of patella 10/13/2018   Cesarean delivery delivered - Repeat 5/24 03/04/2018   Postpartum care following vaginal delivery 03/04/2018   Seizure-like activity (HCC) 11/30/2017   Diet controlled gestational diabetes mellitus (GDM) in second trimester 10/28/2017   Impaired glucose tolerance test 09/23/2017   Chronic  pelvic pain in female 02/01/2012    Past Medical History:  Diagnosis Date   Endometriosis    Gestational diabetes    diet controlled   Otitis media    Seizures (HCC)     Family History  Problem Relation Age of Onset   Diabetes Mother    Lupus Mother        diagnosed age 46   Sjogren's  syndrome Mother        diagnosed age 69   Cirrhosis Mother        stage 1   Uterine cancer Mother    Ovarian cancer Mother    Other Mother        breast mass   Rheum arthritis Mother        diagnosed age 71   Breast cancer Mother    Healthy Father    ADD / ADHD Brother    Autism Brother        borderline   Healthy Brother    Healthy Brother    Healthy Son    Healthy Son    Past Surgical History:  Procedure Laterality Date   CESAREAN SECTION  2014   CESAREAN SECTION N/A 03/04/2018   Procedure: Repeat CESAREAN SECTION;  Surgeon: Linnell Devere BRAVO, MD;  Location: [facility];  Service: Obstetrics;  Laterality: N/A;  EDD: 03/10/18 Allergy: Aspirin, Shellfish   CHOLECYSTECTOMY     LAPAROSCOPY  2012   diagnose endometriosis   TONSILLECTOMY     Social History   Social History Narrative   Lives at home with husband and son. She is currently expecting her second child.   Right-handed.   No caffeine use.   Immunization History  Administered Date(s) Administered   DTaP 01/07/1995, 03/02/1995, 04/27/1995, 06/05/1996, 03/24/2000   Fluzone Influenza virus vaccine,trivalent (IIV3), split virus 12/10/2016   HIB (PRP-OMP) 01/07/1995, 03/02/1995, 04/27/1995, 06/05/1996   HPV Quadrivalent 10/18/2006, 12/15/2006, 07/19/2007   Hepatitis A 10/18/2006, 07/19/2007   Hepatitis B 10/13/1994, 01/07/1995, 05/13/1995   IPV 01/07/1995, 03/02/1995, 04/27/1995, 03/24/2000   Influenza Nasal 07/19/2007, 08/26/2012   Influenza Split 06/25/2010, 07/28/2011   Influenza-Unspecified 07/01/2017, 06/20/2018, 07/11/2019, 07/03/2020, 07/21/2021, 07/27/2022   MMR 06/05/1996, 03/24/2000   Meningococcal Conjugate 10/18/2006, 08/21/2011   PFIZER(Purple Top)SARS-COV-2 Vaccination 11/03/2019, 11/21/2019, 07/26/2020   PPD Test 07/16/2020, 07/30/2020   Tdap 10/18/2006, 07/03/2013, 12/16/2017     Objective: Vital Signs: BP 101/65 (BP Location: Left Arm, Patient Position: Sitting, Cuff Size: Normal)    Pulse 61   Resp 14   Ht 5' 3 (1.6 m)   Wt 191 lb (86.6 kg)   BMI 33.83 kg/m    Physical Exam Vitals and nursing note reviewed.  Constitutional:      Appearance: She is well-developed.  HENT:     Head: Normocephalic and atraumatic.  Eyes:     Conjunctiva/sclera: Conjunctivae normal.  Cardiovascular:     Rate and Rhythm: Normal rate and regular rhythm.     Heart sounds: Normal heart sounds.  Pulmonary:     Effort: Pulmonary effort is normal.     Breath sounds: Normal breath sounds.  Abdominal:     General: Bowel sounds are normal.     Palpations: Abdomen is soft.  Musculoskeletal:     Cervical back: Normal range of motion.  Lymphadenopathy:     Cervical: No cervical adenopathy.  Skin:    General: Skin is warm and dry.     Capillary Refill: Capillary refill takes less than 2 seconds.  Neurological:  Mental Status: She is alert and oriented to person, place, and time.  Psychiatric:        Behavior: Behavior normal.      Musculoskeletal Exam: C-spine, thoracic spine, lumbar spine and good range of motion.  Shoulder joints, elbow joints, wrist joints, MCPs, PIPs, DIPs have good range of motion with no synovitis.  Mild PIP and DIP thickening.  Hip joints have good range of motion with no groin pain.  Discomfort range of motion of the left knee.  No effusion of the knee joints noted.  Ankle joints have good range of motion with no tenderness or joint swelling.  CDAI Exam: CDAI Score: -- Patient Global: --; Provider Global: -- Swollen: --; Tender: -- Joint Exam 05/30/2024   No joint exam has been documented for this visit   There is currently no information documented on the homunculus. Go to the Rheumatology activity and complete the homunculus joint exam.  Investigation: No additional findings.  Imaging: No results found.  Recent Labs: Lab Results  Component Value Date   WBC 3.9 12/03/2023   HGB 15.1 12/03/2023   PLT 192 12/03/2023   NA 144 12/03/2023   K  4.1 12/03/2023   CL 104 12/03/2023   CO2 24 12/03/2023   GLUCOSE 88 12/03/2023   BUN 11 12/03/2023   CREATININE 0.70 12/03/2023   BILITOT 0.3 12/03/2023   ALKPHOS 74 12/03/2023   AST 23 12/03/2023   ALT 29 12/03/2023   PROT 6.8 12/03/2023   ALBUMIN 4.5 12/03/2023   CALCIUM 9.9 12/03/2023   GFRAA 148 05/01/2020   QFTBGOLDPLUS Negative 07/28/2021    Speciality Comments: No specialty comments available.  Procedures:  No procedures performed Allergies: Hydrocodone , Sertraline, Shellfish-derived products, Aspirin, Pollen extract, and Mold extract [trichophyton mentagrophyte]   Assessment / Plan:     Visit Diagnoses: Myofascial pain: Patient has established care at integrative therapies which has been helpful at alleviating some of the generalized myofascial pain.  She is taking Cymbalta  60 mg 1 capsule daily.  According to the patient her physical therapist has brought up the concern for hypermobility such as EDS.  Plan to refer the patient to Dr. Claudene for evaluation of EDS. She will follow up in 6 months or sooner if needed.   Polyarthralgia - History of pain in cervical, thoracic, lumbar spine, hands, knees, ankles and feet.  History of intermittent swelling.  No synovitis noted on examination today.  Can consider updating sed rate and CRP with upcoming lab work in October 2025. Patient has had persistent discomfort in the left knee as well as some hyperflexibility and instability.  A referral to Dr. Claudene will be placed for further evaluation of EDS.  Sicca syndrome (HCC) - ANA low titer, positive centromere, NS, ENA negative, complements normal.  Patient has been taking pilocarpine  5 mg 1 tablet daily which has helped to manage the sicca symptoms.  She tried taking pilocarpine  3 times daily but developed excessive sweating. Can consider updating ANA, right about a, low antibody, RF, SPEP, UA with upcoming lab work in October 2025.  Positive ANA (antinuclear antibody): ANA 1:160  nuclear, centromere, 1: 80NS:  Patient will be having updated lab work in [place] consider ordering ANA, complements, double-stranded DNA, centromere antibody, Ro antibody, La antibody, UA, and SPEP.    Degenerative cervical disc - MRI April 2024.  Her neck pain and stiffness has improved since going to integrative therapies.  Thoracic spondylosis  Chronic bilateral low back pain without sciatica - chronic pain.  MRI October 2024 showed lumbar spondylosis L5-S1 with central disc protrusion and facet joint arthropathy.  Dextrocurvature scoliosis.  Patient has noticed an improvement in her symptoms since going to integrative therapies.  A referral to Dr. Claudene will be placed today.  Carpal tunnel syndrome on right - vasovagal response from cortisone injection in the past for right carpal tunnel injection.  Chronic pain of both knees -X-rays of both knees were unremarkable on 02/01/2024.  Patient has continued to have persistent discomfort in the left knee.  She has been using KT tape as recommended by PT.  She continues to have some hyperflexibility leading to instability.  Plan to refer the patient to Dr. Claudene for further evaluation.  Other medical conditions are listed as follows:  Vitamin D  deficiency  Chronic pelvic pain in female  Anxiety  Seizure-like activity (HCC)  Herpes simplex vulvovaginitis  Bilateral occipital neuralgia  Chronic depression  Endometriosis  Family history of neoplasm of breast-mother  Family history of systemic lupus erythematosus (SLE) in mother  Orders: No orders of the defined types were placed in this encounter.  No orders of the defined types were placed in this encounter.   Follow-Up Instructions: Return in about 6 months (around 11/30/2024) for +ANA.   Waddell CHRISTELLA Craze, PA-C  Note - This record has been created using Dragon software.  Chart creation errors have been sought, but may not always  have been located. Such creation errors do  not reflect on  the standard of medical care.

## 2024-05-17 ENCOUNTER — Encounter: Admitting: Rheumatology

## 2024-05-18 ENCOUNTER — Encounter: Payer: Self-pay | Admitting: Physician Assistant

## 2024-05-18 ENCOUNTER — Encounter: Payer: 59 | Admitting: Family Medicine

## 2024-05-18 DIAGNOSIS — M542 Cervicalgia: Secondary | ICD-10-CM | POA: Diagnosis not present

## 2024-05-18 DIAGNOSIS — M5459 Other low back pain: Secondary | ICD-10-CM | POA: Diagnosis not present

## 2024-05-18 DIAGNOSIS — M6281 Muscle weakness (generalized): Secondary | ICD-10-CM | POA: Diagnosis not present

## 2024-05-18 DIAGNOSIS — M25562 Pain in left knee: Secondary | ICD-10-CM | POA: Diagnosis not present

## 2024-05-18 DIAGNOSIS — M357 Hypermobility syndrome: Secondary | ICD-10-CM | POA: Diagnosis not present

## 2024-05-18 DIAGNOSIS — M546 Pain in thoracic spine: Secondary | ICD-10-CM | POA: Diagnosis not present

## 2024-05-22 DIAGNOSIS — M357 Hypermobility syndrome: Secondary | ICD-10-CM | POA: Diagnosis not present

## 2024-05-22 DIAGNOSIS — M5459 Other low back pain: Secondary | ICD-10-CM | POA: Diagnosis not present

## 2024-05-22 DIAGNOSIS — M25562 Pain in left knee: Secondary | ICD-10-CM | POA: Diagnosis not present

## 2024-05-22 DIAGNOSIS — M542 Cervicalgia: Secondary | ICD-10-CM | POA: Diagnosis not present

## 2024-05-22 DIAGNOSIS — M6281 Muscle weakness (generalized): Secondary | ICD-10-CM | POA: Diagnosis not present

## 2024-05-22 DIAGNOSIS — M546 Pain in thoracic spine: Secondary | ICD-10-CM | POA: Diagnosis not present

## 2024-05-24 DIAGNOSIS — M6281 Muscle weakness (generalized): Secondary | ICD-10-CM | POA: Diagnosis not present

## 2024-05-24 DIAGNOSIS — M546 Pain in thoracic spine: Secondary | ICD-10-CM | POA: Diagnosis not present

## 2024-05-24 DIAGNOSIS — M25562 Pain in left knee: Secondary | ICD-10-CM | POA: Diagnosis not present

## 2024-05-24 DIAGNOSIS — M5459 Other low back pain: Secondary | ICD-10-CM | POA: Diagnosis not present

## 2024-05-24 DIAGNOSIS — M542 Cervicalgia: Secondary | ICD-10-CM | POA: Diagnosis not present

## 2024-05-24 DIAGNOSIS — M357 Hypermobility syndrome: Secondary | ICD-10-CM | POA: Diagnosis not present

## 2024-05-30 ENCOUNTER — Other Ambulatory Visit: Payer: Self-pay

## 2024-05-30 ENCOUNTER — Ambulatory Visit: Attending: Physician Assistant | Admitting: Physician Assistant

## 2024-05-30 ENCOUNTER — Other Ambulatory Visit (HOSPITAL_BASED_OUTPATIENT_CLINIC_OR_DEPARTMENT_OTHER): Payer: Self-pay

## 2024-05-30 ENCOUNTER — Encounter: Payer: Self-pay | Admitting: Physician Assistant

## 2024-05-30 VITALS — BP 101/65 | HR 61 | Resp 14 | Ht 63.0 in | Wt 191.0 lb

## 2024-05-30 DIAGNOSIS — G5601 Carpal tunnel syndrome, right upper limb: Secondary | ICD-10-CM

## 2024-05-30 DIAGNOSIS — M3500 Sicca syndrome, unspecified: Secondary | ICD-10-CM | POA: Diagnosis not present

## 2024-05-30 DIAGNOSIS — A6004 Herpesviral vulvovaginitis: Secondary | ICD-10-CM

## 2024-05-30 DIAGNOSIS — M5481 Occipital neuralgia: Secondary | ICD-10-CM

## 2024-05-30 DIAGNOSIS — M5030 Other cervical disc degeneration, unspecified cervical region: Secondary | ICD-10-CM | POA: Diagnosis not present

## 2024-05-30 DIAGNOSIS — M7918 Myalgia, other site: Secondary | ICD-10-CM

## 2024-05-30 DIAGNOSIS — R102 Pelvic and perineal pain: Secondary | ICD-10-CM | POA: Diagnosis not present

## 2024-05-30 DIAGNOSIS — R768 Other specified abnormal immunological findings in serum: Secondary | ICD-10-CM

## 2024-05-30 DIAGNOSIS — R569 Unspecified convulsions: Secondary | ICD-10-CM

## 2024-05-30 DIAGNOSIS — F32A Depression, unspecified: Secondary | ICD-10-CM

## 2024-05-30 DIAGNOSIS — M25562 Pain in left knee: Secondary | ICD-10-CM

## 2024-05-30 DIAGNOSIS — M545 Low back pain, unspecified: Secondary | ICD-10-CM

## 2024-05-30 DIAGNOSIS — G8929 Other chronic pain: Secondary | ICD-10-CM

## 2024-05-30 DIAGNOSIS — M546 Pain in thoracic spine: Secondary | ICD-10-CM | POA: Diagnosis not present

## 2024-05-30 DIAGNOSIS — N809 Endometriosis, unspecified: Secondary | ICD-10-CM

## 2024-05-30 DIAGNOSIS — F419 Anxiety disorder, unspecified: Secondary | ICD-10-CM

## 2024-05-30 DIAGNOSIS — M6281 Muscle weakness (generalized): Secondary | ICD-10-CM | POA: Diagnosis not present

## 2024-05-30 DIAGNOSIS — E559 Vitamin D deficiency, unspecified: Secondary | ICD-10-CM | POA: Diagnosis not present

## 2024-05-30 DIAGNOSIS — M2550 Pain in unspecified joint: Secondary | ICD-10-CM

## 2024-05-30 DIAGNOSIS — M542 Cervicalgia: Secondary | ICD-10-CM | POA: Diagnosis not present

## 2024-05-30 DIAGNOSIS — M357 Hypermobility syndrome: Secondary | ICD-10-CM | POA: Diagnosis not present

## 2024-05-30 DIAGNOSIS — M25561 Pain in right knee: Secondary | ICD-10-CM

## 2024-05-30 DIAGNOSIS — M47814 Spondylosis without myelopathy or radiculopathy, thoracic region: Secondary | ICD-10-CM

## 2024-05-30 DIAGNOSIS — Z8489 Family history of other specified conditions: Secondary | ICD-10-CM

## 2024-05-30 DIAGNOSIS — M5459 Other low back pain: Secondary | ICD-10-CM | POA: Diagnosis not present

## 2024-05-30 DIAGNOSIS — Z8269 Family history of other diseases of the musculoskeletal system and connective tissue: Secondary | ICD-10-CM

## 2024-06-06 ENCOUNTER — Ambulatory Visit

## 2024-06-07 NOTE — Progress Notes (Unsigned)
   LILLETTE Ileana Collet, PhD, LAT, ATC acting as a scribe for Artist Lloyd, MD.  [name] is a 28 y.o. female who presents to [facility] at [facility] today for hypermobility evaluation and polyarthralgia.  Pt is a Charity fundraiser at [employer], working on the peds floor. RHD.   MS: bilat knee, R shoulder, and neck/back pain Skin/Immune reactions: Raynaud's, skin sensitivity Nervous system: occipital neuralgia Head/Spine: back pain CV: vasovagal/seizure-like activity after the CSI GI: dry mouth Genitourinary: chronic pelvic pain Hands & Feet:  Treatments tried: Lyrica , Cymbalta   Pertinent review of systems: No fevers or chills  Relevant historical information: Chronic neck and back pain.  Hypermobility.   Exam:  BP 98/62   Pulse (!) 59   Ht 5' 3 (1.6 m)   Wt 190 lb (86.2 kg)   SpO2 98%   BMI 33.66 kg/m  General: Well Developed, well nourished, and in no acute distress.   MSK: Hypermobility assessment: Beighton hypermobility score +5/9. Ehlers-Danlos evaluation is positive with 5 of the 12 criteria needed for EDS.  C-spine decreased cervical motion. L-spine normal lumbar motion.   Left knee: Some laxity left patella.  Negative patellar apprehension test.   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  Patient does meet criteria for hypermobility Ehlers-Danlos syndrome.  She also does meet criteria for fibromyalgia and is currently being treated with Cymbalta  which seems to be reasonably helpful.  Plan to maintain activity level.  Plan to continue physical therapy and integrative therapies.  Continue knee bracing especially for the left knee patellar instability or potentially Kinesiotape.  Recommend recheck with Dr. Lazarus pain management in [place] to consider repeat epidural steroid injection or facet injection lumbar spine.  This could be helpful.  We spent time talking about dysautonomia/POTS and strategies to  help mitigate that.  Recheck in 2 months.  PDMP not reviewed this encounter. No orders of the defined types were placed in this encounter.  No orders of the defined types were placed in this encounter.    Discussed warning signs or symptoms. Please see discharge instructions. Patient expresses understanding.   The above documentation has been reviewed and is accurate and complete Artist Lloyd, M.D.

## 2024-06-08 ENCOUNTER — Ambulatory Visit (INDEPENDENT_AMBULATORY_CARE_PROVIDER_SITE_OTHER): Admitting: Family Medicine

## 2024-06-08 VITALS — BP 98/62 | HR 59 | Ht 63.0 in | Wt 190.0 lb

## 2024-06-08 DIAGNOSIS — M545 Low back pain, unspecified: Secondary | ICD-10-CM

## 2024-06-08 DIAGNOSIS — G8929 Other chronic pain: Secondary | ICD-10-CM

## 2024-06-08 DIAGNOSIS — Q7962 Hypermobile Ehlers-Danlos syndrome: Secondary | ICD-10-CM | POA: Diagnosis not present

## 2024-06-08 NOTE — Patient Instructions (Addendum)
 Thank you for coming in today.   [email or web address]  POTS up to 3L of water and up to 8-12g of salt a day.   Check out the book Disjointed Navigating the Diagnosis and Management of Hypermobile Ehlers-Danlos Syndrome and Hypermobility Spectrum Disorders.   Continue PT and home exercises  Check back in 2 months

## 2024-06-23 ENCOUNTER — Ambulatory Visit: Admitting: Rheumatology

## 2024-07-12 ENCOUNTER — Ambulatory Visit

## 2024-07-12 ENCOUNTER — Other Ambulatory Visit: Payer: Self-pay

## 2024-07-12 DIAGNOSIS — Z0000 Encounter for general adult medical examination without abnormal findings: Secondary | ICD-10-CM

## 2024-07-13 LAB — CBC WITH DIFFERENTIAL/PLATELET
Basophils Absolute: 0 x10E3/uL (ref 0.0–0.2)
Basos: 1 %
EOS (ABSOLUTE): 0.1 x10E3/uL (ref 0.0–0.4)
Eos: 2 %
Hematocrit: 44.4 % (ref 34.0–46.6)
Hemoglobin: 14.9 g/dL (ref 11.1–15.9)
Immature Grans (Abs): 0 x10E3/uL (ref 0.0–0.1)
Immature Granulocytes: 0 %
Lymphocytes Absolute: 1.4 x10E3/uL (ref 0.7–3.1)
Lymphs: 38 %
MCH: 31.2 pg (ref 26.6–33.0)
MCHC: 33.6 g/dL (ref 31.5–35.7)
MCV: 93 fL (ref 79–97)
Monocytes Absolute: 0.4 x10E3/uL (ref 0.1–0.9)
Monocytes: 11 %
Neutrophils Absolute: 1.8 x10E3/uL (ref 1.4–7.0)
Neutrophils: 47 %
Platelets: 181 x10E3/uL (ref 150–450)
RBC: 4.77 x10E6/uL (ref 3.77–5.28)
RDW: 12.2 % (ref 11.7–15.4)
WBC: 3.8 x10E3/uL (ref 3.4–10.8)

## 2024-07-13 LAB — LIPID PANEL
Chol/HDL Ratio: 4.3 ratio (ref 0.0–4.4)
Cholesterol, Total: 167 mg/dL (ref 100–199)
HDL: 39 mg/dL — ABNORMAL LOW (ref >39)
LDL Chol Calc (NIH): 118 mg/dL — ABNORMAL HIGH (ref 0–99)
Triglycerides: 48 mg/dL (ref 0–149)
VLDL Cholesterol Cal: 10 mg/dL (ref 5–40)

## 2024-07-13 LAB — HEMOGLOBIN A1C
Est. average glucose Bld gHb Est-mCnc: 94 mg/dL
Hgb A1c MFr Bld: 4.9 % (ref 4.8–5.6)

## 2024-07-13 LAB — COMPREHENSIVE METABOLIC PANEL WITH GFR
ALT: 25 IU/L (ref 0–32)
AST: 16 IU/L (ref 0–40)
Albumin: 4.3 g/dL (ref 4.0–5.0)
Alkaline Phosphatase: 70 IU/L (ref 41–116)
BUN/Creatinine Ratio: 23 (ref 9–23)
BUN: 14 mg/dL (ref 6–20)
Bilirubin Total: 0.5 mg/dL (ref 0.0–1.2)
CO2: 21 mmol/L (ref 20–29)
Calcium: 9.3 mg/dL (ref 8.7–10.2)
Chloride: 105 mmol/L (ref 96–106)
Creatinine, Ser: 0.62 mg/dL (ref 0.57–1.00)
Globulin, Total: 2.1 g/dL (ref 1.5–4.5)
Glucose: 97 mg/dL (ref 70–99)
Potassium: 4.2 mmol/L (ref 3.5–5.2)
Sodium: 139 mmol/L (ref 134–144)
Total Protein: 6.4 g/dL (ref 6.0–8.5)
eGFR: 124 mL/min/1.73 (ref >59)

## 2024-07-13 LAB — TSH RFX ON ABNORMAL TO FREE T4: TSH: 2.6 u[IU]/mL (ref 0.450–4.500)

## 2024-07-14 ENCOUNTER — Ambulatory Visit: Payer: Self-pay | Admitting: Family Medicine

## 2024-07-19 ENCOUNTER — Encounter: Payer: Self-pay | Admitting: Family Medicine

## 2024-07-19 ENCOUNTER — Ambulatory Visit (INDEPENDENT_AMBULATORY_CARE_PROVIDER_SITE_OTHER): Admitting: Family Medicine

## 2024-07-19 VITALS — BP 100/69 | HR 65 | Ht 63.0 in | Wt 192.0 lb

## 2024-07-19 DIAGNOSIS — M3500 Sicca syndrome, unspecified: Secondary | ICD-10-CM

## 2024-07-19 DIAGNOSIS — Z0000 Encounter for general adult medical examination without abnormal findings: Secondary | ICD-10-CM

## 2024-07-19 NOTE — Progress Notes (Signed)
 Subjective:   [name] [birth year]  07/19/2024   CC: Chief Complaint  Patient presents with   Annual Exam   HPI: [name] is a 28 y.o. female who presents for a routine health maintenance exam.  Fasting labs collected prior to visit.   HEALTH SCREENINGS: - Vision Screening: up to date - Dental Visits: up to date - Pap smear: scheduled with [facility] - Breast Exam: scheduled - STD Screening: Declined - Mammogram (40+): Not applicable  - Colonoscopy (45+): Not applicable  - Bone Density (65+ or under 65 with predisposing conditions): Not applicable  - Lung CA screening with low-dose CT:  Not applicable Adults age 34-80 who are current cigarette smokers or quit within the last 15 years. Must have 20 pack year history.   Depression and Anxiety Screen done today and results listed below:     07/19/2024    1:27 PM 01/18/2024   10:51 AM 12/03/2023    8:41 AM 11/10/2023    1:22 PM 10/26/2023    4:26 PM  Depression screen PHQ 2/9  Decreased Interest 1 1 1 2 1   Down, Depressed, Hopeless 1  2 1  0  PHQ - 2 Score 2 1 3 3 1   Altered sleeping 2 2 0 0 2  Tired, decreased energy 2 2 1  0 2  Change in appetite 0 0 0 0 0  Feeling bad or failure about yourself  0 1 0 0 0  Trouble concentrating 1  2 0 1  Moving slowly or fidgety/restless 1 0 0 0 0  Suicidal thoughts 0 0 0 0 0  PHQ-9 Score 8 6 6 3 6   Difficult doing work/chores Somewhat difficult Somewhat difficult Somewhat difficult Not difficult at all Somewhat difficult      07/19/2024    1:28 PM 01/18/2024   10:51 AM 12/03/2023    8:41 AM 10/26/2023    4:27 PM  GAD 7 : Generalized Anxiety Score  Nervous, Anxious, on Edge 1 2 2 2   Control/stop worrying 2 2 1 1   Worry too much - different things 2 1 2 1   Trouble relaxing 1 2 1 1   Restless 0 1 0 0  Easily annoyed or irritable 1 2 1 1   Afraid - awful might happen 0 0 0 0  Total GAD 7 Score 7 10 7 6   Anxiety Difficulty Somewhat difficult Somewhat difficult  Somewhat difficult Somewhat difficult   IMMUNIZATIONS: - Tdap: Tetanus vaccination status reviewed: last tetanus booster within 10 years. - HPV: Up to date - Influenza: Up to date - Pneumovax: Not applicable - Prevnar 20: Not applicable - Zostavax (50+): Not applicable   Past medical history, surgical history, medications, allergies, family history and social history reviewed with patient today and changes made to appropriate areas of the chart.   Past Medical History:  Diagnosis Date   Allergy    Anxiety    on medication   Depression    on medication   Endometriosis    Gestational diabetes    diet controlled   Neuromuscular disorder (HCC)    Ehlers-Danlos Syndrome   Otitis media    Seizures (HCC)     Past Surgical History:  Procedure Laterality Date   CESAREAN SECTION  2014   CESAREAN SECTION N/A 03/04/2018   Procedure: Repeat CESAREAN SECTION;  Surgeon: Linnell Devere BRAVO, MD;  Location: [facility]IRTHING SU[facility];  Service: Obstetrics;  Laterality: N/A;  EDD: 03/10/18 Allergy: Aspirin, Shellfish   CHOLECYSTECTOMY  LAPAROSCOPY  2012   diagnose endometriosis   TONSILLECTOMY      Current Outpatient Medications on File Prior to Visit  Medication Sig   amphetamine -dextroamphetamine  (ADDERALL  XR) 30 MG 24 hr capsule Take 1 capsule (30 mg total) by mouth daily with breakfast.   DULoxetine  (CYMBALTA ) 60 MG capsule Take 1 capsule (60 mg total) by mouth daily.   FLUoxetine  (PROZAC ) 40 MG capsule Take 1 capsule (40 mg total) by mouth daily with breakfast.   pilocarpine  (SALAGEN ) 5 MG tablet Take 1 tablet (5 mg total) by mouth 3 (three) times daily as needed. (Patient taking differently: Take 5 mg by mouth daily.)   Sodium Fluoride  (PREVIDENT  5000 BOOSTER PLUS) 1.1 % PSTE Use to brush teeth 3 (three) times daily as directed.   [DISCONTINUED] promethazine  (PHENERGAN ) 12.5 MG tablet Take 12.5 mg by mouth every 6 (six) hours as needed for nausea or vomiting.   [DISCONTINUED] simethicone   (MYLICON) 80 MG chewable tablet Chew 1 tablet (80 mg total) by mouth as needed for flatulence.   No current facility-administered medications on file prior to visit.    Allergies  Allergen Reactions   Hydrocodone  Hives, Shortness Of Breath and Rash   Sertraline Rash   Shellfish-Derived Products Nausea And Vomiting   Aspirin Hives and Other (See Comments)    Pt states that she can tolerate ibuprofen    Pollen Extract Other (See Comments)    Seasonal allergy   Mold Extract [Trichophyton Mentagrophyte] Itching and Rash     Social History   Socioeconomic History   Marital status: Married    Spouse name: Not on file   Number of children: 2   Years of education: some college   Highest education level: Associate degree: academic program  Occupational History   Occupation: CNA  Tobacco Use   Smoking status: Never    Passive exposure: Never   Smokeless tobacco: Never  Vaping Use   Vaping status: Never Used  Substance and Sexual Activity   Alcohol use: Yes    Alcohol/week: 1.0 standard drink of alcohol    Types: 1 Glasses of wine per week    Comment: occ   Drug use: No   Sexual activity: Yes    Birth control/protection: I.U.D.  Other Topics Concern   Not on file  Social History Narrative   Lives at home with husband and son. She is currently expecting her second child.   Right-handed.   No caffeine use.   Social Drivers of Corporate investment banker Strain: Low Risk  (07/15/2024)   Overall Financial Resource Strain (CARDIA)    Difficulty of Paying Living Expenses: Not very hard  Food Insecurity: No Food Insecurity (07/15/2024)   Hunger Vital Sign    Worried About Running Out of Food in the Last Year: Never true    Ran Out of Food in the Last Year: Never true  Transportation Needs: No Transportation Needs (07/15/2024)   PRAPARE - Administrator, Civil Service (Medical): No    Lack of Transportation (Non-Medical): No  Physical Activity: Insufficiently Active  (07/15/2024)   Exercise Vital Sign    Days of Exercise per Week: 2 days    Minutes of Exercise per Session: 40 min  Stress: Stress Concern Present (07/15/2024)   [employer] of Occupational Health - Occupational Stress Questionnaire    Feeling of Stress: Very much  Social Connections: Moderately Integrated (07/15/2024)   Social Connection and Isolation Panel    Frequency of Communication with Friends  and Family: Three times a week    Frequency of Social Gatherings with Friends and Family: Twice a week    Attends Religious Services: More than 4 times per year    Active Member of [employer] or Organizations: No    Attends Engineer, structural: Not on file    Marital Status: Married  Catering manager Violence: Not on file   Social History   Tobacco Use  Smoking Status Never   Passive exposure: Never  Smokeless Tobacco Never   Social History   Substance and Sexual Activity  Alcohol Use Yes   Alcohol/week: 1.0 standard drink of alcohol   Types: 1 Glasses of wine per week   Comment: occ    Family History  Problem Relation Age of Onset   Diabetes Mother    Lupus Mother        diagnosed age 50   Sjogren's syndrome Mother        diagnosed age 21   Cirrhosis Mother        stage 1   Uterine cancer Mother    Ovarian cancer Mother    Other Mother        breast mass   Rheum arthritis Mother        diagnosed age 31   Breast cancer Mother    Arthritis Mother    Cancer Mother    Depression Mother    Heart disease Mother    Obesity Mother    ADD / ADHD Father    ADD / ADHD Brother    Autism Brother        borderline   Healthy Brother    Healthy Brother    Healthy Son    Healthy Son    Alcohol abuse Maternal Grandmother    Cancer Maternal Grandmother    ADD / ADHD Brother    Anxiety disorder Brother    Depression Brother    Learning disabilities Brother    Obesity Brother    Heart disease Brother      ROS: Denies fever, fatigue, unexplained weight  loss/gain, chest pain, SHOB, and palpitations. Denies neurological deficits, gastrointestinal or genitourinary complaints, and skin changes.   Objective:   Today's Vitals   07/19/24 1310  BP: 100/69  Pulse: 65  SpO2: 97%  Weight: 192 lb (87.1 kg)  Height: 5' 3 (1.6 m)  PainSc: 0-No pain    GENERAL APPEARANCE: Well-appearing, in NAD. Well nourished.  SKIN: Pink, warm and dry. Turgor normal. No rash, lesion, ulceration, or ecchymoses. Hair evenly distributed.  HEENT: HEAD: Normocephalic.  EYES: PERRLA. EOMI. Lids intact w/o defect. Sclera white, Conjunctiva pink w/o exudate.  EARS: External ear w/o redness, swelling, masses or lesions. EAC clear. TM's intact, translucent w/o bulging, appropriate landmarks visualized. Appropriate acuity to conversational tones.  NOSE: Septum midline w/o deformity. Nares patent, mucosa pink and non-inflamed w/o drainage. No sinus tenderness.  THROAT: Uvula midline. Oropharynx clear. Tonsils non-inflamed w/o exudate. Oral mucosa pink and moist.  NECK: Supple, Trachea midline. Full ROM w/o pain or tenderness. No lymphadenopathy. Thyroid  non-tender w/o enlargement or palpable masses.  BREASTS: Breasts pendulous, symmetrical, and w/o palpable masses. Nipples everted and w/o discharge. No rash or skin retraction. No axillary or supraclavicular lymphadenopathy.  RESPIRATORY: Chest wall symmetrical w/o masses. Respirations even and non-labored. Breath sounds clear to auscultation bilaterally. No wheezes, rales, rhonchi, or crackles. CARDIAC: S1, S2 present, regular rate and rhythm. No gallops, murmurs, rubs, or clicks. PMI w/o lifts, heaves, or thrills. No carotid  bruits. Capillary refill <2 seconds. Peripheral pulses 2+ bilaterally. GI: Abdomen soft w/o distention. Normoactive bowel sounds. No palpable masses or tenderness. No guarding or rebound tenderness. Liver and spleen w/o tenderness or enlargement. No CVA tenderness.  GU: External genitalia without erythema,  lesions, or masses. No lymphadenopathy. Vaginal mucosa pink and moist without exudate, lesions, or ulcerations. Cervix pink without discharge. Cervical os closed. Uterus and adnexae palpable, not enlarged, and w/o tenderness. No palpable masses.  MSK: Muscle tone and strength appropriate for age, w/o atrophy or abnormal movement.  EXTREMITIES: Active ROM intact, w/o tenderness, crepitus, or contracture. No obvious joint deformities or effusions. No clubbing, edema, or cyanosis.  NEUROLOGIC: CN's II-XII intact. Motor strength symmetrical with no obvious weakness. No sensory deficits. DTR's 2+ symmetric bilaterally. Steady, even gait.  PSYCH/MENTAL STATUS: Alert, oriented x 3. Cooperative, appropriate mood and affect.   Results for orders placed or performed in visit on 07/12/24  CBC with Differential/Platelet   Collection Time: 07/12/24  8:30 AM  Result Value Ref Range   WBC 3.8 3.4 - 10.8 x10E3/uL   RBC 4.77 3.77 - 5.28 x10E6/uL   Hemoglobin 14.9 11.1 - 15.9 g/dL   Hematocrit 55.5 65.9 - 46.6 %   MCV 93 79 - 97 fL   MCH 31.2 26.6 - 33.0 pg   MCHC 33.6 31.5 - 35.7 g/dL   RDW 87.7 88.2 - 84.5 %   Platelets 181 150 - 450 x10E3/uL   Neutrophils 47 Not Estab. %   Lymphs 38 Not Estab. %   Monocytes 11 Not Estab. %   Eos 2 Not Estab. %   Basos 1 Not Estab. %   Neutrophils Absolute 1.8 1.4 - 7.0 x10E3/uL   Lymphocytes Absolute 1.4 0.7 - 3.1 x10E3/uL   Monocytes Absolute 0.4 0.1 - 0.9 x10E3/uL   EOS (ABSOLUTE) 0.1 0.0 - 0.4 x10E3/uL   Basophils Absolute 0.0 0.0 - 0.2 x10E3/uL   Immature Granulocytes 0 Not Estab. %   Immature Grans (Abs) 0.0 0.0 - 0.1 x10E3/uL  Comprehensive metabolic panel with GFR   Collection Time: 07/12/24  8:30 AM  Result Value Ref Range   Glucose 97 70 - 99 mg/dL   BUN 14 6 - 20 mg/dL   Creatinine, Ser 9.37 0.57 - 1.00 mg/dL   eGFR 875 >40 fO/fpw/8.26   BUN/Creatinine Ratio 23 9 - 23   Sodium 139 134 - 144 mmol/L   Potassium 4.2 3.5 - 5.2 mmol/L   Chloride 105 96  - 106 mmol/L   CO2 21 20 - 29 mmol/L   Calcium 9.3 8.7 - 10.2 mg/dL   Total Protein 6.4 6.0 - 8.5 g/dL   Albumin 4.3 4.0 - 5.0 g/dL   Globulin, Total 2.1 1.5 - 4.5 g/dL   Bilirubin Total 0.5 0.0 - 1.2 mg/dL   Alkaline Phosphatase 70 41 - 116 IU/L   AST 16 0 - 40 IU/L   ALT 25 0 - 32 IU/L  Hemoglobin A1c   Collection Time: 07/12/24  8:30 AM  Result Value Ref Range   Hgb A1c MFr Bld 4.9 4.8 - 5.6 %   Est. average glucose Bld gHb Est-mCnc 94 mg/dL  Lipid panel   Collection Time: 07/12/24  8:30 AM  Result Value Ref Range   Cholesterol, Total 167 100 - 199 mg/dL   Triglycerides 48 0 - 149 mg/dL   HDL 39 (L) >60 mg/dL   VLDL Cholesterol Cal 10 5 - 40 mg/dL   LDL Chol Calc (NIH) 881 (H)  0 - 99 mg/dL   Chol/HDL Ratio 4.3 0.0 - 4.4 ratio  TSH Rfx on Abnormal to Free T4   Collection Time: 07/12/24  8:30 AM  Result Value Ref Range   TSH 2.600 0.450 - 4.500 uIU/mL    Assessment & Plan:   1. Wellness examination (Primary) - Encouraged a healthy well-balanced diet. Patient may adjust caloric intake to maintain or achieve ideal body weight. May reduce intake of dietary saturated fat and total fat and have adequate dietary potassium and calcium preferably from fresh fruits, vegetables, and low-fat dairy products.   - Advised to avoid cigarette smoking. - Discussed with the patient that most people either abstain from alcohol or drink within safe limits (<=14/week and <=4 drinks/occasion for males, <=7/weeks and <= 3 drinks/occasion for females) and that the risk for alcohol disorders and other health effects rises proportionally with the number of drinks per week and how often a drinker exceeds daily limits. - Discussed cessation/primary prevention of drug use and availability of treatment for abuse.  - Discussed sexually transmitted diseases, avoidance of unintended pregnancy and contraceptive alternatives. - Stressed the importance of regular exercise - Injury prevention: Discussed safety  belts, safety helmets, smoke detector, smoking near bedding or upholstery.  - Dental health: Discussed importance of regular tooth brushing, flossing, and dental visits.  NEXT PREVENTATIVE PHYSICAL DUE IN 1 YEAR.  2. Sicca syndrome Patient followed by rheumatology and planned to complete specialized lab work with physical labs. Will order and complete today and update Dr. Dolphus.   - ANA - Anti-DNA antibody, double-stranded - C3 and C4 - CBC with Differential/Platelet - Comprehensive metabolic panel with GFR - Sed Rate (ESR) - Sjogrens syndrome-A extractable nuclear antibody - Urinalysis, Routine w reflex microscopic   Return in about 1 year (around 07/19/2025) for Physical with fasting labs.  Patient to reach out to office if new, worrisome, or unresolved symptoms arise or if no improvement in patient's condition. Patient verbalized understanding and is agreeable to treatment plan. All questions answered to patient's satisfaction.   Evalene Arts, FNP

## 2024-07-19 NOTE — Patient Instructions (Signed)
 Health Maintenance Recommendations Screening Testing Mammogram Every 1 -2 years based on history and risk factors Starting at age 30 Please call to schedule: [facility]  [address], [place], [place] [address] Phone: [phone] [facility] [address], [place], [place] [address] Phone: [phone] Pap Smear Ages 21-39 every 3 years Ages 32-65 every 5 years with HPV testing More frequent testing may be required based on results and history Colon Cancer Screening Every 1-10 years based on test performed, risk factors, and history Starting at age 47 Bone Density Screening Every 2-10 years based on history Starting at age 34 for women Recommendations for men differ based on medication usage, history, and risk factors AAA Screening One time ultrasound Men 44-26 years old who have every smoked Lung Cancer Screening Low Dose Lung CT every 12 months Age 14-80 years with a 30 pack-year smoking history who still smoke or who have quit within the last 15 years   Screening Labs Routine  Labs: Complete Blood Count (CBC), Complete Metabolic Panel (CMP), Cholesterol (Lipid Panel) Every 6-12 months based on history and medications May be recommended more frequently based on current conditions or previous results Hemoglobin A1c Lab Every 3-12 months based on history and previous results Starting at age 82 or earlier with diagnosis of diabetes, high cholesterol, BMI >26, and/or risk factors Frequent monitoring for patients with diabetes to ensure blood sugar control Thyroid  Panel (TSH w/ T3 & T4) Every 6 months based on history, symptoms, and risk factors May be repeated more often if on medication HIV One time testing for all patients 76 and older May be repeated more frequently for patients with increased risk factors or exposure Hepatitis C One time testing for all patients 13 and older May be repeated more frequently for patients with increased risk factors or  exposure Gonorrhea, Chlamydia Every 12 months for all sexually active persons 13-24 years Additional monitoring may be recommended for those who are considered high risk or who have symptoms PSA Men 100-42 years old with risk factors Additional screening may be recommended from age 3-69 based on risk factors, symptoms, and history   Vaccine Recommendations Tetanus Booster All adults every 10 years Flu Vaccine All patients 6 months and older every year COVID Vaccine All patients 12 years and older Initial dosing with booster May recommend additional booster based on age and health history HPV Vaccine 2 doses all patients age 53-26 Dosing may be considered for patients over 26 Shingles Vaccine (Shingrix) 2 doses all adults 55 years and older Pneumonia (Pneumovax 87) All adults 65 years and older May recommend earlier dosing based on health history Pneumonia (Prevnar 47) All adults 65 years and older Dosed 1 year after Pneumovax 23   Additional Screening, Testing, and Vaccinations may be recommended on an individualized basis based on family history, health history, risk factors, and/or exposure.  __________________________________________________________   Diet Recommendations for All Patients   I recommend that all patients maintain a diet low in saturated fats, carbohydrates, and cholesterol. While this can be challenging at first, it is not impossible and small changes can make big differences.  Things to try: Decreasing the amount of soda, sweet tea, and/or juice to one or less per day and replace with water  While water  is always the first choice, if you do not like water  you may consider adding a water  additive without sugar to improve the taste other sugar free drinks Replace potatoes with a brightly colored vegetable at dinner  Use healthy oils, such as canola oil or olive oil, instead of butter or hard margarine Limit your bread intake to two pieces or less a  day Replace regular pasta with low carb pasta options Bake, broil, or grill foods instead of frying Monitor portion sizes  Eat smaller, more frequent meals throughout the day instead of large meals   An important thing to remember is, if you love foods that are not great for your health, you don't have to give them up completely. Instead, allow these foods to be a reward when you have done well. Allowing yourself to still have special treats every once in a while is a nice way to tell yourself thank you for working hard to keep yourself healthy.    Also remember that every day is a new day. If you have a bad day and fall off the wagon, you can still climb right back up and keep moving along on your journey!   We have resources available to help you!  Some websites that may be helpful include: www.http://www.wall-moore.info/        Www.VeryWellFit.com _____________________________________________________________   Activity Recommendations for All Patients   I recommend that all adults get at least 20 minutes of moderate physical activity that elevates your heart rate at least 5 days out of the week.  Some examples include: Walking or jogging at a pace that allows you to carry on a conversation Cycling (stationary bike or outdoors) Water  aerobics Yoga Weight lifting Dancing If physical limitations prevent you from putting stress on your joints, exercise in a pool or seated in a chair are excellent options.   Do determine your MAXIMUM heart rate for activity: YOUR AGE - 220 = MAX HeartRate    Remember! Do not push yourself too hard.  Start slowly and build up your pace, speed, weight, time in exercise, etc.  Allow your body to rest between exercise and get good sleep. You will need more water  than normal when you are exerting yourself. Do not wait until you are thirsty to drink. Drink with a purpose of getting in at least 8, 8 ounce glasses of water  a day plus more depending on how much you exercise  and sweat.      If you begin to develop dizziness, chest pain, abdominal pain, jaw pain, shortness of breath, headache, vision changes, lightheadedness, or other concerning symptoms, stop the activity and allow your body to rest. If your symptoms are severe, seek emergency evaluation immediately. If your symptoms are concerning, but not severe, please let us  know so that we can recommend further evaluation.

## 2024-07-21 LAB — URINALYSIS, ROUTINE W REFLEX MICROSCOPIC
Bilirubin, UA: NEGATIVE
Glucose, UA: NEGATIVE
Ketones, UA: NEGATIVE
Leukocytes,UA: NEGATIVE
Nitrite, UA: NEGATIVE
Protein,UA: NEGATIVE
RBC, UA: NEGATIVE
Specific Gravity, UA: 1.023 (ref 1.005–1.030)
Urobilinogen, Ur: 1 mg/dL (ref 0.2–1.0)
pH, UA: 6.5 (ref 5.0–7.5)

## 2024-07-21 LAB — SEDIMENTATION RATE: Sed Rate: 17 mm/h (ref 0–32)

## 2024-07-21 LAB — ANTI-DNA ANTIBODY, DOUBLE-STRANDED: dsDNA Ab: <1 [IU]/mL (ref 0–9)

## 2024-07-21 LAB — CBC WITH DIFFERENTIAL/PLATELET
Basophils Absolute: 0 x10E3/uL (ref 0.0–0.2)
Basos: 1 %
EOS (ABSOLUTE): 0.1 x10E3/uL (ref 0.0–0.4)
Eos: 2 %
Hematocrit: 41.5 % (ref 34.0–46.6)
Hemoglobin: 14 g/dL (ref 11.1–15.9)
Immature Grans (Abs): 0 x10E3/uL (ref 0.0–0.1)
Immature Granulocytes: 0 %
Lymphocytes Absolute: 1.2 x10E3/uL (ref 0.7–3.1)
Lymphs: 34 %
MCH: 31.3 pg (ref 26.6–33.0)
MCHC: 33.7 g/dL (ref 31.5–35.7)
MCV: 93 fL (ref 79–97)
Monocytes Absolute: 0.4 x10E3/uL (ref 0.1–0.9)
Monocytes: 10 %
Neutrophils Absolute: 1.9 x10E3/uL (ref 1.4–7.0)
Neutrophils: 53 %
Platelets: 183 x10E3/uL (ref 150–450)
RBC: 4.47 x10E6/uL (ref 3.77–5.28)
RDW: 12.2 % (ref 11.7–15.4)
WBC: 3.6 x10E3/uL (ref 3.4–10.8)

## 2024-07-21 LAB — COMPREHENSIVE METABOLIC PANEL WITH GFR
ALT: 14 IU/L (ref 0–32)
AST: 14 IU/L (ref 0–40)
Albumin: 4.2 g/dL (ref 4.0–5.0)
Alkaline Phosphatase: 69 IU/L (ref 41–116)
BUN/Creatinine Ratio: 13 (ref 9–23)
BUN: 9 mg/dL (ref 6–20)
Bilirubin Total: 0.5 mg/dL (ref 0.0–1.2)
CO2: 23 mmol/L (ref 20–29)
Calcium: 9.3 mg/dL (ref 8.7–10.2)
Chloride: 105 mmol/L (ref 96–106)
Creatinine, Ser: 0.71 mg/dL (ref 0.57–1.00)
Globulin, Total: 2.4 g/dL (ref 1.5–4.5)
Glucose: 81 mg/dL (ref 70–99)
Potassium: 4.2 mmol/L (ref 3.5–5.2)
Sodium: 141 mmol/L (ref 134–144)
Total Protein: 6.6 g/dL (ref 6.0–8.5)
eGFR: 118 mL/min/1.73 (ref >59)

## 2024-07-21 LAB — ENA+DNA/DS+ANTICH+CENTRO+FA...
Anti JO-1: <0.2 AI (ref 0.0–0.9)
CENTROMERE AB: 1:320 {titer} — ABNORMAL HIGH
Centromere Ab Screen: 0.9 AI (ref 0.0–0.9)
Chromatin Ab SerPl-aCnc: <0.2 AI (ref 0.0–0.9)
ENA RNP Ab: 0.3 AI (ref 0.0–0.9)
ENA SM Ab Ser-aCnc: <0.2 AI (ref 0.0–0.9)
ENA SSB (LA) Ab: <0.2 AI (ref 0.0–0.9)
Homogeneous Pattern: 1:80 {titer}
Scleroderma (Scl-70) (ENA) Antibody, IgG: <0.2 AI (ref 0.0–0.9)

## 2024-07-21 LAB — SJOGRENS SYNDROME-A EXTRACTABLE NUCLEAR ANTIBODY: ENA SSA (RO) Ab: <0.2 AI (ref 0.0–0.9)

## 2024-07-21 LAB — C3 AND C4
Complement C3, Serum: 101 mg/dL (ref 82–167)
Complement C4, Serum: 20 mg/dL (ref 12–38)

## 2024-07-21 LAB — ANA: ANA Titer 1: POSITIVE — AB

## 2024-07-24 ENCOUNTER — Other Ambulatory Visit (HOSPITAL_COMMUNITY): Payer: Self-pay

## 2024-07-24 DIAGNOSIS — F330 Major depressive disorder, recurrent, mild: Secondary | ICD-10-CM | POA: Diagnosis not present

## 2024-07-24 DIAGNOSIS — F908 Attention-deficit hyperactivity disorder, other type: Secondary | ICD-10-CM | POA: Diagnosis not present

## 2024-07-24 DIAGNOSIS — F5105 Insomnia due to other mental disorder: Secondary | ICD-10-CM | POA: Diagnosis not present

## 2024-07-24 DIAGNOSIS — F411 Generalized anxiety disorder: Secondary | ICD-10-CM | POA: Diagnosis not present

## 2024-07-24 MED ORDER — DULOXETINE HCL 60 MG PO CPEP
60.0000 mg | ORAL_CAPSULE | Freq: Every day | ORAL | 0 refills | Status: DC
  Filled 2024-07-24: qty 90, 90d supply, fill #0

## 2024-07-24 MED ORDER — FLUOXETINE HCL 40 MG PO CAPS
40.0000 mg | ORAL_CAPSULE | Freq: Every day | ORAL | 0 refills | Status: AC
  Filled 2024-07-24: qty 90, 90d supply, fill #0

## 2024-07-24 MED ORDER — AMPHETAMINE-DEXTROAMPHET ER 30 MG PO CP24
30.0000 mg | ORAL_CAPSULE | Freq: Every day | ORAL | 0 refills | Status: AC
  Filled 2024-07-24: qty 90, 90d supply, fill #0

## 2024-07-25 ENCOUNTER — Ambulatory Visit: Payer: Self-pay | Admitting: Rheumatology

## 2024-07-25 NOTE — Progress Notes (Signed)
 Thank you for forwarding labs.  We will notify patient of the lab results.  Her ANA remains positive, ENA panel negative, complements normal, CBC and CMP normal, UA normal.  Labs do not indicate an autoimmune disease flare.

## 2024-08-02 ENCOUNTER — Ambulatory Visit: Admitting: Family Medicine

## 2024-08-02 VITALS — BP 102/60 | HR 73 | Ht 63.0 in | Wt 191.6 lb

## 2024-08-02 DIAGNOSIS — M25562 Pain in left knee: Secondary | ICD-10-CM

## 2024-08-02 DIAGNOSIS — G8929 Other chronic pain: Secondary | ICD-10-CM

## 2024-08-02 DIAGNOSIS — Q7962 Hypermobile Ehlers-Danlos syndrome: Secondary | ICD-10-CM

## 2024-08-02 DIAGNOSIS — M25362 Other instability, left knee: Secondary | ICD-10-CM

## 2024-08-02 NOTE — Patient Instructions (Addendum)
 Thank you for coming in today.  You should hear from MRI scheduling within 1 week. If you do not hear please let me know.

## 2024-08-02 NOTE — Progress Notes (Signed)
   I, Claretha Schimke am a scribe for Dr. Artist Lloyd, MD.  [name] is a 28 y.o. female who presents to [facility] today for f/u hEDS and LBP. Pt was last seen by Dr. Lloyd on 06/08/24 and was advised to cont Cymbalta , knee bracing, and working w/ [facility]. Also advised to f/u w/ Dr. Lazarus.  Today, pt reports that the back is doing ok today. Some improvements with the back but having more problems with the left knee. It has been swelling and painful with and without activity. Activity makes the pain worse. Pain in the knee does keep her from sleeping from time to time.   She notes recently she had a left knee subluxation event and worsening pain.  This is despite bracing and Kinesiotape.  Pertinent review of systems: No fevers or chills  Relevant historical information: Gestational diabetes history.  Hypermobile Ehlers-Danlos syndrome.   Exam:  BP 102/60   Pulse 73   Ht 5' 3 (1.6 m)   Wt 191 lb 9.6 oz (86.9 kg)   SpO2 97%   BMI 33.94 kg/m  General: Well Developed, well nourished, and in no acute distress.   MSK: Left knee normal-appearing Mildly tender to palpation. Normal motion with retropatellar crepitation. Laxity to anterior drawer testing and to lateral and medial stability testing.  Mildly positive patellar apprehension test.     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  Plan to continue PT but plan for MRI left knee.  Anticipate recheck after MRI.  If surgery obviously indicated okay to refer to our orthopedic surgery. PDMP not reviewed this encounter. Orders Placed This Encounter  Procedures   MR Knee Left  Wo Contrast     [employer]([place]) Epic Wt: 190 Ht: 5'3  No Needs/No claus/No metal removed from eyes/No bullets or bbs/No implants/No brain, heart, eyes or ears sx/No left knee sx/No iron fusion therapy Aware to bring photo ID and ins card  Aware of $75 no show fee  Spoke w pt 08/02/2024 EB    Standing Status:   Future    Expiration Date:   08/02/2025    What is the patient's sedation requirement?:   No Sedation    Does the patient have a pacemaker or implanted devices?:   No    Preferred imaging location?:   [facility] [facility] (table limit-550lbs)   No orders of the defined types were placed in this encounter.    Discussed warning signs or symptoms. Please see discharge instructions. Patient expresses understanding.   The above documentation has been reviewed and is accurate and complete Artist Lloyd, M.D.

## 2024-08-10 ENCOUNTER — Ambulatory Visit
Admission: RE | Admit: 2024-08-10 | Discharge: 2024-08-10 | Disposition: A | Discharge status: Home / Self Care | Source: Ambulatory Visit | Attending: Family Medicine | Admitting: Family Medicine

## 2024-08-10 DIAGNOSIS — R600 Localized edema: Secondary | ICD-10-CM | POA: Diagnosis not present

## 2024-08-10 DIAGNOSIS — M25362 Other instability, left knee: Secondary | ICD-10-CM

## 2024-08-10 DIAGNOSIS — M25562 Pain in left knee: Secondary | ICD-10-CM | POA: Diagnosis not present

## 2024-08-10 DIAGNOSIS — G8929 Other chronic pain: Secondary | ICD-10-CM

## 2024-08-11 ENCOUNTER — Ambulatory Visit: Payer: Self-pay | Admitting: Family Medicine

## 2024-08-11 NOTE — Progress Notes (Signed)
 Left knee MRI shows some arthritis underneath the kneecap and a bipartite patella.  Meniscus looks okay. The radiologist did not comment on medial patellofemoral ligament or the Tibial tuberosity to trochlear groove distance (TT-TG distance). I will asked the radiologist to addend the MRI report and make a comment on both of these structures which are relevant for surgery planning. I looked myself and it looks like the medial patellofemoral ligament is intact but the distance between the 2 structures is a little more than it should be.

## 2024-08-16 DIAGNOSIS — M542 Cervicalgia: Secondary | ICD-10-CM | POA: Diagnosis not present

## 2024-08-16 DIAGNOSIS — M357 Hypermobility syndrome: Secondary | ICD-10-CM | POA: Diagnosis not present

## 2024-08-16 DIAGNOSIS — M25562 Pain in left knee: Secondary | ICD-10-CM | POA: Diagnosis not present

## 2024-08-16 DIAGNOSIS — M6281 Muscle weakness (generalized): Secondary | ICD-10-CM | POA: Diagnosis not present

## 2024-08-16 DIAGNOSIS — M546 Pain in thoracic spine: Secondary | ICD-10-CM | POA: Diagnosis not present

## 2024-08-16 DIAGNOSIS — M5459 Other low back pain: Secondary | ICD-10-CM | POA: Diagnosis not present

## 2024-08-22 ENCOUNTER — Telehealth (HOSPITAL_COMMUNITY): Payer: Self-pay

## 2024-08-22 ENCOUNTER — Other Ambulatory Visit (HOSPITAL_COMMUNITY): Payer: Self-pay

## 2024-08-22 ENCOUNTER — Encounter: Payer: Self-pay | Admitting: Student in an Organized Health Care Education/Training Program

## 2024-08-22 ENCOUNTER — Ambulatory Visit
Attending: Student in an Organized Health Care Education/Training Program | Admitting: Student in an Organized Health Care Education/Training Program

## 2024-08-22 VITALS — BP 109/60 | HR 53 | Temp 97.3°F | Resp 16 | Ht 63.0 in | Wt 185.0 lb

## 2024-08-22 DIAGNOSIS — Q7962 Hypermobile Ehlers-Danlos syndrome: Secondary | ICD-10-CM | POA: Insufficient documentation

## 2024-08-22 DIAGNOSIS — G894 Chronic pain syndrome: Secondary | ICD-10-CM | POA: Insufficient documentation

## 2024-08-22 DIAGNOSIS — G90A Postural orthostatic tachycardia syndrome (POTS): Secondary | ICD-10-CM | POA: Diagnosis not present

## 2024-08-22 MED ORDER — BUPRENORPHINE 7.5 MCG/HR TD PTWK
1.0000 | MEDICATED_PATCH | TRANSDERMAL | 0 refills | Status: DC
  Filled 2024-08-22: qty 4, 28d supply, fill #0

## 2024-08-22 MED ORDER — BUPRENORPHINE 5 MCG/HR TD PTWK
1.0000 | MEDICATED_PATCH | TRANSDERMAL | 0 refills | Status: DC
  Filled 2024-08-22 – 2024-08-30 (×2): qty 4, 28d supply, fill #0

## 2024-08-22 NOTE — Telephone Encounter (Signed)
 Pharmacy Patient Advocate Encounter   Received notification from Pt Calls Messages that prior authorization for Buprenorphine 5MCG/HR weekly patches  is required/requested.   Insurance verification completed.   The patient is insured through [facility] [employer].   Per test claim: PA required; PA submitted to above mentioned insurance via Latent Key/confirmation #/EOC [id number] Status is pending

## 2024-08-22 NOTE — Progress Notes (Signed)
 PROVIDER NOTE: Interpretation of information contained herein should be left to medically-trained personnel. Specific patient instructions are provided elsewhere under Patient Instructions section of medical record. This document was created in part using AI and STT-dictation technology, any transcriptional errors that may result from this process are unintentional.  Patient: [name]  Service: E/M   PCP: Towana Small, FNP  DOB: Jul 07, 1996  DOS: 08/22/2024  Provider: Wallie Sherry, MD  MRN: [id number]  Delivery: Face-to-face  Specialty: Interventional Pain Management  Type: Established Patient  Setting: Ambulatory outpatient facility  Specialty designation: 09  Referring Prov.: Towana Small, FNP  Location: Outpatient office facility       History of present illness (HPI) [name], a 28 y.o. 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 [name] primary complain today is Back Pain (lower)  Pertinent problems: [name] does not have any pertinent problems on file.  Pain Assessment: Severity of Chronic pain is reported as a 3 /10. Location: Back Lower/denies. Onset: More than a month ago. Quality: Sharp. Timing: Intermittent. Modifying factor(s): Ibuprofen , Tylenol . Vitals:  height is 5' 3 (1.6 m) and weight is 185 lb (83.9 kg). Her temporal temperature is 97.3 F (36.3 C) (abnormal). Her blood pressure is 109/60 and her pulse is 53 (abnormal). Her respiration is 16 and oxygen saturation is 100%.  BMI: Estimated body mass index is 32.77 kg/m as calculated from the following:   Height as of this encounter: 5' 3 (1.6 m).   Weight as of this encounter: 185 lb (83.9 kg).  Last encounter: 12/16/2023. Last procedure: 11/10/2023.  Reason for encounter:   History of Present Illness   [name] is a 28 year old female with Ehlers-Danlos syndrome and POTS who presents with chronic pain management.  She was diagnosed  with Ehlers-Danlos syndrome and POTS in October 2025 after undergoing screening and extensive blood work. She is currently under the care of a sports medicine doctor and a rheumatologist for these conditions.  She experiences ongoing pain in her lower back and knee. An MRI of the knee was recently performed. The pain in her back and down her leg has remained about the same since her last MRI in 2024. She previously underwent an L5 S1 injection, which provided only temporary relief and was accompanied by a vasovagal episode.  She is currently taking Cymbalta  and has previously tried Lyrica  and gabapentin  for pain management. Gabapentin  was used short-term when her symptoms first began. She experiences pain on a daily basis, with the pain being worse on the left side.       No results found for: D9THCCBX  ROS  Constitutional: Denies any fever or chills Gastrointestinal: No reported hemesis, hematochezia, vomiting, or acute GI distress Musculoskeletal: Diffuse musculoskeletal pain most pronounced in the low back legs Neurological: No reported episodes of acute onset apraxia, aphasia, dysarthria, agnosia, amnesia, paralysis, loss of coordination, or loss of consciousness  Medication Review  DULoxetine , FLUoxetine , Sodium Fluoride , amphetamine -dextroamphetamine , buprenorphine, pilocarpine , promethazine , and simethicone   History Review  Allergy: [name] is allergic to hydrocodone , sertraline, shellfish protein-containing drug products, aspirin, pollen extract, and mold extract [trichophyton mentagrophyte]. Drug: [name]  reports no history of drug use. Alcohol:  reports current alcohol use of about 1.0 standard drink of alcohol per week. Tobacco:  reports that she has never smoked. She has never been exposed to tobacco smoke. She has never used smokeless tobacco. Social: [name]  reports that she has  never smoked. She has never been exposed to tobacco smoke. She has never used smokeless  tobacco. She reports current alcohol use of about 1.0 standard drink of alcohol per week. She reports that she does not use drugs. Medical:  has a past medical history of Allergy, Anxiety, Depression, Endometriosis, Gestational diabetes, Neuromuscular disorder (HCC), Otitis media, and Seizures (HCC). Surgical: [name]  has a past surgical history that includes Cesarean section (2014); Cholecystectomy; Tonsillectomy; laparoscopy (2012); and Cesarean section (N/A, 03/04/2018). Family: family history includes ADD / ADHD in her brother, brother, and father; Alcohol abuse in her maternal grandmother; Anxiety disorder in her brother; Arthritis in her mother; Autism in her brother; Breast cancer in her mother; Cancer in her maternal grandmother and mother; Cirrhosis in her mother; Depression in her brother and mother; Diabetes in her mother; Healthy in her brother, brother, son, and son; Heart disease in her brother and mother; Learning disabilities in her brother; Lupus in her mother; Obesity in her brother and mother; Other in her mother; Ovarian cancer in her mother; Rheum arthritis in her mother; Sjogren's syndrome in her mother; Uterine cancer in her mother.  Laboratory Chemistry Profile   Renal Lab Results  Component Value Date   BUN 9 07/19/2024   CREATININE 0.71 07/19/2024   LABCREA 42 02/01/2024   BCR 13 07/19/2024   GFRAA 148 05/01/2020   GFRNONAA 129 05/01/2020    Hepatic Lab Results  Component Value Date   AST 14 07/19/2024   ALT 14 07/19/2024   ALBUMIN 4.2 07/19/2024   ALKPHOS 69 07/19/2024   LIPASE 143 09/19/2013    Electrolytes Lab Results  Component Value Date   NA 141 07/19/2024   K 4.2 07/19/2024   CL 105 07/19/2024   CALCIUM 9.3 07/19/2024   PHOS 3.7 01/16/2020    Bone Lab Results  Component Value Date   VD25OH 41 02/01/2024    Inflammation (CRP: Acute Phase) (ESR: Chronic Phase) Lab Results  Component Value Date   ESRSEDRATE 17 07/19/2024         Note:  Above Lab results reviewed.  Recent Imaging Review  MR Knee Left  Wo Contrast EXAM DESCRIPTION: MR KNEE LEFT WO CONTRAST  CLINICAL HISTORY: Knee pain, chronic, positive xray (Age >= 5y); Patellar instability  COMPARISON: None Available.  TECHNIQUE: MRI of the knee is performed according to our usual protocol with multiplanar multi sequence imaging.  FINDINGS: The anterior cruciate ligament and posterior cruciate ligament are intact. The medial collateral ligament and lateral collateral ligament are intact. The menisci are unremarkable. There is mild to moderate patellofemoral compartment chondromalacia. Mild degenerative edema to the lower pole. Small bipartite patella.  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 Mild degenerative edema to the lower pole. Also a small bipartite patella.  The menisci and ligaments are unremarkable.  Electronically signed by: Reyes Frees MD 08/10/2024 06:41 PM EDT RP Workstation: [id number] Note: Reviewed        Physical Exam  Vitals: BP 109/60 (Cuff Size: Normal)   Pulse (!) 53   Temp (!) 97.3 F (36.3 C) (Temporal)   Resp 16   Ht 5' 3 (1.6 m)   Wt 185 lb (83.9 kg)   SpO2 100%   BMI 32.77 kg/m  BMI: Estimated body mass index is 32.77 kg/m as calculated from the following:   Height as of this encounter: 5' 3 (1.6 m).   Weight as of this encounter: 185 lb (83.9 kg). Ideal: Ideal body weight: 52.4 kg (115 lb  8.3 oz) Adjusted ideal body weight: 65 kg (143 lb 5 oz) General appearance: Well nourished, well developed, and well hydrated. In no apparent acute distress Mental status: Alert, oriented x 3 (person, place, & time)       Respiratory: No evidence of acute respiratory distress Eyes: PERLA   Assessment   Diagnosis  1. Hypermobile Ehlers-Danlos syndrome   2. POTS (postural orthostatic tachycardia syndrome)   3. Chronic pain syndrome      Updated Problems: No problems updated.  Plan of  Care  Problem-specific:  Assessment and Plan    Chronic pain syndrome associated with hypermobile Ehlers-Danlos syndrome and postural orthostatic tachycardia syndrome   Chronic pain is likely exacerbated by hypermobile Ehlers-Danlos syndrome and POTS. Previous epidural injections provided temporary relief but caused vasovagal episodes. Current management includes Cymbalta  and Prozac , with caution for serotonin syndrome. Gabapentin  at a low dose is a potential option. A buprenorphine patch is considered for chronic pain management due to its lower habit-forming potential and lack of serotonin interactions. Start buprenorphine patch at 5 micrograms for the first month, increasing to 7.5 micrograms for the second month. She was educated on patch application sites and care, including avoiding heating pads and allowing showering. A follow-up is scheduled in two months to assess tolerance and effectiveness.  Left lower back and leg pain with mild-moderate lumbar disc protrusion   Left lower back and leg pain is associated with a mild-moderate lumbar disc protrusion. An MRI from 2024 shows a tiny central disc protrusion near the descending S1 nerve root, more pronounced on the left, with mild neuroforaminal narrowing. A previous L5-S1 injection was not beneficial and caused complications, so further spinal injections are not recommended.  Chondromalacia of knee   Chondromalacia of the knee is present with moderate disease noted on MRI. Pain may be more related to knee issues than the lower back.Her knee condition will continue to be monitored, and pain will be managed as needed.       [name] has a current medication list which includes the following long-term medication(s): amphetamine -dextroamphetamine , amphetamine -dextroamphetamine , duloxetine , duloxetine , [DISCONTINUED] promethazine , and [DISCONTINUED] simethicone .  Pharmacotherapy (Medications Ordered): Meds ordered this encounter   Medications   buprenorphine (BUTRANS) 5 MCG/HR PTWK    Sig: Place 1 patch onto the skin once a week for 28 days.    Dispense:  4 patch    Refill:  0   buprenorphine (BUTRANS) 7.5 MCG/HR    Sig: Place 1 patch onto the skin once a week for 28 days.    Dispense:  4 patch    Refill:  0   Orders:  No orders of the defined types were placed in this encounter.    L5-S1 ESI 11/10/2023   Return in about 8 weeks (around 10/17/2024) for MM, F2F.    Recent Visits No visits were found meeting these conditions. Showing recent visits within past 90 days and meeting all other requirements Today's Visits Date Type Provider Dept  08/22/24 Office Visit Marcelino Nurse, MD [facility]  Showing today's visits and meeting all other requirements Future Appointments Date Type Provider Dept  10/24/24 Appointment Marcelino Nurse, MD [facility]  Showing future appointments within next 90 days and meeting all other requirements  I discussed the assessment and treatment plan with the patient. The patient was provided an opportunity to ask questions and all were answered. The patient agreed with the plan and demonstrated an understanding of the instructions.  Patient advised to call  back or seek an in-person evaluation if the symptoms or condition worsens.  I personally spent a total of 30 minutes in the care of the patient today including preparing to see the patient, getting/reviewing separately obtained history, performing a medically appropriate exam/evaluation, counseling and educating, placing orders, and documenting clinical information in the EHR.   Note by: Wallie Sherry, MD (TTS and AI technology used. I apologize for any typographical errors that were not detected and corrected.) Date: 08/22/2024; Time: 9:36 AM

## 2024-08-22 NOTE — Telephone Encounter (Signed)
 PA request has been Received. New Encounter has been or will be created for follow up. For additional info see Pharmacy Prior Auth telephone encounter from 08/22/24.

## 2024-08-23 ENCOUNTER — Other Ambulatory Visit (HOSPITAL_COMMUNITY): Payer: Self-pay

## 2024-08-25 ENCOUNTER — Other Ambulatory Visit (HOSPITAL_COMMUNITY): Payer: Self-pay

## 2024-08-25 NOTE — Telephone Encounter (Signed)
 Additional information has been requested from the patient's insurance in order to proceed with the prior authorization request. Requested information has been sent, or form has been filled out and faxed back to [phone]

## 2024-08-28 DIAGNOSIS — M25562 Pain in left knee: Secondary | ICD-10-CM | POA: Diagnosis not present

## 2024-08-28 DIAGNOSIS — M542 Cervicalgia: Secondary | ICD-10-CM | POA: Diagnosis not present

## 2024-08-28 DIAGNOSIS — M5459 Other low back pain: Secondary | ICD-10-CM | POA: Diagnosis not present

## 2024-08-28 DIAGNOSIS — M357 Hypermobility syndrome: Secondary | ICD-10-CM | POA: Diagnosis not present

## 2024-08-28 DIAGNOSIS — M6281 Muscle weakness (generalized): Secondary | ICD-10-CM | POA: Diagnosis not present

## 2024-08-28 DIAGNOSIS — M546 Pain in thoracic spine: Secondary | ICD-10-CM | POA: Diagnosis not present

## 2024-08-29 DIAGNOSIS — Z01419 Encounter for gynecological examination (general) (routine) without abnormal findings: Secondary | ICD-10-CM | POA: Diagnosis not present

## 2024-08-29 DIAGNOSIS — Z124 Encounter for screening for malignant neoplasm of cervix: Secondary | ICD-10-CM | POA: Diagnosis not present

## 2024-08-29 DIAGNOSIS — Z113 Encounter for screening for infections with a predominantly sexual mode of transmission: Secondary | ICD-10-CM | POA: Diagnosis not present

## 2024-08-29 DIAGNOSIS — Z01411 Encounter for gynecological examination (general) (routine) with abnormal findings: Secondary | ICD-10-CM | POA: Diagnosis not present

## 2024-08-29 DIAGNOSIS — Z1331 Encounter for screening for depression: Secondary | ICD-10-CM | POA: Diagnosis not present

## 2024-08-29 DIAGNOSIS — Z30431 Encounter for routine checking of intrauterine contraceptive device: Secondary | ICD-10-CM | POA: Diagnosis not present

## 2024-08-30 ENCOUNTER — Other Ambulatory Visit (HOSPITAL_COMMUNITY): Payer: Self-pay

## 2024-08-31 NOTE — Telephone Encounter (Signed)
 Pharmacy Patient Advocate Encounter  Received notification from [facility] that Prior Authorization for Buprenorphine 5MCG/HR weekly patches  has been DENIED.  See denial reason below. No denial letter attached in CMM. Will attach denial letter to Media tab once received.   PA #/Case ID/Reference #: [id number]

## 2024-09-06 DIAGNOSIS — M6281 Muscle weakness (generalized): Secondary | ICD-10-CM | POA: Diagnosis not present

## 2024-09-06 DIAGNOSIS — M357 Hypermobility syndrome: Secondary | ICD-10-CM | POA: Diagnosis not present

## 2024-09-06 DIAGNOSIS — M546 Pain in thoracic spine: Secondary | ICD-10-CM | POA: Diagnosis not present

## 2024-09-06 DIAGNOSIS — M25562 Pain in left knee: Secondary | ICD-10-CM | POA: Diagnosis not present

## 2024-09-06 DIAGNOSIS — M542 Cervicalgia: Secondary | ICD-10-CM | POA: Diagnosis not present

## 2024-09-06 DIAGNOSIS — M5459 Other low back pain: Secondary | ICD-10-CM | POA: Diagnosis not present

## 2024-09-12 ENCOUNTER — Other Ambulatory Visit: Payer: Self-pay

## 2024-09-12 ENCOUNTER — Ambulatory Visit: Admitting: Family Medicine

## 2024-09-12 VITALS — BP 106/74 | HR 65 | Ht 63.0 in | Wt 193.0 lb

## 2024-09-12 DIAGNOSIS — G8929 Other chronic pain: Secondary | ICD-10-CM | POA: Diagnosis not present

## 2024-09-12 DIAGNOSIS — M25562 Pain in left knee: Secondary | ICD-10-CM | POA: Diagnosis not present

## 2024-09-12 NOTE — Patient Instructions (Signed)
 Thank you for coming in today.   You received an injection today. Seek immediate medical attention if the joint becomes red, extremely painful, or is oozing fluid.

## 2024-09-12 NOTE — Progress Notes (Signed)
 LILLETTE Ileana Collet, PhD, LAT, ATC acting as a scribe for Artist Lloyd, MD.  [name] is a 28 y.o. female who presents to [facility] today for f/u L knee pain in the setting of hEDS w/ MRI review. Pt was last seen by Dr. Lloyd on 08/02/24 and was advised to cont PT and MRI was ordered.  Today, pt reports L knee pain is about the same. It's been hurting a lot and swelling.  He also has been quite a while since her last knee injection.  Previous steroid injection only lasted for about a week and a half.  Dx testing: 08/10/24 L knee MRI  02/01/24 L knee XR  Pertinent review of systems: No fevers or chills  Relevant historical information: Occipital neuralgia and hypermobile EDS.   Exam:  BP 106/74   Pulse 65   Ht 5' 3 (1.6 m)   Wt 193 lb (87.5 kg)   SpO2 97%   BMI 34.19 kg/m  General: Well Developed, well nourished, and in no acute distress.   MSK: Left knee minimal effusion normal motion.  Crepitations with range of motion.   Radiology  Procedure: Real-time Ultrasound Guided Injection of left knee joint superior lateral patella space Device: Philips Affiniti 50G/GE Logiq Images permanently stored and available for review in PACS Verbal informed consent obtained.  Discussed risks and benefits of procedure. Warned about infection, bleeding, hyperglycemia damage to structures among others. Patient expresses understanding and agreement Time-out conducted.   Noted no overlying erythema, induration, or other signs of local infection.   Skin prepped in a sterile fashion.   Local anesthesia: Topical Ethyl chloride.   With sterile technique and under real time ultrasound guidance: 40 mg of Kenalog  and 2 mL of Marcaine  injected into knee joint. Fluid seen entering the joint capsule.   Completed without difficulty   Pain immediately resolved suggesting accurate placement of the medication.   Advised to call if fevers/chills, erythema,  induration, drainage, or persistent bleeding.   Images permanently stored and available for review in the ultrasound unit.  Impression: Technically successful ultrasound guided injection.   EXAM DESCRIPTION: MR KNEE LEFT WO CONTRAST   CLINICAL HISTORY: Knee pain, chronic, positive xray (Age >= 5y); Patellar instability   COMPARISON: None Available.   TECHNIQUE: MRI of the knee is performed according to our usual protocol with multiplanar multi sequence imaging.   FINDINGS: The anterior cruciate ligament and posterior cruciate ligament are intact. The medial collateral ligament and lateral collateral ligament are intact. The menisci are unremarkable. There is mild to moderate patellofemoral compartment chondromalacia. Mild degenerative edema to the lower pole. Small bipartite patella.   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 Mild degenerative edema to the lower pole. Also a small bipartite patella.   The menisci and ligaments are unremarkable.     Electronically signed by: Reyes Frees MD 08/10/2024 06:41 PM EDT RP Workstation: [id number] LILLETTE Artist Lloyd, personally (independently) visualized and performed the interpretation of the images attached in this note.      Assessment and Plan: 28 y.o. female with left knee pain due to patellofemoral chondromalacia and hypermobility.  Plan for trial of steroid injection.  If not improved consider Zilretta  or hyaluronic acid injection.  After discussion would consider single dose gel shot versus Zilretta  as first-line treatment if basic cortisone shot does not last.  Continue PT.   PDMP not reviewed this encounter. Orders Placed This Encounter  Procedures   US  LIMITED  JOINT SPACE STRUCTURES LOW LEFT(NO LINKED CHARGES)    Reason for Exam (SYMPTOM  OR DIAGNOSIS REQUIRED):   left knee pain    Preferred imaging location?:   [facility]   No orders of the defined types were  placed in this encounter.    Discussed warning signs or symptoms. Please see discharge instructions. Patient expresses understanding.   The above documentation has been reviewed and is accurate and complete Artist Lloyd, M.D.

## 2024-09-13 DIAGNOSIS — M542 Cervicalgia: Secondary | ICD-10-CM | POA: Diagnosis not present

## 2024-09-13 DIAGNOSIS — M357 Hypermobility syndrome: Secondary | ICD-10-CM | POA: Diagnosis not present

## 2024-09-13 DIAGNOSIS — M5459 Other low back pain: Secondary | ICD-10-CM | POA: Diagnosis not present

## 2024-09-13 DIAGNOSIS — M25562 Pain in left knee: Secondary | ICD-10-CM | POA: Diagnosis not present

## 2024-09-13 DIAGNOSIS — M546 Pain in thoracic spine: Secondary | ICD-10-CM | POA: Diagnosis not present

## 2024-09-13 DIAGNOSIS — M6281 Muscle weakness (generalized): Secondary | ICD-10-CM | POA: Diagnosis not present

## 2024-09-21 DIAGNOSIS — M5459 Other low back pain: Secondary | ICD-10-CM | POA: Diagnosis not present

## 2024-09-21 DIAGNOSIS — M25562 Pain in left knee: Secondary | ICD-10-CM | POA: Diagnosis not present

## 2024-09-21 DIAGNOSIS — M357 Hypermobility syndrome: Secondary | ICD-10-CM | POA: Diagnosis not present

## 2024-09-21 DIAGNOSIS — M546 Pain in thoracic spine: Secondary | ICD-10-CM | POA: Diagnosis not present

## 2024-09-21 DIAGNOSIS — M6281 Muscle weakness (generalized): Secondary | ICD-10-CM | POA: Diagnosis not present

## 2024-09-21 DIAGNOSIS — M542 Cervicalgia: Secondary | ICD-10-CM | POA: Diagnosis not present

## 2024-10-23 ENCOUNTER — Other Ambulatory Visit: Payer: Self-pay

## 2024-10-23 ENCOUNTER — Other Ambulatory Visit (HOSPITAL_COMMUNITY): Payer: Self-pay

## 2024-10-23 MED ORDER — FLUOXETINE HCL 40 MG PO CAPS
40.0000 mg | ORAL_CAPSULE | Freq: Every day | ORAL | 0 refills | Status: AC
  Filled 2024-10-23: qty 90, 90d supply, fill #0

## 2024-10-23 MED ORDER — AMPHETAMINE-DEXTROAMPHET ER 30 MG PO CP24: 30.0000 mg | ORAL_CAPSULE | Freq: Every day | ORAL | 0 refills | Status: AC

## 2024-10-23 MED ORDER — DULOXETINE HCL 60 MG PO CPEP
60.0000 mg | ORAL_CAPSULE | Freq: Every day | ORAL | 0 refills | Status: AC
  Filled 2024-10-23: qty 90, 90d supply, fill #0

## 2024-10-24 ENCOUNTER — Other Ambulatory Visit (HOSPITAL_COMMUNITY): Payer: Self-pay

## 2024-10-24 ENCOUNTER — Encounter: Payer: Self-pay | Admitting: Student in an Organized Health Care Education/Training Program

## 2024-10-24 ENCOUNTER — Ambulatory Visit
Payer: Self-pay | Attending: Student in an Organized Health Care Education/Training Program | Admitting: Student in an Organized Health Care Education/Training Program

## 2024-10-24 VITALS — BP 105/61 | HR 53 | Temp 97.6°F | Resp 18 | Ht 63.0 in | Wt 185.0 lb

## 2024-10-24 DIAGNOSIS — M5134 Other intervertebral disc degeneration, thoracic region: Secondary | ICD-10-CM | POA: Diagnosis not present

## 2024-10-24 DIAGNOSIS — G894 Chronic pain syndrome: Secondary | ICD-10-CM | POA: Diagnosis not present

## 2024-10-24 DIAGNOSIS — M47894 Other spondylosis, thoracic region: Secondary | ICD-10-CM | POA: Insufficient documentation

## 2024-10-24 DIAGNOSIS — G90A Postural orthostatic tachycardia syndrome (POTS): Secondary | ICD-10-CM | POA: Insufficient documentation

## 2024-10-24 DIAGNOSIS — Q7962 Hypermobile Ehlers-Danlos syndrome: Secondary | ICD-10-CM | POA: Diagnosis not present

## 2024-10-24 MED ORDER — TRAMADOL HCL 50 MG PO TABS
50.0000 mg | ORAL_TABLET | Freq: Two times a day (BID) | ORAL | 0 refills | Status: AC | PRN
  Filled 2024-10-24: qty 30, 15d supply, fill #0

## 2024-10-24 NOTE — Progress Notes (Signed)
 PROVIDER NOTE: Interpretation of information contained herein should be left to medically-trained personnel. Specific patient instructions are provided elsewhere under Patient Instructions section of medical record. This document was created in part using AI and STT-dictation technology, any transcriptional errors that may result from this process are unintentional.  Patient: [name]  Service: E/M   PCP: Towana Small, FNP  DOB: 08-Oct-1996  DOS: 10/24/2024  Provider: Wallie Sherry, MD  MRN: [id number]  Delivery: Face-to-face  Specialty: Interventional Pain Management  Type: Established Patient  Setting: Ambulatory outpatient facility  Specialty designation: 09  Referring Prov.: Towana Small, FNP  Location: Outpatient office facility       History of present illness (HPI) [name], a 29 y.o. year old female, is here today because of her Thoracic facet joint syndrome [M47.894]. [name] primary complain today is Back Pain (Upper/), Shoulder Pain, Wrist Pain (Right ), and Neck Pain  Pertinent problems: [name] does not have any pertinent problems on file.  Pain Assessment: Severity of Chronic pain is reported as a 3 /10. Location: Back Upper, Right, Left/Radiates from upper back bilateal into shoulder bilatetral having numbess in wrists more so in right wrist. Pain also radaites to neck and behind the head. Onset: More than a month ago. Quality: [place], Aching. Timing: Intermittent. Modifying factor(s): Denies. Vitals:  height is 5' 3 (1.6 m) and weight is 185 lb (83.9 kg). Her temporal temperature is 97.6 F (36.4 C). Her blood pressure is 105/61 and her pulse is 53 (abnormal). Her respiration is 18 and oxygen saturation is 100%.  BMI: Estimated body mass index is 32.77 kg/m as calculated from the following:   Height as of this encounter: 5' 3 (1.6 m).   Weight as of this encounter: 185 lb (83.9 kg).  Last encounter: 08/22/2024. Last procedure:  11/10/2023.  Reason for encounter:   Discussed the use of AI scribe software for clinical note transcription with the patient, who gave verbal consent to proceed.  History of Present Illness   [name] is a 29 year old female who presents with upper back and shoulder pain.  She has been experiencing upper back and shoulder pain, particularly in the shoulder blades area, for the past one to two weeks. The pain has started radiating down into her arm, and she experiences numbness that seems to coincide with her shoulder pain. The pain is more pronounced on the right side. No pain radiating from the neck into the shoulders or hands.  An MRI of the cervical spine performed in April 2024 showed no significant findings. However, a previous MRI of the thoracic spine revealed small disc herniations and mild to moderate disc degeneration at T6-T7.  She has previously received an epidural injection for her back pain, which was ineffective. She has tried tramadol  for pain management, which she tolerates better than gabapentin  and finds it provides relief during flare-ups.   She has a history of knee rehabilitation and received an injection in her left knee in early December, which helped alleviate her knee issues. She has not reported any recent trauma that could have exacerbated her back pain.       Pharmacotherapy Assessment  Analgesic: Tramadol  50 mg as needed for breakthrough pain  Monitoring: [place] PMP: PDMP reviewed during this encounter.         UDS: Collected today    ROS  Constitutional: Denies any fever or chills Gastrointestinal: No reported hemesis, hematochezia, vomiting, or acute GI distress Musculoskeletal: Midthoracic  pain Neurological: No reported episodes of acute onset apraxia, aphasia, dysarthria, agnosia, amnesia, paralysis, loss of coordination, or loss of consciousness  Medication Review  DULoxetine , FLUoxetine , Sodium Fluoride , amphetamine -dextroamphetamine ,  pilocarpine , promethazine , simethicone , and traMADol   History Review  Allergy: [name] is allergic to hydrocodone , sertraline, shellfish protein-containing drug products, aspirin, pollen extract, and mold extract [trichophyton mentagrophyte]. Drug: [name]  reports no history of drug use. Alcohol:  reports current alcohol use of about 1.0 standard drink of alcohol per week. Tobacco:  reports that she has never smoked. She has never been exposed to tobacco smoke. She has never used smokeless tobacco. Social: [name]  reports that she has never smoked. She has never been exposed to tobacco smoke. She has never used smokeless tobacco. She reports current alcohol use of about 1.0 standard drink of alcohol per week. She reports that she does not use drugs. Medical:  has a past medical history of Allergy, Anxiety, Depression, Endometriosis, Gestational diabetes, Neuromuscular disorder (HCC), Otitis media, and Seizures (HCC). Surgical: [name]  has a past surgical history that includes Cesarean section (2014); Cholecystectomy; Tonsillectomy; laparoscopy (2012); and Cesarean section (N/A, 03/04/2018). Family: family history includes ADD / ADHD in her brother, brother, and father; Alcohol abuse in her maternal grandmother; Anxiety disorder in her brother; Arthritis in her mother; Autism in her brother; Breast cancer in her mother; Cancer in her maternal grandmother and mother; Cirrhosis in her mother; Depression in her brother and mother; Diabetes in her mother; Healthy in her brother, brother, son, and son; Heart disease in her brother and mother; Learning disabilities in her brother; Lupus in her mother; Obesity in her brother and mother; Other in her mother; Ovarian cancer in her mother; Rheum arthritis in her mother; Sjogren's syndrome in her mother; Uterine cancer in her mother.  Laboratory Chemistry Profile   Renal Lab Results  Component Value Date   BUN 9 07/19/2024   CREATININE 0.71  07/19/2024   LABCREA 42 02/01/2024   BCR 13 07/19/2024   GFRAA 148 05/01/2020   GFRNONAA 129 05/01/2020    Hepatic Lab Results  Component Value Date   AST 14 07/19/2024   ALT 14 07/19/2024   ALBUMIN 4.2 07/19/2024   ALKPHOS 69 07/19/2024   LIPASE 143 09/19/2013    Electrolytes Lab Results  Component Value Date   NA 141 07/19/2024   K 4.2 07/19/2024   CL 105 07/19/2024   CALCIUM 9.3 07/19/2024   PHOS 3.7 01/16/2020    Bone Lab Results  Component Value Date   VD25OH 41 02/01/2024    Inflammation (CRP: Acute Phase) (ESR: Chronic Phase) Lab Results  Component Value Date   ESRSEDRATE 17 07/19/2024         Note: Above Lab results reviewed.  Recent Imaging Review  MR Knee Left  Wo Contrast EXAM DESCRIPTION: MR KNEE LEFT WO CONTRAST  CLINICAL HISTORY: Knee pain, chronic, positive xray (Age >= 5y); Patellar instability  COMPARISON: None Available.  TECHNIQUE: MRI of the knee is performed according to our usual protocol with multiplanar multi sequence imaging.  FINDINGS: The anterior cruciate ligament and posterior cruciate ligament are intact. The medial collateral ligament and lateral collateral ligament are intact. The menisci are unremarkable. There is mild to moderate patellofemoral compartment chondromalacia. Mild degenerative edema to the lower pole. Small bipartite patella.  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 Mild degenerative edema to the lower pole. Also a small bipartite patella.  The menisci and ligaments are unremarkable.  Electronically signed  by: Reyes Frees MD 08/10/2024 06:41 PM EDT RP Workstation: [id number]  CLINICAL DATA:  Provided history: Thoracic spondylosis. Mid back pain, spondyloarthropathy suspected, x-ray done. Additional history provided by the scanning technologist: The patient reports upper back pain for 10-12 months.   EXAM: MRI THORACIC SPINE WITHOUT CONTRAST    TECHNIQUE: Multiplanar, multisequence MR imaging of the thoracic spine was performed. No intravenous contrast was administered.   COMPARISON:  Thoracic spine radiographs 03/18/2023.   FINDINGS: Alignment: Mild dextrocurvature of the thoracic spine. Slight T11-T12 grade 1 retrolisthesis.   Vertebrae: Small multilevel Schmorl nodes within the mid and lower thoracic spine. No significant marrow edema or focal worrisome marrow lesion. Small multilevel ventral osteophytes.   Cord: No signal abnormality identified within the thoracic spinal cord.   Paraspinal and other soft tissues: No acute finding within included portions of the thorax or upper abdomen/retroperitoneum. No paraspinal mass or collection.   Disc levels:   Multilevel disc degeneration. Most notably, mild-to-moderate disc degeneration is present at the T6-T7 and T7-T8 levels.   Additional notable level by level findings as described below.   At T5-T6, there is a small central disc protrusion which focally effaces the ventral thecal sac and slightly flattens the ventral aspect of the spinal cord. The dorsal CSF space is maintained within the spinal canal.   At T6-T7, there is a small central disc protrusion which focally effaces the ventral thecal sac and slightly flattens the ventral aspect of the spinal cord. The dorsal CSF space is maintained within the spinal canal.   At T7-T8, there is a small central disc protrusion which focally effaces the ventral thecal sac and mildly flattens the ventral aspect of the spinal cord. The dorsal CSF space is maintained within the spinal canal.   At T8-T9, there is a tiny central disc protrusion without significant spinal canal stenosis.   At T9-T10, there is a tiny central disc protrusion without significant spinal canal stenosis.   At T11-T12, there is slight grade 1 retrolisthesis. Tiny central disc protrusion (at site of posterior annular fissure). The  disc protrusion minimally effaces the ventral thecal sac.   No significant foraminal stenosis within the thoracic spine.   IMPRESSION: 1. Thoracic spondylosis as outlined and with findings most notably as follows. 2. Small central disc protrusions at T5-T6, T6-T7, T7-T8, T8-T9, T9-T10 and T11-T12. The disc protrusions at T5-T6, T6-T7 and T7-T8 mildly flatten the ventral aspect of the spinal cord. However, the dorsal CSF space is maintained within the spinal canal at these levels. 3. Disc degeneration is greatest at T6-T7 and T7-T8 (mild-to-moderate at these levels). 4. Small multilevel Schmorl nodes within the mid and lower thoracic spine. 5. Mild dextrocurvature of the thoracic spine.     Electronically Signed   By: Rockey Childs D.O.   On: 08/04/2023 19:38        Note: Reviewed        Physical Exam  Vitals: BP 105/61 (Patient Position: Sitting, Cuff Size: Normal)   Pulse (!) 53   Temp 97.6 F (36.4 C) (Temporal)   Resp 18   Ht 5' 3 (1.6 m)   Wt 185 lb (83.9 kg)   SpO2 100%   BMI 32.77 kg/m  BMI: Estimated body mass index is 32.77 kg/m as calculated from the following:   Height as of this encounter: 5' 3 (1.6 m).   Weight as of this encounter: 185 lb (83.9 kg). Ideal: Ideal body weight: 52.4 kg (115 lb 8.3 oz) Adjusted ideal body  weight: 65 kg (143 lb 5 oz) General appearance: Well nourished, well developed, and well hydrated. In no apparent acute distress Mental status: Alert, oriented x 3 (person, place, & time)       Respiratory: No evidence of acute respiratory distress Eyes: PERLA Midthoracic pain overlying T6, T7, T8 tender to palpation  Assessment   Diagnosis  1. Thoracic facet joint syndrome   2. Degeneration of thoracic intervertebral disc   3. Hypermobile Ehlers-Danlos syndrome   4. POTS (postural orthostatic tachycardia syndrome)   5. Chronic pain syndrome      Updated Problems: Problem  Thoracic Facet Joint Syndrome  Pots (Postural  Orthostatic Tachycardia Syndrome)  Chronic Pain Syndrome  Degeneration of Thoracic Intervertebral Disc    Plan of Care  Problem-specific:  Assessment and Plan    Thoracic disc degeneration with facet arthropathy   Chronic thoracic disc degeneration at T6-T8 with facet arthropathy primarily affects the right side, causing pain localized to the shoulder blades. This pain is likely due to degenerative changes rather than disc herniations. Previous epidural injections were ineffective. MRI shows mild to moderate disc degeneration at T7-T8 with Modic changes indicating disc desiccation. A diagnostic thoracic medial branch nerve block targeting T7 and T8 with fluoroscopic mapping will be performed to correlate with pain location. Valium will be administered for sedation during the procedure, which is scheduled for approximately one week from now.  Chronic pain syndrome   Prescription for tramadol  to help manage breakthrough pain.  Patient previous buprenorphine  prescription was not approved.  Discussed limiting opioid use.      [name] has a current medication list which includes the following long-term medication(s): amphetamine -dextroamphetamine , duloxetine , amphetamine -dextroamphetamine , amphetamine -dextroamphetamine , [DISCONTINUED] promethazine , and [DISCONTINUED] simethicone .  Pharmacotherapy (Medications Ordered): Meds ordered this encounter  Medications   traMADol  (ULTRAM ) 50 MG tablet    Sig: Take 1 tablet (50 mg total) by mouth every 12 (twelve) hours as needed for severe pain (pain score 7-10).    Dispense:  30 tablet    Refill:  0    Fill one day early if pharmacy is closed on scheduled refill date.   Orders:  Orders Placed This Encounter  Procedures   THORACIC FACET BLOCK    Scheduling Instructions:     Thoracic Medial Branch Block     Side: Bilateral     Level(s): T6, T7, T8     Sedation: PO Valium 10 mg     Timeframe: Today    Where will this procedure be  performed?:   [facility]   Compliance Drug Analysis, Ur    Volume: 30 ml(s). Minimum 3 ml of urine is needed. Document temperature of fresh sample. Indications: Long term (current) use of opiate analgesic (Z79.891) Test#: [phone] (Comprehensive Profile)    Release to patient:   Immediate     L5-S1 ESI 11/10/2023   Return in about 1 week (around 10/31/2024) for B/L T6, 7, 8 MBNB , in clinic IV Versed.    Recent Visits Date Type Provider Dept  08/22/24 Office Visit Marcelino Nurse, MD [facility]  Showing recent visits within past 90 days and meeting all other requirements Today's Visits Date Type Provider Dept  10/24/24 Office Visit Marcelino Nurse, MD [facility]  Showing today's visits and meeting all other requirements Future Appointments Date Type Provider Dept  11/01/24 Appointment Marcelino Nurse, MD [facility]  Showing future appointments within next 90 days and meeting all other requirements  I discussed the assessment  and treatment plan with the patient. The patient was provided an opportunity to ask questions and all were answered. The patient agreed with the plan and demonstrated an understanding of the instructions.  Patient advised to call back or seek an in-person evaluation if the symptoms or condition worsens.  I personally spent a total of 30 minutes in the care of the patient today including preparing to see the patient, getting/reviewing separately obtained history, performing a medically appropriate exam/evaluation, counseling and educating, placing orders, and documenting clinical information in the EHR.   Note by: Wallie Sherry, MD (TTS and AI technology used. I apologize for any typographical errors that were not detected and corrected.) Date: 10/24/2024; Time: 11:27 AM

## 2024-10-24 NOTE — Progress Notes (Signed)
 Safety precautions to be maintained throughout the outpatient stay will include: orient to surroundings, keep bed in low position, maintain call bell within reach at all times, provide assistance with transfer out of bed and ambulation.

## 2024-10-24 NOTE — Patient Instructions (Signed)
 ______________________________________________________________________    Preparing for your procedure  Appointments: If you think you may not be able to keep your appointment, call 24-48 hours in advance to cancel. We need time to make it available to others.  Procedure visits are for procedures only. During your procedure appointment there will be: NO Prescription Refills*. NO medication changes or discussions*. NO discussion of disability issues*. NO unrelated pain problem evaluations*. NO evaluations to order other pain procedures*. *These will be addressed at a separate and distinct evaluation encounter on the provider's evaluation schedule and not during procedure days.  Instructions: Food intake: Avoid eating anything solid for at least 8 hours prior to your procedure. Clear liquid intake: You may take clear liquids such as water up to 2 hours prior to your procedure. (No carbonated drinks. No soda.) Transportation: Unless otherwise stated by your physician, bring a driver. (Driver cannot be a Market researcher, Pharmacist, community, or any other form of public transportation.) Morning Medicines: Except for blood thinners, take all of your other morning medications with a sip of water. Make sure to take your heart and blood pressure medicines. If your blood pressure's lower number is above 100, the case will be rescheduled. Blood thinners: Make sure to stop your blood thinners as instructed.  If you take a blood thinner, but were not instructed to stop it, call our office [phone] and ask to talk to a nurse. Not stopping a blood thinner prior to certain procedures could lead to serious complications. Diabetics on insulin: Notify the staff so that you can be scheduled 1st case in the morning. If your diabetes requires high dose insulin, take only  of your normal insulin dose the morning of the procedure and notify the staff that you have done so. Preventing infections: Shower with an antibacterial soap the  morning of your procedure.  Build-up your immune system: Take 1000 mg of Vitamin C with every meal (3 times a day) the day prior to your procedure. Antibiotics: Inform the nursing staff if you are taking any antibiotics or if you have any conditions that may require antibiotics prior to procedures. (Example: recent joint implants)   Pregnancy: If you are pregnant make sure to notify the nursing staff. Not doing so may result in injury to the fetus, including death.  Sickness: If you have a cold, fever, or any active infections, call and cancel or reschedule your procedure. Receiving steroids while having an infection may result in complications. Arrival: You must be in the facility at least 30 minutes prior to your scheduled procedure. Tardiness: Your scheduled time is also the cutoff time. If you do not arrive at least 15 minutes prior to your procedure, you will be rescheduled.  Children: Do not bring any children with you. Make arrangements to keep them home. Dress appropriately: There is always a possibility that your clothing may get soiled. Avoid long dresses. Valuables: Do not bring any jewelry or valuables.  Reasons to call and reschedule or cancel your procedure: (Following these recommendations will minimize the risk of a serious complication.) Surgeries: Avoid having procedures within 2 weeks of any surgery. (Avoid for 2 weeks before or after any surgery). Flu Shots: Avoid having procedures within 2 weeks of a flu shots or . (Avoid for 2 weeks before or after immunizations). Barium: Avoid having a procedure within 7-10 days after having had a radiological study involving the use of radiological contrast. (Myelograms, Barium swallow or enema study). Heart attacks: Avoid any elective procedures or surgeries for the  initial 6 months after a "Myocardial Infarction" (Heart Attack). Blood thinners: It is imperative that you stop these medications before procedures. Let us know if you if you take  any blood thinner.  Infection: Avoid procedures during or within two weeks of an infection (including chest colds or gastrointestinal problems). Symptoms associated with infections include: Localized redness, fever, chills, night sweats or profuse sweating, burning sensation when voiding, cough, congestion, stuffiness, runny nose, sore throat, diarrhea, nausea, vomiting, cold or Flu symptoms, recent or current infections. It is specially important if the infection is over the area that we intend to treat. Heart and lung problems: Symptoms that may suggest an active cardiopulmonary problem include: cough, chest pain, breathing difficulties or shortness of breath, dizziness, ankle swelling, uncontrolled high or unusually low blood pressure, and/or palpitations. If you are experiencing any of these symptoms, cancel your procedure and contact your primary care physician for an evaluation.  Remember:  Regular Business hours are:  Monday to Thursday 8:00 AM to 4:00 PM  Provider's Schedule: Delano Metz, MD:  Procedure days: Tuesday and Thursday 7:30 AM to 4:00 PM  Edward Jolly, MD:  Procedure days: Monday and Wednesday 7:30 AM to 4:00 PM Last  Updated: 09/21/2023 ______________________________________________________________________    Facet Joint Block The facet joints connect the bones of the spine (vertebrae). They let you bend, twist, and make other movements with your spine. They also keep you from bending too far, twisting too far, and making other extreme movements. A facet joint block is a procedure where a numbing medicine (local anesthesia) is injected into a facet joint. Many times, a medicine for inflammation (steroid) is also injected. A facet joint block may be done: To diagnose neck or back pain. If the pain gets better after a facet joint block, the pain is likely coming from the facet joint. If the pain does not get better, the pain is likely not coming from the facet joint. To  treat neck or back pain caused by an inflamed facet joint. To help you with physical therapy or other rehab (rehabilitation) exercises. Tell a health care provider about: Any allergies you have. All medicines you are taking, including vitamins, herbs, eye drops, creams, and over-the-counter medicines. Any problems you or family members have had with anesthesia. Any bleeding problems you have. Any surgeries you have had. Any medical conditions you have or have had. Whether you are pregnant or may be pregnant. What are the risks? Your health care provider will talk with you about risks. These may include: Infection. Allergic reactions to medicines or dyes. Bleeding. Injury to a nerve near where the needle was put in (injection site). Pain at the injection site. Short-term weakness or numbness in areas near the nerves at the injection site. What happens before the procedure? When to stop eating and drinking Follow instructions from your health care provider about what you may eat and drink. Medicines Ask your health care provider about: Changing or stopping your regular medicines. These include any diabetes medicines or blood thinners you take. Taking medicines such as aspirin and ibuprofen. These medicines can thin your blood. Do not take these medicines unless your health care provider tells you to. Taking over-the-counter medicines, vitamins, herbs, and supplements. General instructions If you will be going home right after the procedure, plan to have a responsible adult: Take you home from the hospital or clinic. You will not be allowed to drive. Care for you for the time you are told. Ask your health care provider:  How your injection site will be marked. What steps will be taken to help prevent infection. These may include washing skin with a soap that kills germs. What happens during the procedure?  An IV will be inserted into one of your veins. You will lie on your stomach on  an X-ray table. You may be asked to lie in a different position if you will be getting an injection in your neck. Your injection site will be cleaned with a soap that kills germs and then covered with a germ-free (sterile) drape. A local anesthesia will be put in at the injection site. A type of X-ray machine (fluoroscopy) or CT scan will be used to help find your facet joint. A contrast dye may also be injected into your joint to help show if the needle is at the joint. When your provider knows the needle is at your joint, they will inject anesthesia and anti-inflammatory medicine as needed. The needle will be removed. Pressure will be applied to keep your injection site from bleeding. A bandage (dressing) will be placed over each injection site. The procedure may vary among health care providers and hospitals. What happens after the procedure? Your blood pressure, heart rate, breathing rate, and blood oxygen level will be monitored until you leave the hospital or clinic. This information is not intended to replace advice given to you by your health care provider. Make sure you discuss any questions you have with your health care provider. Document Revised: 04/10/2022 Document Reviewed: 04/10/2022 [employer] Patient Education  2024 [employer].

## 2024-10-27 ENCOUNTER — Other Ambulatory Visit (HOSPITAL_COMMUNITY): Payer: Self-pay

## 2024-11-01 ENCOUNTER — Ambulatory Visit (HOSPITAL_BASED_OUTPATIENT_CLINIC_OR_DEPARTMENT_OTHER): Admitting: Student in an Organized Health Care Education/Training Program

## 2024-11-01 ENCOUNTER — Ambulatory Visit
Admission: RE | Admit: 2024-11-01 | Discharge: 2024-11-01 | Disposition: A | Discharge status: Home / Self Care | Source: Ambulatory Visit | Attending: Student in an Organized Health Care Education/Training Program | Admitting: Student in an Organized Health Care Education/Training Program

## 2024-11-01 ENCOUNTER — Encounter: Payer: Self-pay | Admitting: Student in an Organized Health Care Education/Training Program

## 2024-11-01 VITALS — BP 118/64 | HR 80 | Temp 97.7°F | Resp 16 | Ht 63.0 in | Wt 185.0 lb

## 2024-11-01 DIAGNOSIS — G894 Chronic pain syndrome: Secondary | ICD-10-CM | POA: Insufficient documentation

## 2024-11-01 DIAGNOSIS — M47894 Other spondylosis, thoracic region: Secondary | ICD-10-CM

## 2024-11-01 DIAGNOSIS — M5134 Other intervertebral disc degeneration, thoracic region: Secondary | ICD-10-CM | POA: Insufficient documentation

## 2024-11-01 MED ORDER — LIDOCAINE HCL 2 % IJ SOLN
20.0000 mL | Freq: Once | INTRAMUSCULAR | Status: AC
  Administered 2024-11-01: 400 mg
  Filled 2024-11-01: qty 20

## 2024-11-01 MED ORDER — DEXAMETHASONE SOD PHOSPHATE PF 10 MG/ML IJ SOLN
20.0000 mg | Freq: Once | INTRAMUSCULAR | Status: AC
  Administered 2024-11-01: 20 mg
  Filled 2024-11-01: qty 2

## 2024-11-01 MED ORDER — MIDAZOLAM HCL (PF) 2 MG/2ML IJ SOLN
0.5000 mg | Freq: Once | INTRAMUSCULAR | Status: AC
  Administered 2024-11-01: 2 mg via INTRAVENOUS
  Filled 2024-11-01: qty 2

## 2024-11-01 MED ORDER — GLYCOPYRROLATE 0.2 MG/ML IJ SOLN
INTRAMUSCULAR | Status: AC
  Filled 2024-11-01: qty 1

## 2024-11-01 MED ORDER — LACTATED RINGERS IV SOLN: Freq: Once | INTRAVENOUS | Status: AC

## 2024-11-01 MED ORDER — ROPIVACAINE HCL 2 MG/ML IJ SOLN
18.0000 mL | Freq: Once | INTRAMUSCULAR | Status: AC
  Administered 2024-11-01: 18 mL via PERINEURAL
  Filled 2024-11-01: qty 20

## 2024-11-01 NOTE — Progress Notes (Signed)
 PROVIDER NOTE: Interpretation of information contained herein should be left to medically-trained personnel. Specific patient instructions are provided elsewhere under Patient Instructions section of medical record. This document was created in part using STT-dictation technology, any transcriptional errors that may result from this process are unintentional.  Patient: [name] Type: Established DOB: 1996-05-29 MRN: [id number] PCP: Towana Small, FNP  Service: Procedure DOS: 11/01/2024 Setting: Ambulatory Location: Ambulatory outpatient facility Delivery: Face-to-face Provider: Wallie Sherry, MD Specialty: Interventional Pain Management Specialty designation: 09 Location: Outpatient facility Ref. Prov.: Towana Small, FNP       Interventional Therapy   Procedure: Thoracic facet medial branch block #1  Laterality: Bilateral (-50)  Level: T6, T7, and T8 Medial Branch Level(s)  Imaging: Fluoroscopy-guided Spinal ([id number]) Anesthesia: Local anesthesia (1-2% Lidocaine ) Anxiolysis: 2 mg IV Versed  DOS: 11/01/2024  Performed by: Wallie Sherry, MD  Purpose: Diagnostic/Therapeutic Indications: Thoracic back pain severe enough to impact quality of life or function. Rationale (medical necessity): procedure needed and proper for the diagnosis and/or treatment of [name]'s medical symptoms and needs. 1. Thoracic facet joint syndrome   2. Degeneration of thoracic intervertebral disc   3. Chronic pain syndrome    NAS-11 Pain score:   Pre-procedure: 4 /10   Post-procedure: 0-No pain/10     Target: Thoracic posterior (dorsal) primary rami nerve Location: 2.5 cm lateral to midline (spinous process), just cephalad to upper transverse process edge.  (needle tip placement) Region: Thoracic  Approach: Paravertebral  Type of procedure: Percutaneous perineural nerve block   Pertinent Anatomy: There are two potential fascial compartments in the TPVS: the anterior extrapleural  paravertebral compartment and the posterior subendothoracic paravertebral compartment. The transverse process and the superior costotransverse ligament form the posterior boundary.  Position / Prep / Materials:  Position: Prone  Prep solution: ChloraPrep (2% chlorhexidine gluconate and 70% isopropyl alcohol) Prep Area: Entire upper back region Materials:  Tray: Block Needle(s):  Type: Spinal  Gauge (G): 22  Length: 3.5-in  Qty: 3  H&P (Pre-op Assessment):  [name] is a 29 y.o. (year old), female patient, seen today for interventional treatment. She  has a past surgical history that includes Cesarean section (2014); Cholecystectomy; Tonsillectomy; laparoscopy (2012); and Cesarean section (N/A, 03/04/2018). [name] has a current medication list which includes the following prescription(s): amphetamine -dextroamphetamine , amphetamine -dextroamphetamine , amphetamine -dextroamphetamine , duloxetine , fluoxetine , fluoxetine , fluoxetine , pilocarpine , prevident  5000 booster plus, tramadol , [DISCONTINUED] promethazine , and [DISCONTINUED] simethicone . Her primarily concern today is the Back Pain (Bilateral mid )  Initial Vital Signs:  Pulse/HCG Rate: 80ECG Heart Rate: (!) 42 Temp: 97.9 F (36.6 C) Resp: 16 BP: 116/74 SpO2: 99 %  BMI: Estimated body mass index is 32.77 kg/m as calculated from the following:   Height as of this encounter: 5' 3 (1.6 m).   Weight as of this encounter: 185 lb (83.9 kg).  Risk Assessment: Allergies: Reviewed. She is allergic to hydrocodone , sertraline, shellfish protein-containing drug products, aspirin, pollen extract, and mold extract [trichophyton mentagrophyte].  Allergy Precautions: None required Coagulopathies: Reviewed. None identified.  Blood-thinner therapy: None at this time Active Infection(s): Reviewed. None identified. [name] is afebrile  Site Confirmation: [name] was asked to confirm the procedure and laterality before marking the  site Procedure checklist: Completed Consent: Before the procedure and under the influence of no sedative(s), amnesic(s), or anxiolytics, the patient was informed of the treatment options, risks and possible complications. To fulfill our ethical and legal obligations, as recommended by the American Medical Association's Code of Ethics, I have informed the patient of  my clinical impression; the nature and purpose of the treatment or procedure; the risks, benefits, and possible complications of the intervention; the alternatives, including doing nothing; the risk(s) and benefit(s) of the alternative treatment(s) or procedure(s); and the risk(s) and benefit(s) of doing nothing. The patient was provided information about the general risks and possible complications associated with the procedure. These may include, but are not limited to: failure to achieve desired goals, infection, bleeding, organ or nerve damage, allergic reactions, paralysis, and death. In addition, the patient was informed of those risks and complications associated to Spine-related procedures, such as failure to decrease pain; infection (i.e.: Meningitis, epidural or intraspinal abscess); bleeding (i.e.: epidural hematoma, subarachnoid hemorrhage, or any other type of intraspinal or peri-dural bleeding); organ or nerve damage (i.e.: Any type of peripheral nerve, nerve root, or spinal cord injury) with subsequent damage to sensory, motor, and/or autonomic systems, resulting in permanent pain, numbness, and/or weakness of one or several areas of the body; allergic reactions; (i.e.: anaphylactic reaction); and/or death. Furthermore, the patient was informed of those risks and complications associated with the medications. These include, but are not limited to: allergic reactions (i.e.: anaphylactic or anaphylactoid reaction(s)); adrenal axis suppression; blood sugar elevation that in diabetics may result in ketoacidosis or comma; water retention  that in patients with history of congestive heart failure may result in shortness of breath, pulmonary edema, and decompensation with resultant heart failure; weight gain; swelling or edema; medication-induced neural toxicity; particulate matter embolism and blood vessel occlusion with resultant organ, and/or nervous system infarction; and/or aseptic necrosis of one or more joints. Finally, the patient was informed that Medicine is not an exact science; therefore, there is also the possibility of unforeseen or unpredictable risks and/or possible complications that may result in a catastrophic outcome. The patient indicated having understood very clearly. We have given the patient no guarantees and we have made no promises. Enough time was given to the patient to ask questions, all of which were answered to the patient's satisfaction. Ms. Haroon has indicated that she wanted to continue with the procedure. Attestation: I, the ordering provider, attest that I have discussed with the patient the benefits, risks, side-effects, alternatives, likelihood of achieving goals, and potential problems during recovery for the procedure that I have provided informed consent. Date  Time: 11/01/2024 11:40 AM  Pre-Procedure Preparation:  Monitoring: As per clinic protocol. Respiration, ETCO2, SpO2, BP, heart rate and rhythm monitor placed and checked for adequate function Safety Precautions: Patient was assessed for positional comfort and pressure points before starting the procedure. Time-out: I initiated and conducted the Time-out before starting the procedure, as per protocol. The patient was asked to participate by confirming the accuracy of the Time Out information. Verification of the correct person, site, and procedure were performed and confirmed by me, the nursing staff, and the patient. Time-out conducted as per Joint Commission's Universal Protocol (UP.01.01.01). Time: 1200 Start Time: 1200  hrs.  Description/Narrative of Procedure:          Start Time: 1200 hrs. Technical description of procedure:  Rationale (medical necessity): procedure needed and proper for the diagnosis and/or treatment of the patient's medical symptoms and needs. Procedural Technique Safety Precautions: Aspiration looking for blood return was conducted prior to all injections. At no point did we inject any substances, as a needle was being advanced. No attempts were made at seeking any paresthesias. Safe injection practices and needle disposal techniques used. Medications properly checked for expiration dates. SDV (single dose vial) medications  used. Target Area: For the thoracic medial branch nerve, the target is located between the top of the transverse processes at the point where the transverse process joints the vertebra and the superior-lateral aspect of the transverse process.  (More lateral towards the superior aspect of the thoracic spine.) For safety reasons and to minimize the risk of hemo- or pneumothorax, the needle tip was not advanced past the boundary of the posterior paravertebral compartment (superior costotransverse ligament). Description of the Procedure: Protocol guidelines were followed. The patient was placed in position over the fluoroscopy table. The target area was identified and the area prepped in the usual manner. Skin & deeper tissues infiltrated with local anesthetic. Appropriate amount of time allowed to pass for local anesthetics to take effect. The procedure needles were then advanced to the target area. Proper needle placement secured. Negative aspiration confirmed. Solution injected in intermittent fashion, asking for systemic symptoms every 0.5cc of injectate. The needles were then removed and the area cleansed, making sure to leave some of the prepping solution back to take advantage of its long term bactericidal properties.  2 cc of nerve block solution injected at each level  bilaterally at T6, T7, T8             Facet Joint Innervation  T1-2 C8, T1  Medial Branch  T2-3 T1, T2                     T3-4 T2, T3                     T4-5 T3, T4                     T5-6 T4, T5                     T6-7 T5, T6                     T7-8 T6, T7                     T8-9 T7, T8                     T9-10 T8, T9                     T10-11 T9, T10                     T11-12 T10, T11                     T12-L1 T11, T12                       Vitals:   11/01/24 1201 11/01/24 1206 11/01/24 1211 11/01/24 1215  BP: 116/74 110/69 108/69 118/64  Pulse:      Resp: 15 15 16 16   Temp:    97.7 F (36.5 C)  TempSrc:    Temporal  SpO2: 100% 100% 100% 98%  Weight:      Height:         End Time: 1210 hrs.  Imaging Guidance (Spinal):         Type of Imaging Technique: Fluoroscopy Guidance (Spinal) Indication(s): Fluoroscopy guidance for needle placement to enhance accuracy in procedures requiring precise needle localization for targeted delivery of medication in or near specific anatomical locations not easily  accessible without such real-time imaging assistance. Exposure Time: Please see nurses notes. Contrast: None used. Fluoroscopic Guidance: I was personally present during the use of fluoroscopy. Tunnel Vision Technique used to obtain the best possible view of the target area. Parallax error corrected before commencing the procedure. Direction-depth-direction technique used to introduce the needle under continuous pulsed fluoroscopy. Once target was reached, antero-posterior, oblique, and lateral fluoroscopic projection used confirm needle placement in all planes. Images permanently stored in EMR. Interpretation: No contrast injected. I personally interpreted the imaging intraoperatively. Adequate needle placement confirmed in multiple planes. Permanent images saved into the patient's record.  Post-operative Assessment:  Post-procedure Vital  Signs:  Pulse/HCG Rate: 8070 Temp: 97.7 F (36.5 C) Resp: 16 BP: 118/64 SpO2: 98 %  EBL: None  Complications: No immediate post-treatment complications observed by team, or reported by patient.  Note: Of note, patient had a vasovagal response where she became bradycardic.  0.2 mg of IV glycopyrrolate  was given with improvement in vital signs and status.  A repeat set of vitals were taken after the procedure and the patient was kept under observation following institutional policy, for this type of procedure. Post-procedural neurological assessment was performed, showing return to baseline, prior to discharge. The patient was provided with post-procedure discharge instructions, including a section on how to identify potential problems. Should any problems arise concerning this procedure, the patient was given instructions to immediately contact us , at any time, without hesitation. In any case, we plan to contact the patient by telephone for a follow-up status report regarding this interventional procedure.  Comments:  No additional relevant information.  Plan of Care (POC)  Orders:  Orders Placed This Encounter  Procedures   DG PAIN [facility] C-ARM 1-60 MIN NO REPORT    Intraoperative interpretation by procedural physician at [facility].    Standing Status:   Standing    Number of Occurrences:   1    Reason for exam::   Assistance in needle guidance and placement for procedures requiring needle placement in or near specific anatomical locations not easily accessible without such assistance.     Medications ordered for procedure: Meds ordered this encounter  Medications   lidocaine  (XYLOCAINE ) 2 % (with pres) injection 400 mg   lactated ringers  infusion   midazolam  PF (VERSED ) injection 0.5-2 mg    Make sure Flumazenil is available in the pyxis when using this medication. If oversedation occurs, administer 0.2 mg IV over 15 sec. If after 45 sec no response, administer 0.2 mg  again over 1 min; may repeat at 1 min intervals; not to exceed 4 doses (1 mg)   ropivacaine  (PF) 2 mg/mL (0.2%) (NAROPIN ) injection 18 mL   dexamethasone  (DECADRON ) injection 20 mg   Medications administered: We administered lidocaine , lactated ringers , midazolam  PF, ropivacaine  (PF) 2 mg/mL (0.2%), and dexamethasone .  See the medical record for exact dosing, route, and time of administration.    L5-S1 ESI 11/10/2023, B/L T6,7,8 MBNB #1 11/01/24    Follow-up plan:   Return in about 3 weeks (around 11/22/2024) for PPE, VV. BL.     Recent Visits Date Type Provider Dept  10/24/24 Office Visit Marcelino Nurse, MD [facility]  08/22/24 Office Visit Marcelino Nurse, MD [facility]  Showing recent visits within past 90 days and meeting all other requirements Today's Visits Date Type Provider Dept  11/01/24 Procedure visit Marcelino Nurse, MD [facility]  Showing today's visits and meeting all other requirements Future Appointments Date Type Provider  Dept  11/22/24 Appointment Marcelino Nurse, MD [facility]  Showing future appointments within next 90 days and meeting all other requirements   Disposition: Discharge home  Discharge (Date  Time): 11/01/2024; 1224 hrs.   Primary Care Physician: Towana Small, FNP Location: [facility] Note by: Nurse Marcelino, MD (TTS technology used. I apologize for any typographical errors that were not detected and corrected.) Date: 11/01/2024; Time: 1:19 PM  Disclaimer:  Medicine is not an visual merchandiser. The only guarantee in medicine is that nothing is guaranteed. It is important to note that the decision to proceed with this intervention was based on the information collected from the patient. The Data and conclusions were drawn from the patient's questionnaire, the interview, and the physical examination. Because the information was provided in large part by the patient, it cannot be  guaranteed that it has not been purposely or unconsciously manipulated. Every effort has been made to obtain as much relevant data as possible for this evaluation. It is important to note that the conclusions that lead to this procedure are derived in large part from the available data. Always take into account that the treatment will also be dependent on availability of resources and existing treatment guidelines, considered by other Pain Management Practitioners as being common knowledge and practice, at the time of the intervention. For Medico-Legal purposes, it is also important to point out that variation in procedural techniques and pharmacological choices are the acceptable norm. The indications, contraindications, technique, and results of the above procedure should only be interpreted and judged by a Board-Certified Interventional Pain Specialist with extensive familiarity and expertise in the same exact procedure and technique.

## 2024-11-01 NOTE — Progress Notes (Signed)
 Safety precautions to be maintained throughout the outpatient stay will include: orient to surroundings, keep bed in low position, maintain call bell within reach at all times, provide assistance with transfer out of bed and ambulation.

## 2024-11-01 NOTE — Patient Instructions (Signed)

## 2024-11-02 ENCOUNTER — Telehealth: Payer: Self-pay | Admitting: *Deleted

## 2024-11-02 NOTE — Telephone Encounter (Signed)
 Post procedure call:   no  questions or concerns.

## 2024-11-10 LAB — COMPLIANCE DRUG ANALYSIS, UR

## 2024-11-16 NOTE — Progress Notes (Unsigned)
 "  Office Visit Note  Patient: [name]             Date of Birth: 1996-03-26           MRN: [id number]             PCP: Towana Small, FNP Referring: Towana Small, FNP Visit Date: 11/30/2024 Occupation: Data Unavailable  Subjective:  No chief complaint on file.   History of Present Illness: [name] is a 29 y.o. female ***     Activities of Daily Living:  Patient reports morning stiffness for *** {minute/hour:19697}.   Patient {ACTIONS;DENIES/REPORTS:21021675::Denies} nocturnal pain.  Difficulty dressing/grooming: {ACTIONS;DENIES/REPORTS:21021675::Denies} Difficulty climbing stairs: {ACTIONS;DENIES/REPORTS:21021675::Denies} Difficulty getting out of chair: {ACTIONS;DENIES/REPORTS:21021675::Denies} Difficulty using hands for taps, buttons, cutlery, and/or writing: {ACTIONS;DENIES/REPORTS:21021675::Denies}  No Rheumatology ROS completed.   PMFS History:  Patient Active Problem List   Diagnosis Date Noted   Thoracic facet joint syndrome 10/24/2024   POTS (postural orthostatic tachycardia syndrome) 10/24/2024   Chronic pain syndrome 10/24/2024   Hypermobile Ehlers-Danlos syndrome 06/08/2024   Lumbar radiculopathy 12/16/2023   Carpal tunnel syndrome on right 10/26/2023   Degeneration of thoracic intervertebral disc 08/02/2023   Bilateral occipital neuralgia 03/18/2023   Chronic bilateral low back pain without sciatica 03/18/2023   Degenerative cervical disc 03/18/2023   Pain in right ankle and joints of right foot 01/18/2023   Pain in right foot 01/18/2023   Sprain of right ankle 01/18/2023   Sprain of right foot 01/18/2023   Disorder of rotator cuff, right 01/11/2023   Right cervical radiculopathy 01/11/2023   Anxiety 05/12/2021   Chronic depression 05/07/2021   Endometriosis, unspecified 05/07/2021   Epilepsy (HCC) 05/07/2021   Family history of neoplasm of breast 05/07/2021   Genital herpes simplex 05/07/2021   Family history of  neoplasm of ovary 05/07/2021   History of thrombocytopenia 05/07/2021   Closed fracture of patella 10/13/2018   Cesarean delivery delivered - Repeat 5/24 03/04/2018   Postpartum care following vaginal delivery 03/04/2018   Seizure-like activity (HCC) 11/30/2017   Diet controlled gestational diabetes mellitus (GDM) in second trimester 10/28/2017   Impaired glucose tolerance test 09/23/2017   Chronic pelvic pain in female 02/01/2012    Past Medical History:  Diagnosis Date   Allergy    Anxiety    on medication   Depression    on medication   Endometriosis    Gestational diabetes    diet controlled   Neuromuscular disorder (HCC)    Ehlers-Danlos Syndrome   Otitis media    Seizures (HCC)     Family History  Problem Relation Age of Onset   Diabetes Mother    Lupus Mother        diagnosed age 65   Sjogren's syndrome Mother        diagnosed age 59   Cirrhosis Mother        stage 1   Uterine cancer Mother    Ovarian cancer Mother    Other Mother        breast mass   Rheum arthritis Mother        diagnosed age 53   Breast cancer Mother    Arthritis Mother    Cancer Mother    Depression Mother    Heart disease Mother    Obesity Mother    ADD / ADHD Father    ADD / ADHD Brother    Autism Brother        borderline   Healthy Brother  Healthy Brother    Healthy Son    Healthy Son    Alcohol abuse Maternal Grandmother    Cancer Maternal Grandmother    ADD / ADHD Brother    Anxiety disorder Brother    Depression Brother    Learning disabilities Brother    Obesity Brother    Heart disease Brother    Past Surgical History:  Procedure Laterality Date   CESAREAN SECTION  2014   CESAREAN SECTION N/A 03/04/2018   Procedure: Repeat CESAREAN SECTION;  Surgeon: Linnell Devere BRAVO, MD;  Location: [facility];  Service: Obstetrics;  Laterality: N/A;  EDD: 03/10/18 Allergy: Aspirin, Shellfish   CHOLECYSTECTOMY     LAPAROSCOPY  2012   diagnose endometriosis    TONSILLECTOMY     Social History[1] Social History   Social History Narrative   Lives at home with husband and son. She is currently expecting her second child.   Right-handed.   No caffeine use.     Immunization History  Administered Date(s) Administered   DTaP 01/07/1995, 03/02/1995, 04/27/1995, 06/05/1996, 03/24/2000   Fluzone Influenza virus vaccine,trivalent (IIV3), split virus 12/10/2016   HIB (PRP-OMP) 01/07/1995, 03/02/1995, 04/27/1995, 06/05/1996   HPV Quadrivalent 10/18/2006, 12/15/2006, 07/19/2007   Hepatitis A 10/18/2006, 07/19/2007   Hepatitis B 10/13/1994, 01/07/1995, 05/13/1995   IPV 01/07/1995, 03/02/1995, 04/27/1995, 03/24/2000   Influenza Nasal 07/19/2007, 08/26/2012   Influenza Split 06/25/2010, 07/28/2011   Influenza-Unspecified 07/01/2017, 06/20/2018, 07/11/2019, 07/21/2021, 07/27/2022, 07/07/2024   MMR 06/05/1996, 03/24/2000   Meningococcal Conjugate 10/18/2006, 08/21/2011   PFIZER(Purple Top)SARS-COV-2 Vaccination 11/03/2019, 11/21/2019, 07/26/2020   PPD Test 07/16/2020, 07/30/2020   Tdap 10/18/2006, 07/03/2013, 12/16/2017     Objective: Vital Signs: There were no vitals taken for this visit.   Physical Exam   Musculoskeletal Exam: ***  CDAI Exam: CDAI Score: -- Patient Global: --; Provider Global: -- Swollen: --; Tender: -- Joint Exam 11/30/2024   No joint exam has been documented for this visit   There is currently no information documented on the homunculus. Go to the Rheumatology activity and complete the homunculus joint exam.  Investigation: No additional findings.  Imaging: DG PAIN CLINIC C-ARM 1-60 MIN NO REPORT Result Date: 11/01/2024 Fluoro was used, but no Radiologist interpretation will be provided. Please refer to NOTES tab for provider progress note.   Recent Labs: Lab Results  Component Value Date   WBC 3.6 07/19/2024   HGB 14.0 07/19/2024   PLT 183 07/19/2024   NA 141 07/19/2024   K 4.2 07/19/2024   CL 105  07/19/2024   CO2 23 07/19/2024   GLUCOSE 81 07/19/2024   BUN 9 07/19/2024   CREATININE 0.71 07/19/2024   BILITOT 0.5 07/19/2024   ALKPHOS 69 07/19/2024   AST 14 07/19/2024   ALT 14 07/19/2024   PROT 6.6 07/19/2024   ALBUMIN 4.2 07/19/2024   CALCIUM 9.3 07/19/2024   GFRAA 148 05/01/2020   QFTBGOLDPLUS Negative 07/28/2021    Speciality Comments: No specialty comments available.  Procedures:  No procedures performed Allergies: Hydrocodone , Sertraline, Shellfish protein-containing drug products, Aspirin, Pollen extract, and Mold extract [trichophyton mentagrophyte]   Assessment / Plan:     Visit Diagnoses: Myofascial pain  Polyarthralgia  Sicca syndrome  Degenerative cervical disc  Thoracic spondylosis  Chronic bilateral low back pain without sciatica  Carpal tunnel syndrome on right  Chronic pain of both knees  Vitamin D  deficiency  Chronic pelvic pain in female  Anxiety  Seizure-like activity (HCC)  Herpes simplex vulvovaginitis  Bilateral occipital neuralgia  Chronic depression  Endometriosis  Family history of systemic lupus erythematosus (SLE) in mother  Family history of neoplasm of breast-mother  Orders: No orders of the defined types were placed in this encounter.  No orders of the defined types were placed in this encounter.   Face-to-face time spent with patient was *** minutes. Greater than 50% of time was spent in counseling and coordination of care.  Follow-Up Instructions: No follow-ups on file.   Waddell CHRISTELLA Craze, PA-C  Note - This record has been created using Dragon software.  Chart creation errors have been sought, but may not always  have been located. Such creation errors do not reflect on  the standard of medical care.     [1]  Social History Tobacco Use   Smoking status: Never    Passive exposure: Never   Smokeless tobacco: Never  Vaping Use   Vaping status: Never Used  Substance Use Topics   Alcohol use: Yes     Alcohol/week: 1.0 standard drink of alcohol    Types: 1 Glasses of wine per week    Comment: occ   Drug use: No   "

## 2024-11-21 ENCOUNTER — Telehealth: Admitting: Student in an Organized Health Care Education/Training Program

## 2024-11-22 ENCOUNTER — Telehealth: Admitting: Student in an Organized Health Care Education/Training Program

## 2024-11-30 ENCOUNTER — Ambulatory Visit: Admitting: Physician Assistant

## 2024-11-30 DIAGNOSIS — A6004 Herpesviral vulvovaginitis: Secondary | ICD-10-CM

## 2024-11-30 DIAGNOSIS — F419 Anxiety disorder, unspecified: Secondary | ICD-10-CM

## 2024-11-30 DIAGNOSIS — G5601 Carpal tunnel syndrome, right upper limb: Secondary | ICD-10-CM

## 2024-11-30 DIAGNOSIS — M47814 Spondylosis without myelopathy or radiculopathy, thoracic region: Secondary | ICD-10-CM

## 2024-11-30 DIAGNOSIS — Z8269 Family history of other diseases of the musculoskeletal system and connective tissue: Secondary | ICD-10-CM

## 2024-11-30 DIAGNOSIS — R569 Unspecified convulsions: Secondary | ICD-10-CM

## 2024-11-30 DIAGNOSIS — M3500 Sicca syndrome, unspecified: Secondary | ICD-10-CM

## 2024-11-30 DIAGNOSIS — M2550 Pain in unspecified joint: Secondary | ICD-10-CM

## 2024-11-30 DIAGNOSIS — G8929 Other chronic pain: Secondary | ICD-10-CM

## 2024-11-30 DIAGNOSIS — M5030 Other cervical disc degeneration, unspecified cervical region: Secondary | ICD-10-CM

## 2024-11-30 DIAGNOSIS — N809 Endometriosis, unspecified: Secondary | ICD-10-CM

## 2024-11-30 DIAGNOSIS — E559 Vitamin D deficiency, unspecified: Secondary | ICD-10-CM

## 2024-11-30 DIAGNOSIS — M7918 Myalgia, other site: Secondary | ICD-10-CM

## 2024-11-30 DIAGNOSIS — F32A Depression, unspecified: Secondary | ICD-10-CM

## 2024-11-30 DIAGNOSIS — M5481 Occipital neuralgia: Secondary | ICD-10-CM

## 2024-11-30 DIAGNOSIS — Z8489 Family history of other specified conditions: Secondary | ICD-10-CM
